# Patient Record
Sex: Male | Born: 1940 | Race: White | Hispanic: No | Marital: Married | Smoking: Former smoker
Health system: Southern US, Community
[De-identification: ages and names within clinical notes are randomized; demographics above are authoritative.]

## PROBLEM LIST (undated history)

## (undated) DIAGNOSIS — C801 Malignant (primary) neoplasm, unspecified: Secondary | ICD-10-CM

## (undated) DIAGNOSIS — I509 Heart failure, unspecified: Secondary | ICD-10-CM

## (undated) DIAGNOSIS — C679 Malignant neoplasm of bladder, unspecified: Secondary | ICD-10-CM

## (undated) DIAGNOSIS — J45909 Unspecified asthma, uncomplicated: Secondary | ICD-10-CM

## (undated) DIAGNOSIS — C4491 Basal cell carcinoma of skin, unspecified: Secondary | ICD-10-CM

## (undated) HISTORY — PX: OTHER SURGICAL HISTORY: SHX169

## (undated) HISTORY — DX: Basal cell carcinoma of skin, unspecified: C44.91

---

## 2008-12-20 DIAGNOSIS — C4491 Basal cell carcinoma of skin, unspecified: Secondary | ICD-10-CM

## 2008-12-20 HISTORY — DX: Basal cell carcinoma of skin, unspecified: C44.91

## 2013-10-02 ENCOUNTER — Ambulatory Visit: Payer: Self-pay | Admitting: Gastroenterology

## 2013-10-04 LAB — PATHOLOGY REPORT

## 2014-04-19 ENCOUNTER — Emergency Department: Payer: Self-pay | Admitting: Emergency Medicine

## 2014-04-19 LAB — BASIC METABOLIC PANEL
ANION GAP: 8 (ref 7–16)
BUN: 15 mg/dL (ref 7–18)
CHLORIDE: 105 mmol/L (ref 98–107)
Calcium, Total: 8.2 mg/dL — ABNORMAL LOW (ref 8.5–10.1)
Co2: 22 mmol/L (ref 21–32)
Creatinine: 0.99 mg/dL (ref 0.60–1.30)
EGFR (African American): >60
Glucose: 150 mg/dL — ABNORMAL HIGH (ref 65–99)
Osmolality: 274 (ref 275–301)
Potassium: 3.9 mmol/L (ref 3.5–5.1)
Sodium: 135 mmol/L — ABNORMAL LOW (ref 136–145)

## 2014-04-19 LAB — CBC WITH DIFFERENTIAL/PLATELET
Basophil #: 0.1 10*3/uL (ref 0.0–0.1)
Basophil %: 1.1 %
EOS PCT: 0.8 %
Eosinophil #: 0.1 10*3/uL (ref 0.0–0.7)
HCT: 44.3 % (ref 40.0–52.0)
HGB: 14.3 g/dL (ref 13.0–18.0)
Lymphocyte #: 1.1 10*3/uL (ref 1.0–3.6)
Lymphocyte %: 14.7 %
MCH: 33.1 pg (ref 26.0–34.0)
MCHC: 32.3 g/dL (ref 32.0–36.0)
MCV: 103 fL — AB (ref 80–100)
Monocyte #: 0.4 x10 3/mm (ref 0.2–1.0)
Monocyte %: 5.3 %
NEUTROS PCT: 78.1 %
Neutrophil #: 5.7 10*3/uL (ref 1.4–6.5)
Platelet: 156 10*3/uL (ref 150–440)
RBC: 4.33 10*6/uL — AB (ref 4.40–5.90)
RDW: 13.2 % (ref 11.5–14.5)
WBC: 7.4 10*3/uL (ref 3.8–10.6)

## 2015-08-20 ENCOUNTER — Observation Stay: Payer: Medicare HMO

## 2015-08-20 ENCOUNTER — Inpatient Hospital Stay
Admission: EM | Admit: 2015-08-20 | Discharge: 2015-08-22 | DRG: 064 | Disposition: A | Discharge status: Home / Self Care | Payer: Medicare HMO | Attending: Internal Medicine | Admitting: Internal Medicine

## 2015-08-20 ENCOUNTER — Emergency Department: Payer: Medicare HMO

## 2015-08-20 ENCOUNTER — Encounter: Payer: Self-pay | Admitting: Emergency Medicine

## 2015-08-20 DIAGNOSIS — R410 Disorientation, unspecified: Secondary | ICD-10-CM

## 2015-08-20 DIAGNOSIS — Z87891 Personal history of nicotine dependence: Secondary | ICD-10-CM

## 2015-08-20 DIAGNOSIS — I5189 Other ill-defined heart diseases: Secondary | ICD-10-CM | POA: Diagnosis present

## 2015-08-20 DIAGNOSIS — I10 Essential (primary) hypertension: Secondary | ICD-10-CM | POA: Diagnosis present

## 2015-08-20 DIAGNOSIS — G9340 Encephalopathy, unspecified: Secondary | ICD-10-CM | POA: Diagnosis present

## 2015-08-20 DIAGNOSIS — G459 Transient cerebral ischemic attack, unspecified: Secondary | ICD-10-CM

## 2015-08-20 DIAGNOSIS — I639 Cerebral infarction, unspecified: Secondary | ICD-10-CM | POA: Diagnosis not present

## 2015-08-20 DIAGNOSIS — I63412 Cerebral infarction due to embolism of left middle cerebral artery: Principal | ICD-10-CM | POA: Diagnosis present

## 2015-08-20 DIAGNOSIS — R29701 NIHSS score 1: Secondary | ICD-10-CM | POA: Diagnosis present

## 2015-08-20 DIAGNOSIS — Z85828 Personal history of other malignant neoplasm of skin: Secondary | ICD-10-CM

## 2015-08-20 DIAGNOSIS — I447 Left bundle-branch block, unspecified: Secondary | ICD-10-CM | POA: Diagnosis present

## 2015-08-20 DIAGNOSIS — Z7982 Long term (current) use of aspirin: Secondary | ICD-10-CM

## 2015-08-20 DIAGNOSIS — E785 Hyperlipidemia, unspecified: Secondary | ICD-10-CM | POA: Diagnosis present

## 2015-08-20 DIAGNOSIS — R4701 Aphasia: Secondary | ICD-10-CM | POA: Diagnosis not present

## 2015-08-20 DIAGNOSIS — I493 Ventricular premature depolarization: Secondary | ICD-10-CM | POA: Diagnosis present

## 2015-08-20 HISTORY — DX: Unspecified asthma, uncomplicated: J45.909

## 2015-08-20 HISTORY — DX: Malignant (primary) neoplasm, unspecified: C80.1

## 2015-08-20 LAB — COMPREHENSIVE METABOLIC PANEL
ALBUMIN: 4.2 g/dL (ref 3.5–5.0)
ALT: 18 U/L (ref 17–63)
AST: 29 U/L (ref 15–41)
Alkaline Phosphatase: 45 U/L (ref 38–126)
Anion gap: 5 (ref 5–15)
BUN: 17 mg/dL (ref 6–20)
CHLORIDE: 105 mmol/L (ref 101–111)
CO2: 28 mmol/L (ref 22–32)
CREATININE: 0.93 mg/dL (ref 0.61–1.24)
Calcium: 8.8 mg/dL — ABNORMAL LOW (ref 8.9–10.3)
GFR calc Af Amer: >60 mL/min (ref >60)
GFR calc non Af Amer: >60 mL/min (ref >60)
Glucose, Bld: 96 mg/dL (ref 65–99)
Potassium: 4.1 mmol/L (ref 3.5–5.1)
SODIUM: 138 mmol/L (ref 135–145)
TOTAL PROTEIN: 7.7 g/dL (ref 6.5–8.1)
Total Bilirubin: 1 mg/dL (ref 0.3–1.2)

## 2015-08-20 LAB — GLUCOSE, CAPILLARY: Glucose-Capillary: 105 mg/dL — ABNORMAL HIGH (ref 65–99)

## 2015-08-20 LAB — CBC
HEMATOCRIT: 41.7 % (ref 40.0–52.0)
Hemoglobin: 13.9 g/dL (ref 13.0–18.0)
MCH: 32.7 pg (ref 26.0–34.0)
MCHC: 33.4 g/dL (ref 32.0–36.0)
MCV: 98 fL (ref 80.0–100.0)
PLATELETS: 145 10*3/uL — AB (ref 150–440)
RBC: 4.26 MIL/uL — ABNORMAL LOW (ref 4.40–5.90)
RDW: 14.2 % (ref 11.5–14.5)
WBC: 8.2 10*3/uL (ref 3.8–10.6)

## 2015-08-20 LAB — DIFFERENTIAL
BASOS ABS: 0.1 10*3/uL (ref 0–0.1)
BASOS PCT: 1 %
Eosinophils Absolute: 0.2 10*3/uL (ref 0–0.7)
Eosinophils Relative: 3 %
Lymphocytes Relative: 7 %
Lymphs Abs: 0.5 10*3/uL — ABNORMAL LOW (ref 1.0–3.6)
Monocytes Absolute: 0.7 10*3/uL (ref 0.2–1.0)
Monocytes Relative: 8 %
NEUTROS ABS: 6.7 10*3/uL — AB (ref 1.4–6.5)
NEUTROS PCT: 81 %

## 2015-08-20 LAB — PROTIME-INR
INR: 1.06
PROTHROMBIN TIME: 14 s (ref 11.4–15.0)

## 2015-08-20 LAB — TROPONIN I: Troponin I: <0.03 ng/mL (ref <0.031)

## 2015-08-20 LAB — APTT: APTT: 26 s (ref 24–36)

## 2015-08-20 MED ORDER — ASPIRIN 300 MG RE SUPP: 300.0000 mg | Freq: Every day | RECTAL | Status: DC

## 2015-08-20 MED ORDER — ASPIRIN 325 MG PO TABS
325.0000 mg | ORAL_TABLET | Freq: Every day | ORAL | Status: DC
  Administered 2015-08-20 – 2015-08-22 (×3): 325 mg via ORAL
  Filled 2015-08-20 (×3): qty 1

## 2015-08-20 MED ORDER — STROKE: EARLY STAGES OF RECOVERY BOOK
Freq: Once | Status: AC
  Administered 2015-08-20: 22:00:00

## 2015-08-20 MED ORDER — ACETAMINOPHEN 325 MG PO TABS
650.0000 mg | ORAL_TABLET | Freq: Four times a day (QID) | ORAL | Status: DC | PRN
  Administered 2015-08-20 – 2015-08-22 (×4): 650 mg via ORAL
  Filled 2015-08-20 (×4): qty 2

## 2015-08-20 MED ORDER — ENOXAPARIN SODIUM 40 MG/0.4ML ~~LOC~~ SOLN
40.0000 mg | SUBCUTANEOUS | Status: DC
  Administered 2015-08-20 – 2015-08-21 (×2): 40 mg via SUBCUTANEOUS
  Filled 2015-08-20 (×2): qty 0.4

## 2015-08-20 MED ORDER — SENNOSIDES-DOCUSATE SODIUM 8.6-50 MG PO TABS: 1.0000 | ORAL_TABLET | Freq: Every evening | ORAL | Status: DC | PRN

## 2015-08-20 MED ORDER — IOHEXOL 350 MG/ML SOLN
75.0000 mL | Freq: Once | INTRAVENOUS | Status: AC | PRN
  Administered 2015-08-20: 75 mL via INTRAVENOUS

## 2015-08-20 NOTE — ED Notes (Signed)
Wife reports when pharm tech asked if he was allergic to anything he kept telling her he did not want any medication.  This was only time he has not seemed to understand since arriving to hospital. md notified.

## 2015-08-20 NOTE — ED Notes (Signed)
Pt to ed with c/o confusion and disorientation since becoming dizzy and falling in [employer] at 1030 today.  Pt states he was standing looking at cards and then became dizzy and fell.  Pt reports he did not hit his head and did not lose consciousness but since has had confusion and disorientation.  Pt denies weakness, denies headache. Denies slurred speech.  Pt states "I feel dull in my brain"

## 2015-08-20 NOTE — ED Notes (Signed)
Pt arrived to room

## 2015-08-20 NOTE — ED Notes (Signed)
1925 to MRI via stretcher

## 2015-08-20 NOTE — ED Notes (Signed)
Neurology at bedside.

## 2015-08-20 NOTE — ED Notes (Signed)
Dr Doy Mince at bedside to update pt

## 2015-08-20 NOTE — ED Notes (Signed)
Returned from CTA with RN and monitor.

## 2015-08-20 NOTE — Consult Note (Signed)
Referring Physician: Burlene Arnt    Chief Complaint: Dizziness, difficulty with speech  HPI: Dalton Obrien is an 75 y.o. male who reports that he was in [employer] today and had the acute onset of dizziness.  He fell to the floor but did not hit his head.  He was able to check out of the store and drive home but noted when he was home that he could not read or sing his cards correctly.  His wife noted that he was confused as well.  Patient presented for evaluation at that time.  Date last known well: 08/20/2015 Time last known well: Time: 10:30 tPA Given: No: Improving symptoms  Past Medical History  Diagnosis Date  . Asthma   . 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 No pertinent past surgical history.  Family history: Mother deceased from breast cancer.  Had a history of migraines as well.  Brother with h/o DVT.  Son with migraines.  Social History:  reports that he has never smoked. He does not have any smokeless tobacco history on file. He reports that he drinks alcohol. He reports that he does not use illicit drugs.  Allergies: No Known Allergies  Medications: I have reviewed the patient's current medications. Prior to Admission:  Prior to Admission medications   ASA 81mg  daily    ROS: History obtained from the patient  General ROS: negative for - chills, fatigue, fever, night sweats, weight gain or weight loss Psychological ROS: negative for - behavioral disorder, hallucinations, memory difficulties, mood swings or suicidal ideation Ophthalmic ROS: negative for - blurry vision, double vision, eye pain or loss of vision ENT ROS: negative for - epistaxis, nasal discharge, oral lesions, sore throat, tinnitus or vertigo Allergy and Immunology ROS: negative for - hives or itchy/watery eyes Hematological and Lymphatic ROS: negative for - bleeding problems, bruising or swollen lymph  nodes Endocrine ROS: negative for - galactorrhea, hair pattern changes, polydipsia/polyuria or temperature intolerance Respiratory ROS: negative for - cough, hemoptysis, shortness of breath or wheezing Cardiovascular ROS: negative for - chest pain, dyspnea on exertion, edema or irregular heartbeat Gastrointestinal ROS: negative for - abdominal pain, diarrhea, hematemesis, nausea/vomiting or stool incontinence Genito-Urinary ROS: negative for - dysuria, hematuria, incontinence or urinary frequency/urgency Musculoskeletal ROS: negative for - joint swelling or muscular weakness Neurological ROS: as noted in HPI Dermatological ROS: negative for rash and skin lesion changes  Physical Examination: Blood pressure 108/80, pulse 78, temperature 98.2 F (36.8 C), temperature source Oral, resp. rate 20, height 6' (1.829 m), weight 81.647 kg (180 lb), SpO2 96 %.  HEENT-  Normocephalic, no lesions, without obvious abnormality.  Normal external eye and conjunctiva.  Normal TM's bilaterally.  Normal auditory canals and external ears. Normal external nose, mucus membranes and septum.  Normal pharynx. Cardiovascular- S1, S2 normal, pulses palpable throughout   Lungs- chest clear, no wheezing, rales, normal symmetric air entry Abdomen- soft, non-tender; bowel sounds normal; no masses,  no organomegaly Extremities- no edema Lymph-no adenopathy palpable Musculoskeletal-no joint tenderness, deformity or swelling Skin-warm and dry, no hyperpigmentation, vitiligo, or suspicious lesions  Neurological Examination Mental Status: Alert, oriented, thought content appropriate.  Speech with some word finding and paraphasic errors.  Able to follow 3 step commands without difficulty. Cranial Nerves: II: Discs flat bilaterally; Visual fields grossly normal, pupils equal, round, reactive to light and accommodation III,IV, VI: ptosis not present, extra-ocular motions intact  bilaterally V,VII: smile symmetric, facial light  touch sensation normal bilaterally VIII: hearing normal bilaterally IX,X: gag reflex present XI: bilateral shoulder shrug XII: midline tongue extension Motor: Right : Upper extremity   5/5    Left:     Upper extremity   5/5  Lower extremity   5/5     Lower extremity   5/5 Tone and bulk:normal tone throughout; no atrophy noted Sensory: Pinprick and light touch intact throughout, bilaterally Deep Tendon Reflexes: 2+ and symmetric with absent AJ's bilaterally Plantars: Right: equivocal   Left: upgoing Cerebellar: Normal finger-to-nose and normal heel-to-shin testing bilaterally Gait: guarded   Laboratory Studies:  Basic Metabolic Panel: No results for input(s): NA, K, CL, CO2, GLUCOSE, BUN, CREATININE, CALCIUM, MG, PHOS in the last 168 hours.  Liver Function Tests: No results for input(s): AST, ALT, ALKPHOS, BILITOT, PROT, ALBUMIN in the last 168 hours. No results for input(s): LIPASE, AMYLASE in the last 168 hours. No results for input(s): AMMONIA in the last 168 hours.  CBC:  Recent Labs Lab 08/20/15 1306  WBC 8.2  NEUTROABS 6.7*  HGB 13.9  HCT 41.7  MCV 98.0  PLT 145*    Cardiac Enzymes: No results for input(s): CKTOTAL, CKMB, CKMBINDEX, TROPONINI in the last 168 hours.  BNP: Invalid input(s): POCBNP  CBG:  Recent Labs Lab 08/20/15 [place]    Microbiology: No results found for this or any previous visit.  Coagulation Studies:  Recent Labs  08/20/15 1306  LABPROT 14.0  INR 1.06    Urinalysis: No results for input(s): COLORURINE, LABSPEC, PHURINE, GLUCOSEU, HGBUR, BILIRUBINUR, KETONESUR, PROTEINUR, UROBILINOGEN, NITRITE, LEUKOCYTESUR in the last 168 hours.  Invalid input(s): APPERANCEUR  Lipid Panel: No results found for: CHOL, TRIG, HDL, CHOLHDL, VLDL, LDLCALC  HgbA1C: No results found for: HGBA1C  Urine Drug Screen:  No results found for: LABOPIA, COCAINSCRNUR, LABBENZ, AMPHETMU, THCU, LABBARB  Alcohol Level: No results for  input(s): ETH in the last 168 hours.  Other results: EKG: sinus rhythm at 88 bpm, paired PVC's, prolonged PR interval.    Imaging: Ct Head Wo Contrast  08/20/2015  CLINICAL DATA:  Dizziness, confusion, altered mental status beginning this morning, fell prior to arrival, code stroke, initial encounter EXAM: CT HEAD WITHOUT CONTRAST TECHNIQUE: Contiguous axial images were obtained from the base of the skull through the vertex without intravenous contrast. COMPARISON:  04/19/2014 FINDINGS: Mild generalized age-related atrophy. Stable ventricular morphology. No midline shift or mass effect. Otherwise normal appearance of brain parenchyma. No intracranial hemorrhage, mass lesion or evidence acute infarction. No extra-axial fluid collections. Visualized paranasal sinuses and mastoid air cells clear. No acute osseous abnormalities. IMPRESSION: No acute intracranial abnormalities. Findings called to Dr. Corky Downs on 08/20/2015 at 1310 hours. Electronically Signed   By: Lavonia Dana M.D.   On: 08/20/2015 13:11    Assessment: 75 y.o. male presenting with aphasia and dizziness.  Dizziness resolved but patient continues to have some mild speech issues.  Symptoms resolving and therefore patient not a tPA candidate.  Head CT personally reviewed and shows no acute changes.  Patient on ASA at home.  Further work up recommended.    Stroke Risk Factors - hypertension  Plan: 1. HgbA1c, fasting lipid panel 2. MRI of the brain without contrast 3. PT consult, OT consult, Speech consult 4. Echocardiogram 5. CTA of head and neck.   6. Prophylactic therapy-Antiplatelet med: Aspirin - dose 325mg  daily 7. NPO until RN stroke swallow screen 8. Telemetry monitoring 9. Frequent neuro checks  Alexis Goodell, MD Neurology [phone] 08/20/2015, 1:45 PM

## 2015-08-20 NOTE — ED Notes (Signed)
Pt currently in CT. Stroke RN coordinator at bedside with primary rn waiting for pt.

## 2015-08-20 NOTE — Progress Notes (Signed)
Pt unable to follow commands and does not understand how to respond when questions are asked. Pt and pt wife refuses to let staff stay in room while pt is using urinal standing at bedside. Educated pt and pt wife about fall risk.

## 2015-08-20 NOTE — H&P (Signed)
[facility] [place] NAME: Dalton Obrien    MR#:  [id number]  DATE OF BIRTH:  [birth year]  DATE OF ADMISSION:  08/20/2015  PRIMARY CARE PHYSICIAN: Juluis Pitch, MD   REQUESTING/REFERRING PHYSICIAN: Dr. Burlene Arnt  CHIEF COMPLAINT:   Chief Complaint  Patient presents with  . Fall  . Altered Mental Status    HISTORY OF PRESENT ILLNESS:   Dalton Obrien  is a 75 y.o. male with no significant medical history who presents from home with confusion. Patient was in his usual state of health until 10:30AM when he [facility] to [facility] to buy [facility] card for his wife, while there he became unsteady and confused; he was able to drive home but confusion persisted, this was constant, moderate severity, associated with word finding difficulty; no alleviating factors. In ED he is noted to have some word finding difficulty as well as some inability to understand/interprete questions that are asked. He denies headaches, vision changes, paresthesias, focal extremity weakness.  He reports that that he was previously on a beta blocker but this was stopped due to baseline low/normal blood pressures; the medication was lowering his blood pressure further so it was discontinued.   PAST MEDICAL HISTORY:   Past Medical History  Diagnosis Date  . Asthma   . 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 No pertinent past surgical history.  SOCIAL HISTORY:   Social History  Substance Use Topics  . Smoking status: Former Research scientist (life sciences)  . Smokeless tobacco: Not on file     Comment: Quit in 1980  . Alcohol Use: 0.6 oz/week    1 Glasses of wine per week     Comment: 1-1.5 glasses of wine daily    FAMILY HISTORY:   Family History  Problem Relation Age of Onset  . Stroke Mother   . Stroke Maternal Uncle   . Seizures Father   . Hypertension Brother     DRUG ALLERGIES:   Allergies  Allergen Reactions  .  Penicillins Other (See Comments)    Urinary tract problems  . Pyridium [Phenazopyridine Hcl] Other (See Comments)    REVIEW OF SYSTEMS:   Review of Systems  Constitutional: Negative for fever, weight loss and malaise/fatigue.  HENT: Negative for congestion, hearing loss and tinnitus.   Eyes: Negative for blurred vision and double vision.  Respiratory: Negative for cough and shortness of breath.   Cardiovascular: Negative for chest pain and palpitations.  Gastrointestinal: Negative for nausea, vomiting and abdominal pain.  Genitourinary: Negative for dysuria, urgency and frequency.  Musculoskeletal: Positive for falls. Negative for myalgias, back pain and joint pain.  Skin: Negative for itching and rash.  Neurological: Positive for speech change. Negative for dizziness, tingling, tremors, sensory change, focal weakness, seizures, loss of consciousness and headaches.  Psychiatric/Behavioral: Negative for suicidal ideas and substance abuse. The patient does not have insomnia.     MEDICATIONS AT HOME:   Prior to Admission medications   Medication Sig Start Date End Date Taking? Authorizing Provider  aspirin EC 81 MG tablet Take 81 mg by mouth daily.   Yes Historical Provider, MD  desonide (DESOWEN) 0.05 % lotion Apply 1 application topically every other day.   Yes Historical Provider, MD      VITAL SIGNS:  Blood pressure 114/78, pulse 84, temperature 98.2 F (36.8 C), temperature source Oral, resp. rate 22, height 6' (1.829 m), weight 81.647 kg (  180 lb), SpO2 95 %.  PHYSICAL EXAMINATION:  Physical Exam  GENERAL:  75 y.o.-year-old patient lying in the bed with no acute distress.  EYES: Pupils equal, round, reactive to light and accommodation. No scleral icterus. Extraocular muscles intact.  HEENT: Head atraumatic, normocephalic. Oropharynx and nasopharynx clear. No oropharyngeal erythema, moist oral mucosa  NECK:  Supple, no jugular venous distention. No thyroid enlargement, no  tenderness.  LUNGS: Normal breath sounds bilaterally, no wheezing, rales, rhonchi. No use of accessory muscles of respiration.  CARDIOVASCULAR: S1, S2 normal. No murmurs, rubs, or gallops.  ABDOMEN: Soft, nontender, nondistended. Bowel sounds present. No organomegaly or mass.  EXTREMITIES: No pedal edema, cyanosis, or clubbing. + 2 pedal & radial pulses b/l.   NEUROLOGIC: Cranial nerves II through XII are intact. No focal Motor or sensory deficits appreciated b/l. Some word finding difficulty notes PSYCHIATRIC: The patient is alert and oriented x 3. Good affect.  SKIN: No obvious rash, lesion, or ulcer.   LABORATORY PANEL:   CBC  Recent Labs Lab 08/20/15 1306  WBC 8.2  HGB 13.9  HCT 41.7  PLT 145*   ------------------------------------------------------------------------------------------------------------------  Chemistries   Recent Labs Lab 08/20/15 1306  NA 138  K 4.1  CL 105  CO2 28  GLUCOSE 96  BUN 17  CREATININE 0.93  CALCIUM 8.8*  AST 29  ALT 18  ALKPHOS 45  BILITOT 1.0   ------------------------------------------------------------------------------------------------------------------  Cardiac Enzymes  Recent Labs Lab 08/20/15 1306  TROPONINI <0.03   ------------------------------------------------------------------------------------------------------------------  EKG viewed by me NSR: 88 bpm with PVCs  Telemetry viewed by me: NSR with intermittent PVCs  RADIOLOGY:   CT Angio Head and Neck 1. Atherosclerotic calcifications at the carotid bifurcations bilaterally, left greater than right. There is no significant associated stenosis. 2. Marked tortuosity of the cervical right ICA without significant stenosis. 3. Atherosclerotic changes within the cavernous internal carotid arteries bilaterally. 4. Moderate distal small vessel disease bilaterally without a significant proximal stenosis, aneurysm, or branch vessel occlusion  CT Head Wo  Contrast No acute intracranial abnormalities.  IMPRESSION AND PLAN:   75 year old male with no significant past medical history presenting with acute onset of encephalopathy, confusion, concerning for TIA, noted to have PVC on EKG  PLAN Appreciate neurology input, will check hemoglobin A1C, fasting lipid panel, MRI brain, echo and place patient on telemetry. PT/OT/ST consults Neurchecks Start ASA 325mg  If lipid uncontrolled start statin  Per wife, Dalton Obrien [phone]), patient has a living will, he will be FULL CODE during this hospitalization.  All the records are reviewed and case discussed with ED provider. Management plans discussed with the patient, family and they are in agreement.  CODE STATUS:  FULL CODE  TOTAL TIME TAKING CARE OF THIS PATIENT: 58 mies.    Samson Frederic D.O on 08/20/2015 at 4:02 PM  After 6pm go to [email or web address] - password [email or web address]  Office  [phone]  CC: Primary care physician; Juluis Pitch, MD   Note: This dictation was prepared with Dragon dictation along with smaller phrase technology. Any transcriptional errors that result from this process are unintentional.

## 2015-08-20 NOTE — ED Provider Notes (Signed)
[facility] Emergency Department Provider Note  ____________________________________________   I have reviewed the triage vital signs and the nursing notes.   HISTORY  Chief Complaint Fall and Altered Mental Status    HPI Dalton Obrien is a 75 y.o. 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  At around 10:30 this morning, patient went to the grocery store he was in his normal state of health at that time. Well the grocery store he began to "list" to one side and he actually very gradually let himself down on his hands and knees because he felt unsteady. He did not hit his head or pass out. Subsequent to that event, patient remained with a mildly unsteady gait and had word finding difficulties. For example, he accidentally put his name on the carpet for his wife. This was detected by him and his wife is being unusual, and he was brought to the emergency room. He was a code stroke here. He states he has had a great improvement in his symptoms and feels baseline or nearly so at this time. Denies any focal numbness or weakness denies headache denies stiff neck denies head trauma denies vomiting, denies change in vision,  History reviewed. No pertinent past medical history.  There are no active problems to display for this patient.   History reviewed. No pertinent past surgical history.  No current outpatient prescriptions on file.  Allergies Review of patient's allergies indicates no known allergies.  No family history on file.  Social History Social History  Substance Use Topics  . Smoking status: Never Smoker   . Smokeless tobacco: None  . Alcohol Use: Yes    Review of Systems Constitutional: No fever/chills Eyes: No visual  changes. ENT: No sore throat. No stiff neck no neck pain Cardiovascular: Denies chest pain. Respiratory: Denies shortness of breath. Gastrointestinal:   no vomiting.  No diarrhea.  No constipation. Genitourinary: Negative for dysuria. Musculoskeletal: Negative lower extremity swelling Skin: Negative for rash. Neurological: See history of present illness otherwise negative 10-point ROS otherwise negative.  ____________________________________________   PHYSICAL EXAM:  VITAL SIGNS: ED Triage Vitals  Enc Vitals Group     BP 08/20/15 1252 108/80 mmHg     Pulse Rate 08/20/15 1252 78     Resp 08/20/15 1252 20     Temp 08/20/15 1252 98.2 F (36.8 C)     Temp Source 08/20/15 1252 Oral     SpO2 08/20/15 1252 96 %     Weight 08/20/15 1252 180 lb (81.647 kg)     Height 08/20/15 1252 6' (1.829 m)     Head Cir --      Peak Flow --      Pain Score 08/20/15 1253 0     Pain Loc --      Pain Edu? --      Excl. in [place]? --     Constitutional: Alert and oriented. Well appearing . Laughing and joking with me in no acute medical distress Eyes: Conjunctivae are normal. PERRL. EOMI. Head: Atraumatic. Nose: No congestion/rhinnorhea. Mouth/Throat: Mucous membranes are moist.  Oropharynx non-erythematous. Neck: No stridor.   Nontender with no meningismus Cardiovascular: Normal rate, regular rhythm. Grossly normal heart sounds.  Good peripheral circulation. Respiratory: Normal respiratory effort.  No retractions. Lungs CTAB. Abdominal: Soft and nontender. No distention. No guarding no rebound Back:  There is no focal tenderness or step off there is no midline tenderness there are no lesions noted. there is no CVA tenderness Musculoskeletal: No lower extremity tenderness. No joint effusions, no DVT signs strong distal pulses no edema Neurologic:  Cranial nerves II through XII are grossly intact 5 out of 5 strength bilateral upper and lower extremity. Finger to nose within normal limits heel to shin  within normal limits, speech is normal with no word finding difficulty or dysarthria, reflexes symmetric, pupils are equally round and reactive to light, there is no pronator drift, sensation is normal, vision is intact to confrontation, gait is deferred, there is no nystagmus, normal neurologic exam, once or twice patient did pick the wrong words so the stroke scale from neurologist was a one. Skin:  Skin is warm, dry and intact. No rash noted. Psychiatric: Mood and affect are normal. Speech and behavior are normal.  ____________________________________________   LABS (all labs ordered are listed, but only abnormal results are displayed)  Labs Reviewed  CBC - Abnormal; Notable for the following:    RBC 4.26 (*)    Platelets 145 (*)    All other components within normal limits  DIFFERENTIAL - Abnormal; Notable for the following:    Neutro Abs 6.7 (*)    Lymphs Abs 0.5 (*)    All other components within normal limits  GLUCOSE, CAPILLARY - Abnormal; Notable for the following:    Glucose-Capillary 105 (*)    All other components within normal limits  PROTIME-INR  APTT  COMPREHENSIVE METABOLIC PANEL  TROPONIN I  CBG MONITORING, ED   ____________________________________________  EKG  I personally interpreted any EKGs ordered by me or triage Left bundle-branch branch block, rate 88 bpm, is normal sinus, PVCs noted, no acute ischemic changes ____________________________________________  RADIOLOGY  I reviewed any imaging ordered by me or triage that were performed during my shift ____________________________________________   PROCEDURES  Procedure(s) performed: None  Critical Care performed: None  ____________________________________________   INITIAL IMPRESSION / ASSESSMENT AND PLAN / ED COURSE  Pertinent labs & imaging results that were available during my care of the patient were reviewed by me and considered in my medical decision making (see chart for  details). Patient at this time has no evidence of cerebellar findings, he has no evidence of significant CVA. He did have occasional difficulty finding a word but by and large was very conversant. CT scan is negative. Neurologist at the bedside does not feel he is a candidate for TPA. I agree with this assessment. Very low NIH scale and rapidly resolving symptoms. Patient and family are very comfortable with this plan as well. We will obtain a CTA after renal function in consultation with our neurologist. Burnis Medin continue to observe the patient very closely. Very reassuring exam.  ____________________________________________   FINAL CLINICAL IMPRESSION(S) / ED DIAGNOSES  Final diagnoses:  Aphasia  Aphasia      This chart was dictated using voice recognition software.  Despite best efforts to proofread,  errors can occur which can change meaning.     Schuyler Amor, MD 08/20/15 1332

## 2015-08-21 ENCOUNTER — Observation Stay
Admit: 2015-08-21 | Discharge: 2015-08-21 | Disposition: A | Discharge status: Home / Self Care | Payer: Medicare HMO | Attending: Internal Medicine | Admitting: Internal Medicine

## 2015-08-21 ENCOUNTER — Observation Stay: Payer: Medicare HMO

## 2015-08-21 DIAGNOSIS — I10 Essential (primary) hypertension: Secondary | ICD-10-CM | POA: Diagnosis present

## 2015-08-21 DIAGNOSIS — E785 Hyperlipidemia, unspecified: Secondary | ICD-10-CM | POA: Diagnosis present

## 2015-08-21 DIAGNOSIS — Z87891 Personal history of nicotine dependence: Secondary | ICD-10-CM | POA: Diagnosis not present

## 2015-08-21 DIAGNOSIS — Z85828 Personal history of other malignant neoplasm of skin: Secondary | ICD-10-CM | POA: Diagnosis not present

## 2015-08-21 DIAGNOSIS — I447 Left bundle-branch block, unspecified: Secondary | ICD-10-CM | POA: Diagnosis present

## 2015-08-21 DIAGNOSIS — I639 Cerebral infarction, unspecified: Secondary | ICD-10-CM | POA: Diagnosis present

## 2015-08-21 DIAGNOSIS — G9340 Encephalopathy, unspecified: Secondary | ICD-10-CM | POA: Diagnosis present

## 2015-08-21 DIAGNOSIS — R29701 NIHSS score 1: Secondary | ICD-10-CM | POA: Diagnosis present

## 2015-08-21 DIAGNOSIS — Z7982 Long term (current) use of aspirin: Secondary | ICD-10-CM | POA: Diagnosis not present

## 2015-08-21 DIAGNOSIS — I63412 Cerebral infarction due to embolism of left middle cerebral artery: Principal | ICD-10-CM

## 2015-08-21 DIAGNOSIS — I5189 Other ill-defined heart diseases: Secondary | ICD-10-CM | POA: Diagnosis present

## 2015-08-21 DIAGNOSIS — R4701 Aphasia: Secondary | ICD-10-CM | POA: Diagnosis present

## 2015-08-21 DIAGNOSIS — I493 Ventricular premature depolarization: Secondary | ICD-10-CM | POA: Diagnosis present

## 2015-08-21 LAB — LIPID PANEL
CHOL/HDL RATIO: 3.6 ratio
Cholesterol: 165 mg/dL (ref 0–200)
HDL: 46 mg/dL (ref >40)
LDL Cholesterol: 110 mg/dL — ABNORMAL HIGH (ref 0–99)
Triglycerides: 44 mg/dL (ref <150)
VLDL: 9 mg/dL (ref 0–40)

## 2015-08-21 LAB — CBC
HEMATOCRIT: 39.5 % — AB (ref 40.0–52.0)
Hemoglobin: 13.4 g/dL (ref 13.0–18.0)
MCH: 33.2 pg (ref 26.0–34.0)
MCHC: 34 g/dL (ref 32.0–36.0)
MCV: 97.6 fL (ref 80.0–100.0)
PLATELETS: 118 10*3/uL — AB (ref 150–440)
RBC: 4.05 MIL/uL — ABNORMAL LOW (ref 4.40–5.90)
RDW: 14.2 % (ref 11.5–14.5)
WBC: 5.3 10*3/uL (ref 3.8–10.6)

## 2015-08-21 LAB — HEMOGLOBIN A1C: HEMOGLOBIN A1C: 4.9 % (ref 4.0–6.0)

## 2015-08-21 MED ORDER — ATORVASTATIN CALCIUM 20 MG PO TABS
20.0000 mg | ORAL_TABLET | Freq: Every day | ORAL | Status: DC
  Administered 2015-08-21: 17:00:00 20 mg via ORAL
  Filled 2015-08-21: qty 1

## 2015-08-21 NOTE — Plan of Care (Signed)
Problem: Self-Care: Goal: Ability to communicate needs accurately will improve Outcome: Not Progressing No change in speech or pt understanding when asked questions or to follow commands. Emotional support given, pt understands something "isn't quite right"

## 2015-08-21 NOTE — Progress Notes (Signed)
[place] at [facility]                                                                                                                                                                                            Patient Demographics   Dalton Obrien, is a 75 y.o. male, DOB - 04/11/1941, [id number]  Admit date - 08/20/2015   Admitting Physician Samson Frederic, DO  Outpatient Primary MD for the patient is Dalton Lovie Macadamia, MD   LOS -   Subjective: Patient having difficulty with his speech unable to express what he wants to say.     Review of Systems:   CONSTITUTIONAL: No documented fever. No fatigue, weakness. No weight gain, no weight loss.  EYES: No blurry or double vision.  ENT: No tinnitus. No postnasal drip. No redness of the oropharynx.  RESPIRATORY: No cough, no wheeze, no hemoptysis. No dyspnea.  CARDIOVASCULAR: No chest pain. No orthopnea. No palpitations. No syncope.  GASTROINTESTINAL: No nausea, no vomiting or diarrhea. No abdominal pain. No melena or hematochezia.  GENITOURINARY: No dysuria or hematuria.  ENDOCRINE: No polyuria or nocturia. No heat or cold intolerance.  HEMATOLOGY: No anemia. No bruising. No bleeding.  INTEGUMENTARY: No rashes. No lesions.  MUSCULOSKELETAL: No arthritis. No swelling. No gout.  NEUROLOGIC: Having expressive aphasia PSYCHIATRIC: No anxiety. No insomnia. No ADD.    Vitals:   Filed Vitals:   08/21/15 0154 08/21/15 0410 08/21/15 0520 08/21/15 0530  BP: 97/56 100/60 86/51 94/62   Pulse: 77 78 73   Temp: 98.4 F (36.9 C) 98.5 F (36.9 C) 99 F (37.2 C)   TempSrc: Oral Oral Oral   Resp: 18 20 19    Height:      Weight:      SpO2: 93% 94% 91%     Wt Readings from Last 3 Encounters:  08/20/15 85.231 kg (187 lb 14.4 oz)     Intake/Output Summary (Last 24 hours) at 08/21/15 1310 Last data filed at 08/21/15 1246  Gross per 24 hour  Intake    240 ml  Output    400 ml  Net   -160 ml     Physical Exam:   GENERAL: Pleasant-appearing in no apparent distress.  HEAD, EYES, EARS, NOSE AND THROAT: Atraumatic, normocephalic. Extraocular muscles are intact. Pupils equal and reactive to light. Sclerae anicteric. No conjunctival injection. No oro-pharyngeal erythema.  NECK: Supple. There is no jugular venous distention. No bruits, no lymphadenopathy, no thyromegaly.  HEART: Regular rate and rhythm,. No murmurs, no rubs, no clicks.  LUNGS: Clear to auscultation bilaterally. No rales or rhonchi. No wheezes.  ABDOMEN: Soft, flat, nontender, nondistended. Has good bowel sounds. No hepatosplenomegaly appreciated.  EXTREMITIES: No evidence of any cyanosis, clubbing, or peripheral edema.  +2 pedal and radial pulses bilaterally.  NEUROLOGIC: The patient is alert, awake, and oriented x3 with no focal motor or sensory deficits appreciated bilaterally. Having expressive aphasia SKIN: Moist and warm with no rashes appreciated.  Psych: Not anxious, depressed LN: No inguinal LN enlargement    Antibiotics   Anti-infectives    None      Medications   Scheduled Meds: . aspirin  300 mg Rectal Daily   Or  . aspirin  325 mg Oral Daily  . enoxaparin (LOVENOX) injection  40 mg Subcutaneous Q24H   Continuous Infusions:  PRN Meds:.acetaminophen, senna-docusate   Data Review:   Micro Results No results found for this or any previous visit (from the past 240 hour(s)).  Radiology Reports Ct Angio Head W/cm &/or Wo Cm  08/20/2015  CLINICAL DATA:  Unable to remember words or form sentences since falling today. The patient reports suddenly feeling like he was falling to 1 side this morning. He was able to gently gait to the ground without significant trauma. EXAM: CT ANGIOGRAPHY HEAD AND NECK TECHNIQUE: Multidetector CT imaging of the head and neck was performed using the standard protocol during bolus administration of intravenous contrast. Multiplanar CT image reconstructions and MIPs were  obtained to evaluate the vascular anatomy. Carotid stenosis measurements (when applicable) are obtained utilizing NASCET criteria, using the distal internal carotid diameter as the denominator. CONTRAST:  63mL OMNIPAQUE IOHEXOL 350 MG/ML SOLN COMPARISON:  CT head without contrast from the same day. CT head without contrast 04/19/2014 FINDINGS: CT HEAD Brain: The source images demonstrate no acute infarct. The basal ganglia are intact. The insular ribbon is normal. No focal cortical lesions are present. The posterior fossa structures are within normal limits. Calvarium and skull base: Within normal limits Paranasal sinuses: Mild mucosal thickening is present posteriorly in the left maxillary sinus. The paranasal sinuses and mastoid air cells are otherwise clear. Orbits: Negative CTA NECK Aortic arch: A 3 vessel arch configuration is present. Atherosclerotic changes are present without a significant stenosis at the origins of the great vessels. Right carotid system: The right common carotid artery is within normal limits. Minimal atherosclerotic changes are present at the right carotid bifurcation. There is proximal artifact across the internal carotid artery without significant stenosis. Moderate tortuosity is present in the cervical right ICA without significant stenosis below the skullbase. Left carotid system: The left common carotid artery is within normal limits. There is some artifact from hyperdense contrast in the adjacent left internal jugular vein. More prominent left-sided atherosclerotic changes are present at the carotid bifurcation without a significant stenosis. The cervical left ICA is within normal limits focal low the skullbase. Vertebral arteries:The vertebral arteries both originate from the subclavian arteries without significant stenosis. The left vertebral artery is the dominant vessel. There is no significant stenosis of either vertebral artery within the neck. Skeleton: Mild multilevel  endplate degenerative change is present. Uncovertebral disease and facet hypertrophy is worse right than left the multiple levels. There is moderate to severe right-sided foraminal narrowing at C2-3, C3-4, and C4-5. Vertebral body heights and alignment are maintained. No focal lytic or blastic lesions are present. Other neck: The soft tissues the neck are otherwise unremarkable. The vocal cords are midline and symmetric. No focal mucosal or submucosal lesions are present. The tongue base is within normal limits. The salivary glands are within normal  limits bilaterally. The thyroid is unremarkable. No significant adenopathy is present. The lung apices demonstrate mild dependent atelectasis. CTA HEAD Anterior circulation: Atherosclerotic calcifications are present within the cavernous internal carotid arteries bilaterally. The A1 and M1 segments are normal. Are within normal limits. The anterior communicating artery is patent. The MCA bifurcations are intact bilaterally. There is moderate attenuation of distal MCA and ACA branch vessels bilaterally Posterior circulation: The left vertebral artery is the dominant vessel. The PICA origins are visualized and normal. The vertebrobasilar junction is intact. The basilar artery is normal. Both posterior cerebral arteries originate from the basilar tip. The posterior cerebral arteries demonstrate mild segmental irregularity, more prominent distally. Venous sinuses: The dural sinuses are patent. The right transverse sinus is dominant. The straight sinus and deep cerebral veins are intact. The cortical veins are unremarkable. Anatomic variants: None. Delayed phase: The postcontrast images demonstrate no pathologic enhancement. IMPRESSION: 1. Atherosclerotic calcifications at the carotid bifurcations bilaterally, left greater than right. There is no significant associated stenosis. 2. Marked tortuosity of the cervical right ICA without significant stenosis. 3. Atherosclerotic  changes within the cavernous internal carotid arteries bilaterally. 4. Moderate distal small vessel disease bilaterally without a significant proximal stenosis, aneurysm, or branch vessel occlusion. Electronically Signed   By: San Morelle M.D.   On: 08/20/2015 14:23   Ct Head Wo Contrast  08/20/2015  CLINICAL DATA:  Dizziness, confusion, altered mental status beginning this morning, fell prior to arrival, code stroke, initial encounter EXAM: CT HEAD WITHOUT CONTRAST TECHNIQUE: Contiguous axial images were obtained from the base of the skull through the vertex without intravenous contrast. COMPARISON:  04/19/2014 FINDINGS: Mild generalized age-related atrophy. Stable ventricular morphology. No midline shift or mass effect. Otherwise normal appearance of brain parenchyma. No intracranial hemorrhage, mass lesion or evidence acute infarction. No extra-axial fluid collections. Visualized paranasal sinuses and mastoid air cells clear. No acute osseous abnormalities. IMPRESSION: No acute intracranial abnormalities. Findings called to Dr. Corky Downs on 08/20/2015 at 1310 hours. Electronically Signed   By: Lavonia Dana M.D.   On: 08/20/2015 13:11   Ct Angio Neck W/cm &/or Wo/cm  08/20/2015  CLINICAL DATA:  Unable to remember words or form sentences since falling today. The patient reports suddenly feeling like he was falling to 1 side this morning. He was able to gently gait to the ground without significant trauma. EXAM: CT ANGIOGRAPHY HEAD AND NECK TECHNIQUE: Multidetector CT imaging of the head and neck was performed using the standard protocol during bolus administration of intravenous contrast. Multiplanar CT image reconstructions and MIPs were obtained to evaluate the vascular anatomy. Carotid stenosis measurements (when applicable) are obtained utilizing NASCET criteria, using the distal internal carotid diameter as the denominator. CONTRAST:  74mL OMNIPAQUE IOHEXOL 350 MG/ML SOLN COMPARISON:  CT head  without contrast from the same day. CT head without contrast 04/19/2014 FINDINGS: CT HEAD Brain: The source images demonstrate no acute infarct. The basal ganglia are intact. The insular ribbon is normal. No focal cortical lesions are present. The posterior fossa structures are within normal limits. Calvarium and skull base: Within normal limits Paranasal sinuses: Mild mucosal thickening is present posteriorly in the left maxillary sinus. The paranasal sinuses and mastoid air cells are otherwise clear. Orbits: Negative CTA NECK Aortic arch: A 3 vessel arch configuration is present. Atherosclerotic changes are present without a significant stenosis at the origins of the great vessels. Right carotid system: The right common carotid artery is within normal limits. Minimal atherosclerotic changes are present at the right  carotid bifurcation. There is proximal artifact across the internal carotid artery without significant stenosis. Moderate tortuosity is present in the cervical right ICA without significant stenosis below the skullbase. Left carotid system: The left common carotid artery is within normal limits. There is some artifact from hyperdense contrast in the adjacent left internal jugular vein. More prominent left-sided atherosclerotic changes are present at the carotid bifurcation without a significant stenosis. The cervical left ICA is within normal limits focal low the skullbase. Vertebral arteries:The vertebral arteries both originate from the subclavian arteries without significant stenosis. The left vertebral artery is the dominant vessel. There is no significant stenosis of either vertebral artery within the neck. Skeleton: Mild multilevel endplate degenerative change is present. Uncovertebral disease and facet hypertrophy is worse right than left the multiple levels. There is moderate to severe right-sided foraminal narrowing at C2-3, C3-4, and C4-5. Vertebral body heights and alignment are maintained.  No focal lytic or blastic lesions are present. Other neck: The soft tissues the neck are otherwise unremarkable. The vocal cords are midline and symmetric. No focal mucosal or submucosal lesions are present. The tongue base is within normal limits. The salivary glands are within normal limits bilaterally. The thyroid is unremarkable. No significant adenopathy is present. The lung apices demonstrate mild dependent atelectasis. CTA HEAD Anterior circulation: Atherosclerotic calcifications are present within the cavernous internal carotid arteries bilaterally. The A1 and M1 segments are normal. Are within normal limits. The anterior communicating artery is patent. The MCA bifurcations are intact bilaterally. There is moderate attenuation of distal MCA and ACA branch vessels bilaterally Posterior circulation: The left vertebral artery is the dominant vessel. The PICA origins are visualized and normal. The vertebrobasilar junction is intact. The basilar artery is normal. Both posterior cerebral arteries originate from the basilar tip. The posterior cerebral arteries demonstrate mild segmental irregularity, more prominent distally. Venous sinuses: The dural sinuses are patent. The right transverse sinus is dominant. The straight sinus and deep cerebral veins are intact. The cortical veins are unremarkable. Anatomic variants: None. Delayed phase: The postcontrast images demonstrate no pathologic enhancement. IMPRESSION: 1. Atherosclerotic calcifications at the carotid bifurcations bilaterally, left greater than right. There is no significant associated stenosis. 2. Marked tortuosity of the cervical right ICA without significant stenosis. 3. Atherosclerotic changes within the cavernous internal carotid arteries bilaterally. 4. Moderate distal small vessel disease bilaterally without a significant proximal stenosis, aneurysm, or branch vessel occlusion. Electronically Signed   By: San Morelle M.D.   On: 08/20/2015  14:23   Mr Brain Wo Contrast  08/21/2015  CLINICAL DATA:  Acute onset confusion and unsteadiness with word-finding difficulty. EXAM: MRI HEAD WITHOUT CONTRAST TECHNIQUE: Multiplanar, multiecho pulse sequences of the brain and surrounding structures were obtained without intravenous contrast. COMPARISON:  Head CT and CTA 08/20/2015 FINDINGS: There is a moderate-sized acute left MCA territory infarct involving the superior temporal lobe and inferior parietal lobe including the posterior aspect of the operculum. This measures approximately 6 x 3 cm. A punctate focus of acute cortical infarction is noted more posteriorly in the left parieto-occipital region. There is associated cytotoxic edema without significant mass effect. No intracranial hemorrhage, mass, midline shift, or extra-axial fluid collection is seen. Ventricles and sulci are normal in size for age. Scattered, punctate foci of T2 hyperintensity in the cerebral white matter bilaterally are nonspecific but compatible with minimal chronic small vessel ischemic disease, less than is often seen in patients of this age. Orbits are unremarkable. Minimal left maxillary sinus and minimal left ethmoid  air cell mucosal thickening is noted. The mastoid air cells are clear. Major intracranial vascular flow voids are preserved. IMPRESSION: Moderate sized left temporoparietal MCA infarct. Electronically Signed   By: Logan Bores M.D.   On: 08/21/2015 10:58   US Carotid Bilateral  08/21/2015  CLINICAL DATA:  Confusion. EXAM: BILATERAL CAROTID DUPLEX ULTRASOUND TECHNIQUE: Pearline Cables scale imaging, color Doppler and duplex ultrasound were performed of bilateral carotid and vertebral arteries in the neck. COMPARISON:  None. FINDINGS: Criteria: Quantification of carotid stenosis is based on velocity parameters that correlate the residual internal carotid diameter with NASCET-based stenosis levels, using the diameter of the distal internal carotid lumen as the denominator for  stenosis measurement. The following velocity measurements were obtained: RIGHT ICA:  52/16 cm/sec CCA:  [id number] cm/sec SYSTOLIC ICA/CCA RATIO:  0.7 DIASTOLIC ICA/CCA RATIO:  0.9 ECA:  69 cm/sec LEFT ICA:  53/22 cm/sec CCA:  [id number] cm/sec SYSTOLIC ICA/CCA RATIO:  0.8 DIASTOLIC ICA/CCA RATIO:  1.1 ECA:  69 cm/sec RIGHT CAROTID ARTERY: No significant plaque or stenosis is noted in the right cervical carotid vessels. RIGHT VERTEBRAL ARTERY:  Antegrade flow is noted. LEFT CAROTID ARTERY: Moderate irregular calcified plaque is noted in the right carotid bulb and proximal right internal carotid artery consistent with less than 50% diameter stenosis based on ultrasound and Doppler criteria. LEFT VERTEBRAL ARTERY:  Antegrade flow is noted. IMPRESSION: No hemodynamically significant stenosis or plaque is noted in the right cervical carotid arteries. Moderate irregular calcified plaque is noted in the right carotid bulb and proximal right internal carotid artery consistent with less than 50% diameter stenosis based on ultrasound and Doppler criteria. Electronically Signed   By: Marijo Conception, M.D.   On: 08/21/2015 11:57     CBC  Recent Labs Lab 08/20/15 1306 08/21/15 0530  WBC 8.2 5.3  HGB 13.9 13.4  HCT 41.7 39.5*  PLT 145* 118*  MCV 98.0 97.6  MCH 32.7 33.2  MCHC 33.4 34.0  RDW 14.2 14.2  LYMPHSABS 0.5*  --   MONOABS 0.7  --   EOSABS 0.2  --   BASOSABS 0.1  --     Chemistries   Recent Labs Lab 08/20/15 1306  NA 138  K 4.1  CL 105  CO2 28  GLUCOSE 96  BUN 17  CREATININE 0.93  CALCIUM 8.8*  AST 29  ALT 18  ALKPHOS 45  BILITOT 1.0   ------------------------------------------------------------------------------------------------------------------ estimated creatinine clearance is 77.6 mL/min (by C-G formula based on Cr of 0.93). ------------------------------------------------------------------------------------------------------------------ No results for input(s): HGBA1C in the last  72 hours. ------------------------------------------------------------------------------------------------------------------  Recent Labs  08/21/15 0530  CHOL 165  HDL 46  LDLCALC 110*  TRIG 44  CHOLHDL 3.6   ------------------------------------------------------------------------------------------------------------------ No results for input(s): TSH, T4TOTAL, T3FREE, THYROIDAB in the last 72 hours.  Invalid input(s): FREET3 ------------------------------------------------------------------------------------------------------------------ No results for input(s): VITAMINB12, FOLATE, FERRITIN, TIBC, IRON, RETICCTPCT in the last 72 hours.  Coagulation profile  Recent Labs Lab 08/20/15 1306  INR 1.06    No results for input(s): DDIMER in the last 72 hours.  Cardiac Enzymes  Recent Labs Lab 08/20/15 1306  TROPONINI <0.03   ------------------------------------------------------------------------------------------------------------------ Invalid input(s): POCBNP    Assessment & Plan   Patient is a 75 year old white male with acute CVA  1. 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 Severe systolic dysfunction- no evidence of chf  3. Chronic LBBB  Code Status Orders        Start     Ordered   08/20/15 2142  Full code   Continuous     08/20/15 2141    Code Status History    Date Active Date Inactive Code Status Order ID Comments User Context   This patient has a current code status but no historical code status.    Advance Directive Documentation        Most Recent Value   Type of Advance Directive  Living will   Pre-existing out of facility DNR order (yellow form or pink MOST form)     "MOST" Form in Place?             Consults neurology  DVT Prophylaxis  Lovenox   Lab Results  Component Value Date   PLT 118* 08/21/2015      Time Spent in minutes   63min  Dustin Flock M.D on 08/21/2015 at 1:10 PM  Between 7am to 6pm - Pager - [phone]  After 6pm go to [email or web address] - password [email or web address] [facility]   Office  [phone]

## 2015-08-21 NOTE — Care Management Obs Status (Signed)
[facility] NOTIFICATION   Patient Details  Name: Dalton Obrien MRN: [id number] Date of Birth: 08/14/1940   Medicare Observation Status Notification Given:  Yes    Shelbie Ammons, RN 08/21/2015, 11:46 AM

## 2015-08-21 NOTE — Consult Note (Signed)
Referring Physician: Burlene Arnt    Chief Complaint: Dizziness, difficulty with speech  HPI: Dalton Obrien is an 75 y.o. male who reports that he was in [facility] today and had the acute onset of dizziness.  He fell to the floor but did not hit his head.  He was able to check out of the store and drive home but noted when he was home that he could not read or sing his cards correctly.  His wife noted that he was confused as well.  Patient presented for evaluation at that time.  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    Past Medical History  Diagnosis Date  . Asthma   . Cancer ([place])     Basal Cell on Face  Cardiomyopathy HTN in the past- no longer on medications Renal infarction DVT  History reviewed. No pertinent past surgical history.  Family history: Mother deceased from breast cancer.  Had a history of migraines as well.  Brother with h/o DVT.  Son with migraines.  Social History:  reports that he has quit smoking. He does not have any smokeless tobacco history on file. He reports that he drinks about 0.6 oz of alcohol per week. He reports that he does not use illicit drugs.  Allergies:  Allergies  Allergen Reactions  . Penicillins Other (See Comments)    Urinary tract problems  . Pyridium [Phenazopyridine Hcl] Other (See Comments)    Medications: I have reviewed the patient's current medications. Prior to Admission:  Prior to Admission medications   ASA 81mg  daily    ROS: History obtained from the patient  General ROS: negative for - chills, fatigue, fever, night sweats, weight gain or weight loss Psychological ROS: negative for - behavioral disorder, hallucinations, memory difficulties, mood swings or suicidal ideation Ophthalmic ROS: negative for - blurry vision, double vision, eye pain or loss of vision ENT ROS: negative  for - epistaxis, nasal discharge, oral lesions, sore throat, tinnitus or vertigo Allergy and Immunology ROS: negative for - hives or itchy/watery eyes Hematological and Lymphatic ROS: negative for - bleeding problems, bruising or swollen lymph nodes Endocrine ROS: negative for - galactorrhea, hair pattern changes, polydipsia/polyuria or temperature intolerance Respiratory ROS: negative for - cough, hemoptysis, shortness of breath or wheezing Cardiovascular ROS: negative for - chest pain, dyspnea on exertion, edema or irregular heartbeat Gastrointestinal ROS: negative for - abdominal pain, diarrhea, hematemesis, nausea/vomiting or stool incontinence Genito-Urinary ROS: negative for - dysuria, hematuria, incontinence or urinary frequency/urgency Musculoskeletal ROS: negative for - joint swelling or muscular weakness Neurological ROS: as noted in HPI Dermatological ROS: negative for rash and skin lesion changes  Physical Examination: Blood pressure 94/62, pulse 73, temperature 99 F (37.2 C), temperature source Oral, resp. rate 19, height 6' (1.829 m), weight 187 lb 14.4 oz (85.231 kg), SpO2 91 %.  HEENT-  Normocephalic, no lesions, without obvious abnormality.  Normal external eye and conjunctiva.  Normal TM's bilaterally.  Normal auditory canals and external ears. Normal external nose, mucus membranes and septum.  Normal pharynx. Cardiovascular- S1, S2 normal, pulses palpable throughout   Lungs- chest clear, no wheezing, rales, normal symmetric air entry Abdomen- soft, non-tender; bowel sounds normal; no masses,  no organomegaly Extremities- no edema Lymph-no adenopathy palpable Musculoskeletal-no joint tenderness, deformity or swelling Skin-warm and dry, no hyperpigmentation, vitiligo, or suspicious lesions  Neurological  Examination Mental Status: Alert, oriented, thought content appropriate.  Speech with some word finding and paraphasic errors.  Able to follow 3 step commands without  difficulty. Cranial Nerves: II: Discs flat bilaterally; Visual fields grossly normal, pupils equal, round, reactive to light and accommodation III,IV, VI: ptosis not present, extra-ocular motions intact bilaterally V,VII: smile symmetric, facial light touch sensation normal bilaterally VIII: hearing normal bilaterally IX,X: gag reflex present XI: bilateral shoulder shrug XII: midline tongue extension Motor: Right : Upper extremity   5/5    Left:     Upper extremity   5/5  Lower extremity   5/5     Lower extremity   5/5 Tone and bulk:normal tone throughout; no atrophy noted Sensory: Pinprick and light touch intact throughout, bilaterally Deep Tendon Reflexes: 2+ and symmetric with absent AJ's bilaterally Plantars: Right: equivocal   Left: upgoing Cerebellar: Normal finger-to-nose and normal heel-to-shin testing bilaterally Gait: guarded   Laboratory Studies:  Basic Metabolic Panel:  Recent Labs Lab 08/20/15 1306  NA 138  K 4.1  CL 105  CO2 28  GLUCOSE 96  BUN 17  CREATININE 0.93  CALCIUM 8.8*    Liver Function Tests:  Recent Labs Lab 08/20/15 1306  AST 29  ALT 18  ALKPHOS 45  BILITOT 1.0  PROT 7.7  ALBUMIN 4.2   No results for input(s): LIPASE, AMYLASE in the last 168 hours. No results for input(s): AMMONIA in the last 168 hours.  CBC:  Recent Labs Lab 08/20/15 1306 08/21/15 0530  WBC 8.2 5.3  NEUTROABS 6.7*  --   HGB 13.9 13.4  HCT 41.7 39.5*  MCV 98.0 97.6  PLT 145* 118*    Cardiac Enzymes:  Recent Labs Lab 08/20/15 1306  TROPONINI <0.03    BNP: Invalid input(s): POCBNP  CBG:  Recent Labs Lab 08/20/15 [place]    Microbiology: No results found for this or any previous visit.  Coagulation Studies:  Recent Labs  08/20/15 1306  LABPROT 14.0  INR 1.06    Urinalysis: No results for input(s): COLORURINE, LABSPEC, PHURINE, GLUCOSEU, HGBUR, BILIRUBINUR, KETONESUR, PROTEINUR, UROBILINOGEN, NITRITE, LEUKOCYTESUR in the  last 168 hours.  Invalid input(s): APPERANCEUR  Lipid Panel:    Component Value Date/Time   CHOL 165 08/21/2015 0530   TRIG 44 08/21/2015 0530   HDL 46 08/21/2015 0530   CHOLHDL 3.6 08/21/2015 0530   VLDL 9 08/21/2015 0530   LDLCALC 110* 08/21/2015 0530    HgbA1C: No results found for: HGBA1C  Urine Drug Screen:  No results found for: LABOPIA, COCAINSCRNUR, LABBENZ, AMPHETMU, THCU, LABBARB  Alcohol Level: No results for input(s): ETH in the last 168 hours.  Other results: EKG: sinus rhythm at 88 bpm, paired PVC's, prolonged PR interval.    Imaging: Ct Angio Head W/cm &/or Wo Cm  08/20/2015  CLINICAL DATA:  Unable to remember words or form sentences since falling today. The patient reports suddenly feeling like he was falling to 1 side this morning. He was able to gently gait to the ground without significant trauma. EXAM: CT ANGIOGRAPHY HEAD AND NECK TECHNIQUE: Multidetector CT imaging of the head and neck was performed using the standard protocol during bolus administration of intravenous contrast. Multiplanar CT image reconstructions and MIPs were obtained to evaluate the vascular anatomy. Carotid stenosis measurements (when applicable) are obtained utilizing NASCET criteria, using the distal internal carotid diameter as the denominator. CONTRAST:  26mL OMNIPAQUE IOHEXOL 350 MG/ML SOLN COMPARISON:  CT head without contrast from the same day.  CT head without contrast 04/19/2014 FINDINGS: CT HEAD Brain: The source images demonstrate no acute infarct. The basal ganglia are intact. The insular ribbon is normal. No focal cortical lesions are present. The posterior fossa structures are within normal limits. Calvarium and skull base: Within normal limits Paranasal sinuses: Mild mucosal thickening is present posteriorly in the left maxillary sinus. The paranasal sinuses and mastoid air cells are otherwise clear. Orbits: Negative CTA NECK Aortic arch: A 3 vessel arch configuration is present.  Atherosclerotic changes are present without a significant stenosis at the origins of the great vessels. Right carotid system: The right common carotid artery is within normal limits. Minimal atherosclerotic changes are present at the right carotid bifurcation. There is proximal artifact across the internal carotid artery without significant stenosis. Moderate tortuosity is present in the cervical right ICA without significant stenosis below the skullbase. Left carotid system: The left common carotid artery is within normal limits. There is some artifact from hyperdense contrast in the adjacent left internal jugular vein. More prominent left-sided atherosclerotic changes are present at the carotid bifurcation without a significant stenosis. The cervical left ICA is within normal limits focal low the skullbase. Vertebral arteries:The vertebral arteries both originate from the subclavian arteries without significant stenosis. The left vertebral artery is the dominant vessel. There is no significant stenosis of either vertebral artery within the neck. Skeleton: Mild multilevel endplate degenerative change is present. Uncovertebral disease and facet hypertrophy is worse right than left the multiple levels. There is moderate to severe right-sided foraminal narrowing at C2-3, C3-4, and C4-5. Vertebral body heights and alignment are maintained. No focal lytic or blastic lesions are present. Other neck: The soft tissues the neck are otherwise unremarkable. The vocal cords are midline and symmetric. No focal mucosal or submucosal lesions are present. The tongue base is within normal limits. The salivary glands are within normal limits bilaterally. The thyroid is unremarkable. No significant adenopathy is present. The lung apices demonstrate mild dependent atelectasis. CTA HEAD Anterior circulation: Atherosclerotic calcifications are present within the cavernous internal carotid arteries bilaterally. The A1 and M1 segments are  normal. Are within normal limits. The anterior communicating artery is patent. The MCA bifurcations are intact bilaterally. There is moderate attenuation of distal MCA and ACA branch vessels bilaterally Posterior circulation: The left vertebral artery is the dominant vessel. The PICA origins are visualized and normal. The vertebrobasilar junction is intact. The basilar artery is normal. Both posterior cerebral arteries originate from the basilar tip. The posterior cerebral arteries demonstrate mild segmental irregularity, more prominent distally. Venous sinuses: The dural sinuses are patent. The right transverse sinus is dominant. The straight sinus and deep cerebral veins are intact. The cortical veins are unremarkable. Anatomic variants: None. Delayed phase: The postcontrast images demonstrate no pathologic enhancement. IMPRESSION: 1. Atherosclerotic calcifications at the carotid bifurcations bilaterally, left greater than right. There is no significant associated stenosis. 2. Marked tortuosity of the cervical right ICA without significant stenosis. 3. Atherosclerotic changes within the cavernous internal carotid arteries bilaterally. 4. Moderate distal small vessel disease bilaterally without a significant proximal stenosis, aneurysm, or branch vessel occlusion. Electronically Signed   By: San Morelle M.D.   On: 08/20/2015 14:23   Ct Head Wo Contrast  08/20/2015  CLINICAL DATA:  Dizziness, confusion, altered mental status beginning this morning, fell prior to arrival, code stroke, initial encounter EXAM: CT HEAD WITHOUT CONTRAST TECHNIQUE: Contiguous axial images were obtained from the base of the skull through the vertex without intravenous contrast. COMPARISON:  04/19/2014 FINDINGS: Mild generalized age-related atrophy. Stable ventricular morphology. No midline shift or mass effect. Otherwise normal appearance of brain parenchyma. No intracranial hemorrhage, mass lesion or evidence acute infarction.  No extra-axial fluid collections. Visualized paranasal sinuses and mastoid air cells clear. No acute osseous abnormalities. IMPRESSION: No acute intracranial abnormalities. Findings called to Dr. Corky Downs on 08/20/2015 at 1310 hours. Electronically Signed   By: Lavonia Dana M.D.   On: 08/20/2015 13:11   Ct Angio Neck W/cm &/or Wo/cm  08/20/2015  CLINICAL DATA:  Unable to remember words or form sentences since falling today. The patient reports suddenly feeling like he was falling to 1 side this morning. He was able to gently gait to the ground without significant trauma. EXAM: CT ANGIOGRAPHY HEAD AND NECK TECHNIQUE: Multidetector CT imaging of the head and neck was performed using the standard protocol during bolus administration of intravenous contrast. Multiplanar CT image reconstructions and MIPs were obtained to evaluate the vascular anatomy. Carotid stenosis measurements (when applicable) are obtained utilizing NASCET criteria, using the distal internal carotid diameter as the denominator. CONTRAST:  65mL OMNIPAQUE IOHEXOL 350 MG/ML SOLN COMPARISON:  CT head without contrast from the same day. CT head without contrast 04/19/2014 FINDINGS: CT HEAD Brain: The source images demonstrate no acute infarct. The basal ganglia are intact. The insular ribbon is normal. No focal cortical lesions are present. The posterior fossa structures are within normal limits. Calvarium and skull base: Within normal limits Paranasal sinuses: Mild mucosal thickening is present posteriorly in the left maxillary sinus. The paranasal sinuses and mastoid air cells are otherwise clear. Orbits: Negative CTA NECK Aortic arch: A 3 vessel arch configuration is present. Atherosclerotic changes are present without a significant stenosis at the origins of the great vessels. Right carotid system: The right common carotid artery is within normal limits. Minimal atherosclerotic changes are present at the right carotid bifurcation. There is proximal  artifact across the internal carotid artery without significant stenosis. Moderate tortuosity is present in the cervical right ICA without significant stenosis below the skullbase. Left carotid system: The left common carotid artery is within normal limits. There is some artifact from hyperdense contrast in the adjacent left internal jugular vein. More prominent left-sided atherosclerotic changes are present at the carotid bifurcation without a significant stenosis. The cervical left ICA is within normal limits focal low the skullbase. Vertebral arteries:The vertebral arteries both originate from the subclavian arteries without significant stenosis. The left vertebral artery is the dominant vessel. There is no significant stenosis of either vertebral artery within the neck. Skeleton: Mild multilevel endplate degenerative change is present. Uncovertebral disease and facet hypertrophy is worse right than left the multiple levels. There is moderate to severe right-sided foraminal narrowing at C2-3, C3-4, and C4-5. Vertebral body heights and alignment are maintained. No focal lytic or blastic lesions are present. Other neck: The soft tissues the neck are otherwise unremarkable. The vocal cords are midline and symmetric. No focal mucosal or submucosal lesions are present. The tongue base is within normal limits. The salivary glands are within normal limits bilaterally. The thyroid is unremarkable. No significant adenopathy is present. The lung apices demonstrate mild dependent atelectasis. CTA HEAD Anterior circulation: Atherosclerotic calcifications are present within the cavernous internal carotid arteries bilaterally. The A1 and M1 segments are normal. Are within normal limits. The anterior communicating artery is patent. The MCA bifurcations are intact bilaterally. There is moderate attenuation of distal MCA and ACA branch vessels bilaterally Posterior circulation: The left vertebral artery is the  dominant vessel.  The PICA origins are visualized and normal. The vertebrobasilar junction is intact. The basilar artery is normal. Both posterior cerebral arteries originate from the basilar tip. The posterior cerebral arteries demonstrate mild segmental irregularity, more prominent distally. Venous sinuses: The dural sinuses are patent. The right transverse sinus is dominant. The straight sinus and deep cerebral veins are intact. The cortical veins are unremarkable. Anatomic variants: None. Delayed phase: The postcontrast images demonstrate no pathologic enhancement. IMPRESSION: 1. Atherosclerotic calcifications at the carotid bifurcations bilaterally, left greater than right. There is no significant associated stenosis. 2. Marked tortuosity of the cervical right ICA without significant stenosis. 3. Atherosclerotic changes within the cavernous internal carotid arteries bilaterally. 4. Moderate distal small vessel disease bilaterally without a significant proximal stenosis, aneurysm, or branch vessel occlusion. Electronically Signed   By: San Morelle M.D.   On: 08/20/2015 14:23   Mr Brain Wo Contrast  08/21/2015  CLINICAL DATA:  Acute onset confusion and unsteadiness with word-finding difficulty. EXAM: MRI HEAD WITHOUT CONTRAST TECHNIQUE: Multiplanar, multiecho pulse sequences of the brain and surrounding structures were obtained without intravenous contrast. COMPARISON:  Head CT and CTA 08/20/2015 FINDINGS: There is a moderate-sized acute left MCA territory infarct involving the superior temporal lobe and inferior parietal lobe including the posterior aspect of the operculum. This measures approximately 6 x 3 cm. A punctate focus of acute cortical infarction is noted more posteriorly in the left parieto-occipital region. There is associated cytotoxic edema without significant mass effect. No intracranial hemorrhage, mass, midline shift, or extra-axial fluid collection is seen. Ventricles and sulci are normal in size for  age. Scattered, punctate foci of T2 hyperintensity in the cerebral white matter bilaterally are nonspecific but compatible with minimal chronic small vessel ischemic disease, less than is often seen in patients of this age. Orbits are unremarkable. Minimal left maxillary sinus and minimal left ethmoid air cell mucosal thickening is noted. The mastoid air cells are clear. Major intracranial vascular flow voids are preserved. IMPRESSION: Moderate sized left temporoparietal MCA infarct. Electronically Signed   By: Logan Bores M.D.   On: 08/21/2015 10:58    Assessment: 75 y.o. male presenting with aphasia and dizziness.  Dizziness resolved but patient continues to have some mild speech issues.  Symptoms resolving and therefore patient not a tPA candidate.  Head CT personally reviewed and shows no acute changes.  Patient on ASA at home.   MRI L tempo/parietal infarct.    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 If the echo does show poor EF as stated by wife would start pt on anticoagulation in about 10-14 days due to large size of stroke. There is a good chance this is cardio embolic due to stasis in setting of poor EF.  - otherwise for now please continue ASA 325 daily.   - speech therapy - Pt/OT  Leotis Pain

## 2015-08-21 NOTE — Progress Notes (Signed)
PT Hold Note  Patient Details Name: PRANJAL MAYHEW MRN: [id number] DOB: 04/29/1941   Cancelled Treatment:    Reason Eval/Treat Not Completed: Patient at procedure or test/unavailable. Chart reviewed and RN consulted. Attempted to see pt at 11:00AM however he is out of room for testing. Will attempt at later time as pt is available.   Lyndel Safe Huprich PT, DPT   Huprich,Jason 08/21/2015, 12:25 PM

## 2015-08-21 NOTE — Progress Notes (Signed)
OT Cancellation Note  Patient Details Name: Dalton Obrien MRN: [id number] DOB: 03/02/1941   Cancelled Treatment:    Reason Eval/Treat Not Completed: Other (comment). Patient with speech. Will re-attempt time permitting.  Sharon Mt 08/21/2015, 2:10 PM

## 2015-08-21 NOTE — Progress Notes (Signed)
*  PRELIMINARY RESULTS* Echocardiogram 2D Echocardiogram has been performed.  Dalton Obrien 08/21/2015, 11:51 AM

## 2015-08-21 NOTE — Care Management (Signed)
Admitted to [facility] with the diagnosis of TIA. Lives with wife, Izora Gala [phone]). Last seen Dr. Lovie Macadamia a week ago. No home Health. No skilled facility. No Home oxygen. Uses no aids for ambulation. Takes care of both basic and instrumental activities of daily living himself.  Still drives. Fell yesterday. Good appetite. Family will transport. Shelbie Ammons RN MSN CCM Care Management [phone]

## 2015-08-21 NOTE — Evaluation (Signed)
Physical Therapy Evaluation Patient Details Name: Dalton Obrien MRN: [id number] DOB: [birth year] Today's Date: 08/21/2015   History of Present Illness  Dalton Obrien  is a 75 y.o. male with no significant medical history who presents from home with confusion. Patient was in his usual state of health until 10:30AM when he went to [employer] to buy Valentine's Day card for his wife, while there he became unsteady and confused; he was able to drive home but confusion persisted, this was constant, moderate severity, associated with word finding difficulty; no alleviating factors. In ED he is noted to have some word finding difficulty as well as some inability to understand/interprete questions that are asked. He denies headaches, vision changes, paresthesias, focal extremity weakness. He reports that that he was previously on a beta blocker but this was stopped due to baseline low/normal blood pressures; the medication was lowering his blood pressure further so it was discontinued. At time of evaluation pt presents with moderate receptive and expressive aphasia. Appears fully oriented but unable to answer AO questions. Difficult to obtain history  Clinical Impression  Pt with significant receptive and expressive aphasia which limits his ability to participate with components of PT exam. History obtained by wife over the phone. Pt demonstrates normal bed mobility, transfers, and ambulation on this date without assistive device. Strength appears full and functional however unable to perform MMT due to receptive aphasia. Pt appears completely oriented but again unable to answer orientation questions. He does know that he is in [place] and is able to tell me about some of the events surrounding his admission. Pt has no further PT needs at discharge. 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 Will keep pt on PT caseload temporarily in order to further assess high level balance and ensure he does not develop  expanding zone of infarct with concurrent motor deficits while admitted to [facility].     Follow Up Recommendations No PT follow up    Equipment Recommendations  None recommended by PT    Recommendations for Other Services       Precautions / Restrictions Precautions Precautions: Fall Restrictions Weight Bearing Restrictions: No      Mobility  Bed Mobility Overal bed mobility: Independent             General bed mobility comments: good speed, sequencing, and strength noted  Transfers Overall transfer level: Independent Equipment used: None             General transfer comment: No evidence for instability. Good speed and strength noted  Ambulation/Gait Ambulation/Gait assistance: Supervision Ambulation Distance (Feet): 250 Feet Assistive device: None Gait Pattern/deviations: WFL(Within Functional Limits) Gait velocity: WFL Gait velocity interpretation: >2.62 ft/sec, indicative of independent community ambulator General Gait Details: Pt with good speed and stability. good scanning of environment and able to perform head turns without LOB. No evidence for instability.   Stairs            Wheelchair Mobility    Modified Rankin (Stroke Patients Only)       Balance Overall balance assessment: No apparent balance deficits (not formally assessed) (difficult to fully assess due to receptive aphasia)                                           Pertinent Vitals/Pain Pain Assessment: No/denies pain    Home Living Family/patient expects to be discharged  to:: Private residence Living Arrangements: Spouse/significant other Available Help at Discharge: Family Type of Home: House Home Access: Stairs to enter Entrance Stairs-Rails: None Entrance Stairs-Number of Steps: 2 Home Layout: Multi-level;Able to live on main level with bedroom/bathroom Home Equipment: Shower seat - built in (no assistive device)      Prior Function Level of  Independence: Independent         Comments: Driving and independent with ADL/IADLs. Walked for exercise and played golf     Hand Dominance   Dominant Hand: Right    Extremity/Trunk Assessment   Upper Extremity Assessment: Overall WFL for tasks assessed (Poor command follow due to receptive aphasia)           Lower Extremity Assessment: Overall WFL for tasks assessed         Communication   Communication: Receptive difficulties;Expressive difficulties (Moderate receptive and expressive aphasia)  Cognition Arousal/Alertness: Awake/alert Behavior During Therapy: WFL for tasks assessed/performed Overall Cognitive Status: Difficult to assess                      General Comments      Exercises        Assessment/Plan    PT Assessment Patient needs continued PT services  PT Diagnosis Other (comment);Altered mental status;Difficulty walking (Acute L MCA CVA, Hx of fall)   PT Problem List Decreased balance;Other (comment);Decreased knowledge of use of DME (Hx of fall, Receptive/expressive aphasia)  PT Treatment Interventions DME instruction;Stair training;Functional mobility training;Therapeutic activities;Therapeutic exercise;Balance training;Neuromuscular re-education;Cognitive remediation;Patient/family education   PT Goals (Current goals can be found in the Care Plan section) Acute Rehab PT Goals Patient Stated Goal: Pt unable to provide answer due to receptive aphasia    Frequency 7X/week   Barriers to discharge        Co-evaluation               End of Session Equipment Utilized During Treatment: Gait belt Activity Tolerance: Patient tolerated treatment well Patient left: in bed;with bed alarm set;with call bell/phone within reach Nurse Communication: Mobility status         Time: [id number] PT Time Calculation (min) (ACUTE ONLY): 20 min   Charges:   PT Evaluation $PT Eval Low Complexity: 1 Procedure     PT G Codes:       Lyndel Safe Huprich PT, DPT   Huprich,Jason 08/21/2015, 3:37 PM

## 2015-08-21 NOTE — Evaluation (Signed)
Speech Language Pathology Evaluation Patient Details Name: Dalton Obrien MRN: [id number] DOB: June 17, 1941 Today's Date: 08/21/2015 Time: [id number] SLP Time Calculation (min) (ACUTE ONLY): 60 min  Problem List:  Patient Active Problem List   Diagnosis Date Noted  . CVA (cerebral infarction) 08/21/2015  . TIA (transient ischemic attack) 08/20/2015  . Acute encephalopathy 08/20/2015   Past Medical History:  Past Medical History  Diagnosis Date  . Asthma   . 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 No pertinent past surgical history. HPI:  Pt is a 75 y.o. male with no significant medical history who presents from home with confusion. Patient was in his usual state of health until 10:30AM when he went to [employer] to buy [employer] Day card for his wife, while there he became unsteady and confused; he was able to drive home but confusion persisted, this was constant, moderate severity, associated with word finding difficulty; no alleviating factors. In ED he is noted to have some word finding difficulty as well as some inability to understand/interprete questions that are asked. He denies headaches, vision changes, paresthesias, focal extremity weakness. He reports that that he was previously on a beta blocker but this was stopped due to baseline low/normal blood pressures; the medication was lowering his blood pressure further so it was discontinued. At time of evaluation pt presents with moderate receptive and expressive aphasia. Appears fully oriented but unable to answer AO questions. Difficult to obtain historyd/t communication deficits. Pt is somewhat aware of his communication deficits and frustrated by them. He has some insight asking when this could "get better". No observed or reported deficits w/ swallowing per NSG or wife/pt.  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 Pt's verbal expression was c/b fair fluency of speech w/ intermittent halting, paraphasias, and word-finding deficits. Pt was minimally aware of the paraphasias and his own mistakes in communication at times, then at other times, he appeared somehwhat ftrustrated attempting to fully communicate all he wanted to say. Comprehension of instruction, questions(Y/N), tasks, and steps of tasks was poor. Instead, he began to verbalize in response to the task or instruction. Although, he responded more appropriately and accurately when presented objects and communication was primarily by gestures vs verbal; noted follow-through w/ a task if he were handed objects and asked "what is this?". Pt could also model the function as it was shown to him(nonverbal communication). Pt appeared oriented to his setting and wife even was able to articulate the events surrounding the morning of the CVA. Pt would benefit from continued skilled ST services for speech-language therapy in order to enhance communication in ADLs; education w/ caregivers for support of communication strategies(ie. gestures) w/ pt's communication needs.     SLP Assessment  Patient needs continued Speech Lanaguage Pathology Services    Follow Up Recommendations  Outpatient SLP;Home health SLP (TBD)    Frequency and Duration min 3x week  2 weeks      SLP Evaluation Prior Functioning  Cognitive/Linguistic Baseline: Within functional limits Type of Home: House  Lives With: Spouse Available Help at Discharge: Family Education: college Vocation: Retired   Associate Professor  Overall Cognitive Status: Impaired/Different from baseline Arousal/Alertness: Awake/alert Orientation Level: Oriented X4 (w/ strategies) Attention: Focused;Sustained Focused Attention: Appears intact Sustained Attention: Appears intact Memory: Appears intact Awareness: Appears intact Problem Solving: Appears intact Executive Function:   (difficult  to fully assess sec. to the language deficits; wfl) Behaviors: Poor frustration tolerance;Perseveration    Comprehension  Auditory Comprehension Overall Auditory Comprehension: Impaired Yes/No Questions: Impaired (impaired) Commands: Impaired (impaired) Conversation:  (impaired) Other Conversation Comments: Wernicke's Aphasia Reading Comprehension Reading Status: Not tested    Expression Expression Primary Mode of Expression: Verbal Verbal Expression Overall Verbal Expression: Impaired Initiation: No impairment Automatic Speech: Counting;Day of week;Social Response Level of Generative/Spontaneous Verbalization: Sentence;Conversation Repetition: Impaired Naming: Impairment Confrontation: Impaired Other Naming Comments: Wernicke's Aphasia Verbal Errors: Perseveration;Phonemic paraphasias;Jargon;Not aware of errors Pragmatics: No impairment Effective Techniques:  (object cues) Non-Verbal Means of Communication: Gestures Other Verbal Expression Comments: Wernicke's Aphasia Written Expression Dominant Hand: Right Written Expression:  (impaired)   Oral / Motor  Oral Motor/Sensory Function Overall Oral Motor/Sensory Function: Within functional limits Motor Speech Overall Motor Speech: Appears within functional limits for tasks assessed Respiration: Within functional limits Phonation: Normal Resonance: Within functional limits Articulation: Within functional limitis Intelligibility: Intelligible Motor Planning: Witnin functional limits Motor Speech Errors: Not applicable   GO                    Angelita Harnack 08/21/2015, 4:14 PM

## 2015-08-21 NOTE — Evaluation (Signed)
Occupational Therapy Evaluation Patient Details Name: Dalton Obrien MRN: [id number] DOB: [birth year] Today's Date: 08/21/2015    History of Present Illness This patient is a 75 year old male who came to [facility] with confusion.  Dalton Obrien is a 75 y.o. male with no significant medical history who presents from home with confusion. Patient was in his usual state of health until 10:30AM when he went to [employer] to buy [employer] Day card for his wife, while there he became unsteady and confused; he was able to drive home but confusion persisted, this was constant, moderate severity, associated with word finding difficulty; no alleviating factors. In ED he is noted to have some word finding difficulty as well as some inability to understand/interpret questions that are asked. He denies headaches, vision changes, paresthesias, focal extremity weakness.   Clinical Impression   This patient is a 75 year old male who came to [facility] with the above history.  He lives with his wife in a 2 story home but able to stay on 1st floor.  He had been independent with all ADL and functional mobility. He now shows deficits in communication with expressive and receptive aphasia. This did interfere with some tests during the evaluation such as sensory. She showed that he can dress himself including tying shoes. He combed his hair. Would like a 2 session trial incase other deficits show up, otherwise do not see any Occupational Therapy needs at this time. He is getting speech.    Follow Up Recommendations  No OT follow up    Equipment Recommendations       Recommendations for Other Services       Precautions / Restrictions Precautions Precautions: Fall Restrictions Weight Bearing Restrictions: No      Mobility Bed Mobility Overal bed mobility: Independent               Transfers Overall transfer level: Independent Equipment used: None                 Balance                                           ADL                                         General ADL Comments: Had been independent with ADL.  He donned doffed shoe and tied it with normal speed and accuracy. He combed his hair when given a comb.       Vision     Perception     Praxis      Pertinent Vitals/Pain Pain Assessment: No/denies pain     Hand Dominance Right   Extremity/Trunk Assessment Upper Extremity Assessment Upper Extremity Assessment:  (B UE strength 5/5 through out, grip R 67 lbs, L 60 lbs.  9 hole peg test R 23 sec. L 27 sec. Patient did not understand sensory tests.)   Lower Extremity Assessment Lower Extremity Assessment: Defer to PT evaluation       Communication Communication Communication: Receptive difficulties;Expressive difficulties   Cognition Arousal/Alertness: Awake/alert Behavior During Therapy: WFL for tasks assessed/performed Overall Cognitive Status: Impaired/Different from baseline  General Comments       Exercises       Shoulder Instructions      Home Living Family/patient expects to be discharged to:: Private residence Living Arrangements: Spouse/significant other Available Help at Discharge: Family Type of Home: House Home Access: Stairs to enter [employer] of Steps: 2 Entrance Stairs-Rails: None Home Layout: Multi-level;Able to live on main level with bedroom/bathroom     Bathroom Shower/Tub: Astronomer Accessibility: Yes   Home Equipment: Shower seat - built in      Lives With: Spouse    Prior Functioning/Environment Level of Independence: Independent        Comments: Driving, walking, golfing.    OT Diagnosis: Other (comment) (expressive and receptive aphasia)   OT Problem List:  (aphasia)   OT Treatment/Interventions:      OT Goals(Current goals can be found in the care plan section) Acute Rehab OT Goals Patient  Stated Goal: unable secondary to aphasia  OT Frequency:     Barriers to D/C:            Co-evaluation              End of Session Equipment Utilized During Treatment:  (stroke test kit.)  Activity Tolerance:   Patient left: in bed;with call bell/phone within reach;with bed alarm set   Time: 5974-7185 OT Time Calculation (min): 18 min Charges:  OT General Charges $OT Visit: 1 Procedure OT Evaluation $OT Eval Low Complexity: 1 Procedure G-Codes:    Myrene Galas, MS/OTR/L  08/21/2015, 4:32 PM

## 2015-08-22 MED ORDER — ATORVASTATIN CALCIUM 20 MG PO TABS: 20.0000 mg | ORAL_TABLET | Freq: Every day | ORAL | Status: DC

## 2015-08-22 MED ORDER — ASPIRIN 325 MG PO TABS: 325.0000 mg | ORAL_TABLET | Freq: Every day | ORAL | Status: DC

## 2015-08-22 NOTE — Discharge Instructions (Signed)

## 2015-08-22 NOTE — Progress Notes (Signed)
Physical Therapy Treatment Patient Details Name: Dalton Obrien MRN: [id number] DOB: Sep 15, 1940 Today's Date: 08/22/2015    History of Present Illness This patient is a 75 year old male who came to [facility] with confusion.  Dalton Obrien is a 75 y.o. male with no significant medical history who presents from home with confusion. Patient was in his usual state of health until 10:30AM when he went to [employer] to buy [employer] Day card for his wife, while there he became unsteady and confused; he was able to drive home but confusion persisted, this was constant, moderate severity, associated with word finding difficulty; no alleviating factors. In ED he is noted to have some word finding difficulty as well as some inability to understand/interprete questions that are asked. He denies headaches, vision changes, paresthesias, focal extremity weakness.    PT Comments    Treatment session to assess patient. On this date pt able to participate with MMT and sensory testing. Strength is at least 4+/5 throughout and pt denies loss of sensation to light touch. Able to ambulate full lap around RN station independently. Pt able to perform head turns and scan visual environment without loss of balance. Negative Rhomberg and single leg balance is 6-8 seconds on each LEs. Did not perform BERG as pt able to complete highest level balance activities with minimal impairments. Overall aphasia appears to be mildly improved from yesterday. Pt follows commands better and performs object naming with increased accuracy. No further PT need identified. Will sign off. Pt should continue to work with speech therapy regarding receptive and expressive aphasia.    Follow Up Recommendations  No PT follow up     Equipment Recommendations  None recommended by PT    Recommendations for Other Services       Precautions / Restrictions Precautions Precautions: Fall Restrictions Weight Bearing Restrictions: No    Mobility  Bed  Mobility               General bed mobility comments: Received upright in recliner  Transfers Overall transfer level: Independent Equipment used: None             General transfer comment: No evidence for instability. Good speed and strength noted  Ambulation/Gait Ambulation/Gait assistance: Independent Ambulation Distance (Feet): 250 Feet Assistive device: None Gait Pattern/deviations: WFL(Within Functional Limits) Gait velocity: WFL Gait velocity interpretation: >2.62 ft/sec, indicative of independent community ambulator General Gait Details: Pt able to perform head turns without gait deviations. Good step length and overall speed noted. No evidence for instability. No DOE observed.   Stairs            Wheelchair Mobility    Modified Rankin (Stroke Patients Only)       Balance Overall balance assessment: No apparent balance deficits (not formally assessed) Sitting-balance support: No upper extremity supported Sitting balance-Leahy Scale: Normal     Standing balance support: No upper extremity supported Standing balance-Leahy Scale: Normal Standing balance comment: Negative Rhomberg. Single leg balance approximatley 6-8 seconds on each leg                    Cognition Arousal/Alertness: Awake/alert Behavior During Therapy: WFL for tasks assessed/performed Overall Cognitive Status: Within Functional Limits for tasks assessed (eceptive/expressive aphasia)                      Exercises      General Comments        Pertinent Vitals/Pain Pain Assessment: No/denies pain  Home Living Family/patient expects to be discharged to:: Private residence Living Arrangements: Spouse/significant other Available Help at Discharge: Family Type of Home: House Home Access: Stairs to enter Entrance Stairs-Rails: None Home Layout: Multi-level;Able to live on main level with bedroom/bathroom Home Equipment: Shower seat - built in      Prior  Function Level of Independence: Independent      Comments: Driving, walking, golfing.   PT Goals (current goals can now be found in the care plan section) Acute Rehab PT Goals Patient Stated Goal: unable secondary to aphasia Progress towards PT goals: Progressing toward goals    Frequency       PT Plan Frequency needs to be updated    Co-evaluation             End of Session Equipment Utilized During Treatment: Gait belt Activity Tolerance: Patient tolerated treatment well Patient left: with call bell/phone within reach;in chair;with family/visitor present     Time: [id number] PT Time Calculation (min) (ACUTE ONLY): 15 min  Charges:  $Therapeutic Activity: 8-22 mins                    G Codes:      Dalton Obrien PT, DPT   Dalton Obrien 08/22/2015, 11:20 AM

## 2015-08-22 NOTE — Discharge Summary (Signed)
Dalton Obrien, 75 y.o., DOB [birth year], MRN [id number]. 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 male with no significant medical history who presents from home with confusion. Patient was in his usual state of health until 10:30AM when he went to [facility] to buy Valentine's Day card for his wife, while there he became unsteady and confused; he was able to drive home but confusion persisted, this was constant, moderate severity, associated with word finding difficulty; no alleviating factors. In ED he is noted to have some word finding difficulty as well as some inability to understand/interprete questions that are asked. He was admitted to the hospital initially as a TIA. His CT scan of the head was negative. He subsequently underwent MRI which confirmed a moderate size left temporoparietal MCA infarct. Patient also underwent a CT angiogram of the head and neck. Which only showed arthrosclerotic calcifications and tortuosity of the cervical right ICA without significant stenosis. Arthrosclerotic changes within the cavernous internal carotid artery were noted moderate distal small vessel disease bilaterally was noted as well. Ever no significant stenosis was noted.  He was seen by Dr. Macie Burows of neurology who recommended echocardiogram of the heart. Patient does have a history of significant systolic dysfunction. Which was confirmed on the echo. Based on those findings he feels the patient likely had cardioembolic CVA. And recommends him being started on anticoagulation. He wanted to  wait for 10-14 days prior to any anticoagulation being started. Patient will be followed up by his primary neurologist and his cardiologist. At that point he will need to be started on anticoagulation after appropriate time has passed. He still continues to have expressive aphasia but is improved compared to admission.        Consults  None and neurology  Significant Tests:  See full reports for all details      Ct Angio Head W/cm &/or Wo Cm  08/20/2015  CLINICAL DATA:  Unable to remember words or form sentences since falling today. The patient reports suddenly feeling like he was falling to 1 side this morning. He was able to gently gait to the ground without significant trauma. EXAM: CT ANGIOGRAPHY HEAD AND NECK TECHNIQUE: Multidetector CT imaging of the head and neck was performed using the standard protocol during bolus administration of intravenous contrast. Multiplanar CT image reconstructions and MIPs were obtained to evaluate the vascular anatomy. Carotid stenosis measurements (when applicable) are obtained utilizing NASCET criteria, using the distal internal carotid diameter as the denominator. CONTRAST:  35mL OMNIPAQUE IOHEXOL 350 MG/ML SOLN COMPARISON:  CT head without contrast from the same day. CT head without contrast 04/19/2014 FINDINGS: CT HEAD Brain: The source images demonstrate no acute infarct. The basal ganglia are intact. The insular ribbon is normal. No focal cortical lesions are present. The posterior fossa structures are within normal limits. Calvarium and skull base: Within normal limits Paranasal sinuses: Mild mucosal thickening is present posteriorly in the left maxillary sinus. The paranasal sinuses and mastoid air cells are otherwise clear. Orbits: Negative CTA NECK Aortic arch: A 3 vessel arch configuration is present. Atherosclerotic changes are present without a significant stenosis at the origins of the great vessels.  Right carotid system: The right common carotid  artery is within normal limits. Minimal atherosclerotic changes are present at the right carotid bifurcation. There is proximal artifact across the internal carotid artery without significant stenosis. Moderate tortuosity is present in the cervical right ICA without significant stenosis below the skullbase. Left carotid system: The left common carotid artery is within normal limits. There is some artifact from hyperdense contrast in the adjacent left internal jugular vein. More prominent left-sided atherosclerotic changes are present at the carotid bifurcation without a significant stenosis. The cervical left ICA is within normal limits focal low the skullbase. Vertebral arteries:The vertebral arteries both originate from the subclavian arteries without significant stenosis. The left vertebral artery is the dominant vessel. There is no significant stenosis of either vertebral artery within the neck. Skeleton: Mild multilevel endplate degenerative change is present. Uncovertebral disease and facet hypertrophy is worse right than left the multiple levels. There is moderate to severe right-sided foraminal narrowing at C2-3, C3-4, and C4-5. Vertebral body heights and alignment are maintained. No focal lytic or blastic lesions are present. Other neck: The soft tissues the neck are otherwise unremarkable. The vocal cords are midline and symmetric. No focal mucosal or submucosal lesions are present. The tongue base is within normal limits. The salivary glands are within normal limits bilaterally. The thyroid is unremarkable. No significant adenopathy is present. The lung apices demonstrate mild dependent atelectasis. CTA HEAD Anterior circulation: Atherosclerotic calcifications are present within the cavernous internal carotid arteries bilaterally. The A1 and M1 segments are normal. Are within normal limits. The anterior communicating artery is patent. The MCA bifurcations are intact bilaterally. There is moderate  attenuation of distal MCA and ACA branch vessels bilaterally Posterior circulation: The left vertebral artery is the dominant vessel. The PICA origins are visualized and normal. The vertebrobasilar junction is intact. The basilar artery is normal. Both posterior cerebral arteries originate from the basilar tip. The posterior cerebral arteries demonstrate mild segmental irregularity, more prominent distally. Venous sinuses: The dural sinuses are patent. The right transverse sinus is dominant. The straight sinus and deep cerebral veins are intact. The cortical veins are unremarkable. Anatomic variants: None. Delayed phase: The postcontrast images demonstrate no pathologic enhancement. IMPRESSION: 1. Atherosclerotic calcifications at the carotid bifurcations bilaterally, left greater than right. There is no significant associated stenosis. 2. Marked tortuosity of the cervical right ICA without significant stenosis. 3. Atherosclerotic changes within the cavernous internal carotid arteries bilaterally. 4. Moderate distal small vessel disease bilaterally without a significant proximal stenosis, aneurysm, or branch vessel occlusion. Electronically Signed   By: San Morelle M.D.   On: 08/20/2015 14:23   Ct Head Wo Contrast  08/20/2015  CLINICAL DATA:  Dizziness, confusion, altered mental status beginning this morning, fell prior to arrival, code stroke, initial encounter EXAM: CT HEAD WITHOUT CONTRAST TECHNIQUE: Contiguous axial images were obtained from the base of the skull through the vertex without intravenous contrast. COMPARISON:  04/19/2014 FINDINGS: Mild generalized age-related atrophy. Stable ventricular morphology. No midline shift or mass effect. Otherwise normal appearance of brain parenchyma. No intracranial hemorrhage, mass lesion or evidence acute infarction. No extra-axial fluid collections. Visualized paranasal sinuses and mastoid air cells clear. No acute osseous abnormalities. IMPRESSION: No  acute intracranial abnormalities. Findings called to Dr. Corky Downs on 08/20/2015 at 1310 hours. Electronically Signed   By: Lavonia Dana M.D.   On: 08/20/2015 13:11   Ct Angio Neck W/cm &/or Wo/cm  08/20/2015  CLINICAL DATA:  Unable to remember words or form sentences since  falling today. The patient reports suddenly feeling like he was falling to 1 side this morning. He was able to gently gait to the ground without significant trauma. EXAM: CT ANGIOGRAPHY HEAD AND NECK TECHNIQUE: Multidetector CT imaging of the head and neck was performed using the standard protocol during bolus administration of intravenous contrast. Multiplanar CT image reconstructions and MIPs were obtained to evaluate the vascular anatomy. Carotid stenosis measurements (when applicable) are obtained utilizing NASCET criteria, using the distal internal carotid diameter as the denominator. CONTRAST:  51mL OMNIPAQUE IOHEXOL 350 MG/ML SOLN COMPARISON:  CT head without contrast from the same day. CT head without contrast 04/19/2014 FINDINGS: CT HEAD Brain: The source images demonstrate no acute infarct. The basal ganglia are intact. The insular ribbon is normal. No focal cortical lesions are present. The posterior fossa structures are within normal limits. Calvarium and skull base: Within normal limits Paranasal sinuses: Mild mucosal thickening is present posteriorly in the left maxillary sinus. The paranasal sinuses and mastoid air cells are otherwise clear. Orbits: Negative CTA NECK Aortic arch: A 3 vessel arch configuration is present. Atherosclerotic changes are present without a significant stenosis at the origins of the great vessels. Right carotid system: The right common carotid artery is within normal limits. Minimal atherosclerotic changes are present at the right carotid bifurcation. There is proximal artifact across the internal carotid artery without significant stenosis. Moderate tortuosity is present in the cervical right ICA without  significant stenosis below the skullbase. Left carotid system: The left common carotid artery is within normal limits. There is some artifact from hyperdense contrast in the adjacent left internal jugular vein. More prominent left-sided atherosclerotic changes are present at the carotid bifurcation without a significant stenosis. The cervical left ICA is within normal limits focal low the skullbase. Vertebral arteries:The vertebral arteries both originate from the subclavian arteries without significant stenosis. The left vertebral artery is the dominant vessel. There is no significant stenosis of either vertebral artery within the neck. Skeleton: Mild multilevel endplate degenerative change is present. Uncovertebral disease and facet hypertrophy is worse right than left the multiple levels. There is moderate to severe right-sided foraminal narrowing at C2-3, C3-4, and C4-5. Vertebral body heights and alignment are maintained. No focal lytic or blastic lesions are present. Other neck: The soft tissues the neck are otherwise unremarkable. The vocal cords are midline and symmetric. No focal mucosal or submucosal lesions are present. The tongue base is within normal limits. The salivary glands are within normal limits bilaterally. The thyroid is unremarkable. No significant adenopathy is present. The lung apices demonstrate mild dependent atelectasis. CTA HEAD Anterior circulation: Atherosclerotic calcifications are present within the cavernous internal carotid arteries bilaterally. The A1 and M1 segments are normal. Are within normal limits. The anterior communicating artery is patent. The MCA bifurcations are intact bilaterally. There is moderate attenuation of distal MCA and ACA branch vessels bilaterally Posterior circulation: The left vertebral artery is the dominant vessel. The PICA origins are visualized and normal. The vertebrobasilar junction is intact. The basilar artery is normal. Both posterior cerebral  arteries originate from the basilar tip. The posterior cerebral arteries demonstrate mild segmental irregularity, more prominent distally. Venous sinuses: The dural sinuses are patent. The right transverse sinus is dominant. The straight sinus and deep cerebral veins are intact. The cortical veins are unremarkable. Anatomic variants: None. Delayed phase: The postcontrast images demonstrate no pathologic enhancement. IMPRESSION: 1. Atherosclerotic calcifications at the carotid bifurcations bilaterally, left greater than right. There is no significant associated stenosis.  2. Marked tortuosity of the cervical right ICA without significant stenosis. 3. Atherosclerotic changes within the cavernous internal carotid arteries bilaterally. 4. Moderate distal small vessel disease bilaterally without a significant proximal stenosis, aneurysm, or branch vessel occlusion. Electronically Signed   By: San Morelle M.D.   On: 08/20/2015 14:23   Mr Brain Wo Contrast  08/21/2015  CLINICAL DATA:  Acute onset confusion and unsteadiness with word-finding difficulty. EXAM: MRI HEAD WITHOUT CONTRAST TECHNIQUE: Multiplanar, multiecho pulse sequences of the brain and surrounding structures were obtained without intravenous contrast. COMPARISON:  Head CT and CTA 08/20/2015 FINDINGS: There is a moderate-sized acute left MCA territory infarct involving the superior temporal lobe and inferior parietal lobe including the posterior aspect of the operculum. This measures approximately 6 x 3 cm. A punctate focus of acute cortical infarction is noted more posteriorly in the left parieto-occipital region. There is associated cytotoxic edema without significant mass effect. No intracranial hemorrhage, mass, midline shift, or extra-axial fluid collection is seen. Ventricles and sulci are normal in size for age. Scattered, punctate foci of T2 hyperintensity in the cerebral white matter bilaterally are nonspecific but compatible with minimal  chronic small vessel ischemic disease, less than is often seen in patients of this age. Orbits are unremarkable. Minimal left maxillary sinus and minimal left ethmoid air cell mucosal thickening is noted. The mastoid air cells are clear. Major intracranial vascular flow voids are preserved. IMPRESSION: Moderate sized left temporoparietal MCA infarct. Electronically Signed   By: Logan Bores M.D.   On: 08/21/2015 10:58   US Carotid Bilateral  08/21/2015  CLINICAL DATA:  Confusion. EXAM: BILATERAL CAROTID DUPLEX ULTRASOUND TECHNIQUE: Pearline Cables scale imaging, color Doppler and duplex ultrasound were performed of bilateral carotid and vertebral arteries in the neck. COMPARISON:  None. FINDINGS: Criteria: Quantification of carotid stenosis is based on velocity parameters that correlate the residual internal carotid diameter with NASCET-based stenosis levels, using the diameter of the distal internal carotid lumen as the denominator for stenosis measurement. The following velocity measurements were obtained: RIGHT ICA:  52/16 cm/sec CCA:  [id number] cm/sec SYSTOLIC ICA/CCA RATIO:  0.7 DIASTOLIC ICA/CCA RATIO:  0.9 ECA:  69 cm/sec LEFT ICA:  53/22 cm/sec CCA:  [id number] cm/sec SYSTOLIC ICA/CCA RATIO:  0.8 DIASTOLIC ICA/CCA RATIO:  1.1 ECA:  69 cm/sec RIGHT CAROTID ARTERY: No significant plaque or stenosis is noted in the right cervical carotid vessels. RIGHT VERTEBRAL ARTERY:  Antegrade flow is noted. LEFT CAROTID ARTERY: Moderate irregular calcified plaque is noted in the right carotid bulb and proximal right internal carotid artery consistent with less than 50% diameter stenosis based on ultrasound and Doppler criteria. LEFT VERTEBRAL ARTERY:  Antegrade flow is noted. IMPRESSION: No hemodynamically significant stenosis or plaque is noted in the right cervical carotid arteries. Moderate irregular calcified plaque is noted in the right carotid bulb and proximal right internal carotid artery consistent with less than 50% diameter  stenosis based on ultrasound and Doppler criteria. Electronically Signed   By: Marijo Conception, M.D.   On: 08/21/2015 11:57       Today   Subjective:   Dalton Obrien  Feels well still having trouble with his speech  Objective:   Blood pressure 116/68, pulse 77, temperature 98.5 F (36.9 C), temperature source Oral, resp. rate 18, height 6' (1.829 m), weight 85.231 kg (187 lb 14.4 oz), SpO2 96 %.  .  Intake/Output Summary (Last 24 hours) at 08/22/15 1321 Last data filed at 08/21/15 1700  Gross per 24 hour  Intake  240 ml  Output      0 ml  Net    240 ml    Exam VITAL SIGNS: Blood pressure 116/68, pulse 77, temperature 98.5 F (36.9 C), temperature source Oral, resp. rate 18, height 6' (1.829 m), weight 85.231 kg (187 lb 14.4 oz), SpO2 96 %.  GENERAL:  75 y.o.-year-old patient lying in the bed with no acute distress.  EYES: Pupils equal, round, reactive to light and accommodation. No scleral icterus. Extraocular muscles intact.  HEENT: Head atraumatic, normocephalic. Oropharynx and nasopharynx clear.  NECK:  Supple, no jugular venous distention. No thyroid enlargement, no tenderness.  LUNGS: Normal breath sounds bilaterally, no wheezing, rales,rhonchi or crepitation. No use of accessory muscles of respiration.  CARDIOVASCULAR: S1, S2 normal. No murmurs, rubs, or gallops.  ABDOMEN: Soft, nontender, nondistended. Bowel sounds present. No organomegaly or mass.  EXTREMITIES: No pedal edema, cyanosis, or clubbing.  NEUROLOGIC: Cranial nerves II through XII are intact. Muscle strength 5/5 in all extremities. Sensation intact. Gait not checked. Expressive aphasia PSYCHIATRIC: The patient is alert and oriented x 3.  SKIN: No obvious rash, lesion, or ulcer.   Data Review     CBC w Diff: Lab Results  Component Value Date   WBC 5.3 08/21/2015   WBC 7.4 04/19/2014   HGB 13.4 08/21/2015   HGB 14.3 04/19/2014   HCT 39.5* 08/21/2015   HCT 44.3 04/19/2014   PLT 118* 08/21/2015    PLT 156 04/19/2014   LYMPHOPCT 7 08/20/2015   LYMPHOPCT 14.7 04/19/2014   MONOPCT 8 08/20/2015   MONOPCT 5.3 04/19/2014   EOSPCT 3 08/20/2015   EOSPCT 0.8 04/19/2014   BASOPCT 1 08/20/2015   BASOPCT 1.1 04/19/2014   CMP: Lab Results  Component Value Date   NA 138 08/20/2015   NA 135* 04/19/2014   K 4.1 08/20/2015   K 3.9 04/19/2014   CL 105 08/20/2015   CL 105 04/19/2014   CO2 28 08/20/2015   CO2 22 04/19/2014   BUN 17 08/20/2015   BUN 15 04/19/2014   CREATININE 0.93 08/20/2015   CREATININE 0.99 04/19/2014   PROT 7.7 08/20/2015   ALBUMIN 4.2 08/20/2015   BILITOT 1.0 08/20/2015   ALKPHOS 45 08/20/2015   AST 29 08/20/2015   ALT 18 08/20/2015  .  Micro Results No results found for this or any previous visit (from the past 240 hour(s)).      Code Status Orders        Start     Ordered   08/20/15 2142  Full code   Continuous     08/20/15 2141    Code Status History    Date Active Date Inactive Code Status Order ID Comments User Context   This patient has a current code status but no historical code status.    Advance Directive Documentation        Most Recent Value   Type of Advance Directive  Living will   Pre-existing out of facility DNR order (yellow form or pink MOST form)     "MOST" Form in Place?            Follow-up Information    Follow up with Juluis Pitch, MD In 7 days.   Specialty:  Family Medicine   Contact information:   [place] [address] [phone]       Follow up with [facility], MD In 7 days.   Specialty:  Neurology   Why:  recent stoke access  for anticoagulation due to low ef   Contact information:   [facility] [place] [address] [phone]       Discharge Medications     Medication List    STOP taking these medications        aspirin EC 81 MG tablet  Replaced by:  aspirin 325 MG tablet      TAKE these medications        aspirin 325 MG  tablet  Take 1 tablet (325 mg total) by mouth daily.     atorvastatin 20 MG tablet  Commonly known as:  LIPITOR  Take 1 tablet (20 mg total) by mouth daily at 6 PM.     desonide 0.05 % lotion  Commonly known as:  DESOWEN  Apply 1 application topically every other day.           Total Time in preparing paper work, data evaluation and todays exam - 35 minutes  Dustin Flock M.D on 08/22/2015 at 1:21 PM  [facility]   Office  [phone]

## 2015-08-22 NOTE — Progress Notes (Signed)
MD making rounds. Discharge orders received. Appointments Scheduled. Telemetry Removed. IV's removed. Prescription given to patient. Discharge paperwork provided, explained, signed and witnessed. Education handouts provided to patient. No unanswered questions. Escorted via wheelchair by [employer] representative. All belongings sent with patient and family.

## 2015-09-05 ENCOUNTER — Ambulatory Visit: Payer: Medicare HMO | Attending: Neurology | Admitting: Speech Pathology

## 2015-09-05 DIAGNOSIS — R4701 Aphasia: Secondary | ICD-10-CM | POA: Insufficient documentation

## 2015-09-06 ENCOUNTER — Encounter: Payer: Self-pay | Admitting: Speech Pathology

## 2015-09-06 NOTE — Therapy (Signed)
[place] MAIN [facility] SERVICES [address] [place], [place], [address] Phone: [phone]   Fax:  [phone]  Speech Language Pathology Evaluation  Patient Details  Name: Dalton Obrien MRN: [id number] Date of Birth: 1940/08/28 Referring Provider: Gurney Maxin MD  Encounter Date: 09/05/2015      End of Session - 09/06/15 1045    Visit Number 1   Number of Visits 25   Date for SLP Re-Evaluation 11/29/15   SLP Start Time 48   SLP Stop Time  [id number]   SLP Time Calculation (min) 65 min   Activity Tolerance Patient tolerated treatment well      Past Medical History  Diagnosis Date  . Asthma   . 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 No pertinent past surgical history.  There were no vitals filed for this visit.  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 Patient has not received speech therapy since discharged from hospital 08/22/2015. 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 Patient has not received speech therapy since discharged from hospital 08/22/2015. Patient primary complains of difficulty with comprehension and that "everything just gets jumbled up"   Patient/Family Stated Goal Improve comprehension   Pain Assessment   Pain Score 0-No pain   Prior Functional Status   Cognitive/Linguistic Baseline Within functional limits   Oral Motor/Sensory Function   Overall Oral Motor/Sensory Function Appears within functional limits for tasks assessed   Motor Speech   Overall Motor  Speech Appears within functional limits for tasks assessed   Standardized Assessments   Standardized Assessments  Western Aphasia Battery revised          Western Aphasia Battery- Revised   Spontaneous Speech                            Information content               5/10                                            Fluency                                 8/10                                           Comprehension     Yes/No questions                27/60  Auditory Word Recognition  58/60                                Sequential Commands       21/80                             Repetition                              44/100                                       Naming    Object Naming                     58/60                                           Word Fluency                        16/20                                            Sentence Completion              10/10                                          Responsive Speech             2/10                                          Aphasia Quotient                  62.6/100               Reading and Writing    Reading   67/100        Writing   74/100   Language Quotient  64.8/100                   SLP Education - 09/06/15 1044    Education provided Yes   Education Details The role of the SLP in treatment of aphasia   Person(s) Educated Patient;Spouse   Methods Explanation;Demonstration   Comprehension Verbalized understanding            SLP Long Term Goals - 09/06/15 1048    SLP LONG TERM GOAL #1   Title Patient will complete 2 unit processing tasks with 80% accuracy without the need of repetition of task instructions or significant delays in responding.   Status New   SLP LONG TERM GOAL #2   Title Patient will complete 3 unit processing tasks with 80% accuracy without the need of repetition of task instructions or significant delays in responding.  Status New   SLP LONG TERM GOAL #3   Title Patient will demonstrate reading comprehension for sentences with 80% accuracy.   Status New   SLP LONG TERM GOAL #4   Title Patient will demonstrate reading comprehension for paragraphs with 80% accuracy.   Status New   SLP LONG TERM GOAL #5   Title Patient will generate grammatical and cogent sentence to complete abstract/complex linguistic task with 80% accuracy.   Status New          Plan - 09/06/15 1047    Clinical Impression Statement At 2 weeks post onset of left CVA, this 75 year-old male is presenting with moderate aphasia characterized by difficulty with comprehension and repetition. 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 Clinical judgment revealed a relative strength of an awareness of comprehension deficits. [name] noted that his chief complaint is that "everything seems jumbled up." The patient will benefit from skilled speech therapy for restorative and compensatory treatment of verbal comprehension, reading comprehension and auditory processing.  The patient has made significant progress in comprehension since the stroke and can be expected to continue to make gains.    Speech Therapy Frequency 3x / week   Duration Other (comment)  8 weeks   Treatment/Interventions Language facilitation;SLP instruction and feedback;Compensatory strategies;Patient/family education;Other (comment)  aphasia treatment   Potential to Achieve Goals Good   Potential Considerations Ability to learn/carryover information;Severity of impairments   SLP Home Exercise Plan to be determined   Consulted and Agree with Plan of Care Patient;Family member/caregiver   Family Member Consulted wife        Problem List Patient Active Problem List   Diagnosis Date Noted  .  CVA (cerebral infarction) 08/21/2015  . TIA (transient ischemic attack) 08/20/2015  . Acute encephalopathy 08/20/2015    Wynelle Cleveland 09/06/2015, 12:09 PM  [place] MAIN [facility] SERVICES [address] [place], [place], [address] Phone: [phone]   Fax:  [phone]  Name: Dalton Obrien MRN: [id number] Date of Birth: 08-Jul-1940

## 2015-09-10 ENCOUNTER — Ambulatory Visit: Payer: Medicare HMO | Admitting: Speech Pathology

## 2015-09-10 DIAGNOSIS — R4701 Aphasia: Secondary | ICD-10-CM

## 2015-09-11 ENCOUNTER — Encounter: Payer: Self-pay | Admitting: Speech Pathology

## 2015-09-11 NOTE — Therapy (Signed)
[place] MAIN [facility] SERVICES [address] [place], [place], [address] Phone: [phone]   Fax:  [phone]  Speech Language Pathology Treatment  Patient Details  Name: JOSEFF FORSETH MRN: [id number] Date of Birth: 1941/06/17 Referring Provider: Gurney Maxin MD  Encounter Date: 09/10/2015      End of Session - 09/11/15 1252    Visit Number 2   Number of Visits 25   Date for SLP Re-Evaluation 11/29/15   SLP Start Time 1400   SLP Stop Time  1458   SLP Time Calculation (min) 58 min   Activity Tolerance Patient tolerated treatment well      Past Medical History  Diagnosis Date  . Asthma   . 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 No pertinent past surgical history.  There were no vitals filed for this visit.  Visit Diagnosis: Aphasia      Subjective Assessment - 09/11/15 1251    Subjective Patient returns with reports that his comprehension is about the same as it was during our last session. Several of his friends and family members are incorporating multimodal communication including writing and gestures to improve his comprehension.   Patient is accompained by: Family member   Currently in Pain? No/denies               ADULT SLP TREATMENT - 09/11/15 0001    General Information   Behavior/Cognition Alert;Cooperative;Pleasant mood   Treatment Provided   Treatment provided Cognitive-Linquistic   Pain Assessment   Pain Assessment No/denies pain   Cognitive-Linquistic Treatment   Treatment focused on Aphasia   Skilled Treatment Patient was 75% accurate for completing two unit processing tasks with mod cues. Patient was 70% accurate for answering two unit processing questions that required yes/no responses. Patient was 50% accurate for completing three unit processing tasks with max cues. Patient was 60% accurate for answering three unit processing questions that required yes/no responses. Patient  was 100% accurate for identifying the target verb during clinician questions.   Assessment / Recommendations / Plan   Plan Continue with current plan of care   Progression Toward Goals   Progression toward goals Progressing toward goals          SLP Education - 09/11/15 1251    Education provided Yes   Education Details multimodal communication to enhance comprehension; auditory rehabilitation to improve comprehension   Person(s) Educated Patient;Spouse   Methods Explanation;Demonstration;Verbal cues;Handout   Comprehension Verbalized understanding;Returned demonstration;Need further instruction            SLP Long Term Goals - 09/06/15 1048    SLP LONG TERM GOAL #1   Title Patient will complete 2 unit processing tasks with 80% accuracy without the need of repetition of task instructions or significant delays in responding.   Status New   SLP LONG TERM GOAL #2   Title Patient will complete 3 unit processing tasks with 80% accuracy without the need of repetition of task instructions or significant delays in responding.   Status New   SLP LONG TERM GOAL #3   Title Patient will demonstrate reading comprehension for sentences with 80% accuracy.   Status New   SLP LONG TERM GOAL #4   Title Patient will demonstrate reading comprehension for paragraphs with 80% accuracy.   Status New   SLP LONG TERM GOAL #5   Title Patient will generate grammatical and cogent sentence to complete abstract/complex linguistic task with  80% accuracy.   Status New          Plan - 09/11/15 1252    Clinical Impression Statement Patient continues to respond well to multimodal communication to enhance comprehension. The written word is a very powerful tool in this respect as the Patient is able to comprehend very well at the sentence level when he is provided written cues. Patient also responded well to gestures and emphasis of key words when he had difficulty comprehending. Continue to target auditory  rehabilitation at the two and three unit processing level. Patient is very motivated and shows great promise for positive outcomes in speech-language therapy.   Speech Therapy Frequency 3x / week   Duration Other (comment)  8 weeks   Treatment/Interventions Language facilitation;SLP instruction and feedback;Compensatory strategies;Patient/family education;Other (comment)   Potential to Achieve Goals Good   Potential Considerations Ability to learn/carryover information;Severity of impairments   SLP Home Exercise Plan to be determined   Consulted and Agree with Plan of Care Patient;Family member/caregiver   Family Member Consulted wife        Problem List Patient Active Problem List   Diagnosis Date Noted  . CVA (cerebral infarction) 08/21/2015  . TIA (transient ischemic attack) 08/20/2015  . Acute encephalopathy 08/20/2015    Wynelle Cleveland 09/11/2015, 12:53 PM  [place] MAIN [facility] SERVICES [address] [place], [place], [address] Phone: [phone]   Fax:  [phone]   Name: JAXTIN MIHELIC MRN: [id number] Date of Birth: 1940/08/15

## 2015-09-12 ENCOUNTER — Ambulatory Visit: Payer: Medicare HMO | Admitting: Speech Pathology

## 2015-09-12 DIAGNOSIS — R4701 Aphasia: Secondary | ICD-10-CM

## 2015-09-13 ENCOUNTER — Encounter: Payer: Self-pay | Admitting: Speech Pathology

## 2015-09-13 ENCOUNTER — Ambulatory Visit: Payer: Medicare HMO | Admitting: Speech Pathology

## 2015-09-13 DIAGNOSIS — R4701 Aphasia: Secondary | ICD-10-CM

## 2015-09-13 NOTE — Therapy (Signed)
[place] MAIN [facility] SERVICES [address] [place], [place], [address] Phone: [phone]   Fax:  [phone]  Speech Language Pathology Treatment  Patient Details  Name: Dalton Obrien MRN: [id number] Date of Birth: 08/30/1940 Referring Provider: Gurney Maxin MD  Encounter Date: 09/12/2015      End of Session - 09/13/15 0852    Visit Number 3   Number of Visits 25   Date for SLP Re-Evaluation 11/29/15   SLP Start Time 50   SLP Stop Time  1500   SLP Time Calculation (min) 60 min   Activity Tolerance Patient tolerated treatment well      Past Medical History  Diagnosis Date  . Asthma   . 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 No pertinent past surgical history.  There were no vitals filed for this visit.  Visit Diagnosis: Aphasia      Subjective Assessment - 09/13/15 0851    Subjective Patient returns with reports that his verbal comprehension is about the same as it was during our last session, and continues to capitalize on his strength of written comprehension to improve communication.    Patient is accompained by: Family member   Currently in Pain? No/denies               ADULT SLP TREATMENT - 09/13/15 0001    General Information   Behavior/Cognition Alert;Cooperative;Pleasant mood   Treatment Provided   Treatment provided Cognitive-Linquistic   Pain Assessment   Pain Assessment No/denies pain   Cognitive-Linquistic Treatment   Treatment focused on Aphasia   Skilled Treatment Patient was 80% accurate for completing two unit processing tasks with mod cues. Patient was 75% accurate for answering two unit processing questions that required yes/no responses. Patient was 60% accurate for completing three unit processing tasks with max cues. Patient was 65% accurate for answering three unit processing questions that required yes/no responses.    Assessment / Recommendations / Plan   Plan  Continue with current plan of care   Progression Toward Goals   Progression toward goals Progressing toward goals          SLP Education - 09/13/15 0851    Education provided Yes   Education Details multimodal communication to enhance comprehension; auditory rehabilitation to improve comprehension   Person(s) Educated Patient;Spouse   Methods Explanation;Demonstration;Verbal cues;Handout   Comprehension Verbalized understanding;Returned demonstration;Need further instruction            SLP Long Term Goals - 09/06/15 1048    SLP LONG TERM GOAL #1   Title Patient will complete 2 unit processing tasks with 80% accuracy without the need of repetition of task instructions or significant delays in responding.   Status New   SLP LONG TERM GOAL #2   Title Patient will complete 3 unit processing tasks with 80% accuracy without the need of repetition of task instructions or significant delays in responding.   Status New   SLP LONG TERM GOAL #3   Title Patient will demonstrate reading comprehension for sentences with 80% accuracy.   Status New   SLP LONG TERM GOAL #4   Title Patient will demonstrate reading comprehension for paragraphs with 80% accuracy.   Status New   SLP LONG TERM GOAL #5   Title Patient will generate grammatical and cogent sentence to complete abstract/complex linguistic task with 80% accuracy.   Status New  Plan - 09/13/15 [id number]    Clinical Impression Statement Patient continues to respond well to multimodal communication to enhance comprehension. The written word is a very powerful tool in this respect as the Patient is able to comprehend very well at the sentence level when he is provided written cues. Today he incorporated writing to assist him in verbal comprehension. We discussed strategies for the Patient to work on enhancing comprehension at home, including watching favorite movies and TV shows with the captions on. Continue to target auditory  rehabilitation at the two and three unit processing level. Patient is very motivated and shows great promise for positive outcomes in speech-language therapy.   Speech Therapy Frequency 3x / week   Duration Other (comment)  8 weeks   Treatment/Interventions Language facilitation;SLP instruction and feedback;Compensatory strategies;Patient/family education;Other (comment)   Potential to Achieve Goals Good   Potential Considerations Ability to learn/carryover information;Severity of impairments   SLP Home Exercise Plan watch tv shows with subtitles to rehab the auditory system and improve receptive language   Consulted and Agree with Plan of Care Patient;Family member/caregiver   Family Member Consulted spouse        Problem List Patient Active Problem List   Diagnosis Date Noted  . CVA (cerebral infarction) 08/21/2015  . TIA (transient ischemic attack) 08/20/2015  . Acute encephalopathy 08/20/2015    Wynelle Cleveland 09/13/2015, 8:53 AM  [place] MAIN [facility] SERVICES [address] [place], [place], [address] Phone: [phone]   Fax:  [phone]   Name: GAVYNN POLE MRN: [id number] Date of Birth: 11-05-1940

## 2015-09-13 NOTE — Therapy (Signed)
[place] MAIN [facility] SERVICES [address] [place], [place], [address] Phone: [phone]   Fax:  [phone]  Speech Language Pathology Treatment  Patient Details  Name: Dalton Obrien MRN: [id number] Date of Birth: [birth year] Referring Provider: Gurney Maxin MD  Encounter Date: 09/13/2015      End of Session - 09/13/15 1513    Visit Number 4   Number of Visits 25   Date for SLP Re-Evaluation 11/29/15   SLP Start Time 47   SLP Stop Time  1500   SLP Time Calculation (min) 60 min   Activity Tolerance Patient tolerated treatment well      Past Medical History  Diagnosis Date  . Asthma   . 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 No pertinent past surgical history.  There were no vitals filed for this visit.  Visit Diagnosis: Aphasia      Subjective Assessment - 09/13/15 1512    Subjective Patient returns with reports that his verbal comprehension has slightly improved, and that he continues to capitalize on his strengths of written comprehension and comprehension of gestural cues to improve receptive communication.    Patient is accompained by: Family member   Currently in Pain? No/denies               ADULT SLP TREATMENT - 09/13/15 1511    General Information   Behavior/Cognition Alert;Cooperative;Pleasant mood   Treatment Provided   Treatment provided Cognitive-Linquistic   Pain Assessment   Pain Assessment No/denies pain   Cognitive-Linquistic Treatment   Treatment focused on Aphasia   Skilled Treatment Patient was 75% accurate for answering two unit processing questions that required yes/no responses without written cues. Patient was 100% accurate for answering two unit processing questions that required yes/no responses when written cues were available. Patient was 75% accurate for answering three unit processing questions that required yes/no responses without written cues. Patient was  100% accurate for answering three unit processing questions that required yes/no responses when written cues were available. Patient was 60% accurate for answering open-ended questions without written cues. Patient was 90% accurate for answering open-ended questions when written cues were available.   Assessment / Recommendations / Plan   Plan Continue with current plan of care   Progression Toward Goals   Progression toward goals Progressing toward goals          SLP Education - 09/13/15 1512    Education provided Yes   Education Details multimodal communication to enhance comprehension; auditory rehabilitation to improve comprehension   Person(s) Educated Patient;Spouse   Methods Explanation;Demonstration;Verbal cues;Handout   Comprehension Verbalized understanding;Returned demonstration;Need further instruction            SLP Long Term Goals - 09/06/15 1048    SLP LONG TERM GOAL #1   Title Patient will complete 2 unit processing tasks with 80% accuracy without the need of repetition of task instructions or significant delays in responding.   Status New   SLP LONG TERM GOAL #2   Title Patient will complete 3 unit processing tasks with 80% accuracy without the need of repetition of task instructions or significant delays in responding.   Status New   SLP LONG TERM GOAL #3   Title Patient will demonstrate reading comprehension for sentences with 80% accuracy.   Status New   SLP LONG TERM GOAL #4   Title Patient will demonstrate reading comprehension for paragraphs with 80% accuracy.  Status New   SLP LONG TERM GOAL #5   Title Patient will generate grammatical and cogent sentence to complete abstract/complex linguistic task with 80% accuracy.   Status New          Plan - 09/13/15 1513    Clinical Impression Statement Patient continues to respond well to multimodal communication to enhance comprehension, especially with the written word. Today he continued to incorporate  writing to assist him in verbal comprehension. The Patient plans to work on strategies we discussed, including watching his favorite movies and TV shows with the captions on, over the weekend to improve his receptive communication. Continue to target auditory rehabilitation at the two and three unit processing level. Patient is very motivated and shows great promise for positive outcomes in speech-language therapy.   Speech Therapy Frequency 3x / week   Duration Other (comment)  8 weeks   Treatment/Interventions Language facilitation;SLP instruction and feedback;Compensatory strategies;Patient/family education;Other (comment)   Potential to Achieve Goals Good   Potential Considerations Ability to learn/carryover information;Severity of impairments   SLP Home Exercise Plan watch tv shows with subtitles to rehab the auditory system and improve receptive language   Consulted and Agree with Plan of Care Patient;Family member/caregiver   Family Member Consulted spouse        Problem List Patient Active Problem List   Diagnosis Date Noted  . CVA (cerebral infarction) 08/21/2015  . TIA (transient ischemic attack) 08/20/2015  . Acute encephalopathy 08/20/2015    Wynelle Cleveland 09/13/2015, 3:14 PM  [place] MAIN [facility] SERVICES [address] [place], [place], [address] Phone: [phone]   Fax:  [phone]   Name: ARMANIE DOSSETT MRN: [id number] Date of Birth: 26-Jan-1941

## 2015-09-16 ENCOUNTER — Ambulatory Visit: Payer: Medicare HMO | Admitting: Speech Pathology

## 2015-09-16 DIAGNOSIS — R4701 Aphasia: Secondary | ICD-10-CM | POA: Diagnosis not present

## 2015-09-17 ENCOUNTER — Encounter: Payer: Self-pay | Admitting: Speech Pathology

## 2015-09-17 NOTE — Therapy (Signed)
[place] MAIN [facility] SERVICES [address] [place], [place], [address] Phone: [phone]   Fax:  [phone]  Speech Language Pathology Treatment  Patient Details  Name: Dalton Obrien MRN: [id number] Date of Birth: 09/11/1940 Referring Provider: Gurney Maxin MD  Encounter Date: 09/16/2015      End of Session - 09/17/15 0820    Visit Number 5   Number of Visits 25   Date for SLP Re-Evaluation 11/29/15   SLP Start Time 1000   SLP Stop Time  1100   SLP Time Calculation (min) 60 min   Activity Tolerance Patient tolerated treatment well      Past Medical History  Diagnosis Date  . Asthma   . 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 No pertinent past surgical history.  There were no vitals filed for this visit.  Visit Diagnosis: Aphasia      Subjective Assessment - 09/17/15 0818    Subjective Patient feels that he is listening better, but continues to have difficulty with larger groups.   Patient is accompained by: Family member   Currently in Pain? No/denies               ADULT SLP TREATMENT - 09/17/15 0001    General Information   Behavior/Cognition Alert;Cooperative;Pleasant mood   Treatment Provided   Treatment provided Cognitive-Linquistic   Pain Assessment   Pain Assessment No/denies pain   Cognitive-Linquistic Treatment   Treatment focused on Aphasia   Skilled Treatment AUDITORY COMPREHENSION: Follow 2-unit psychomotor commands with 75% accuracy.  Identify picture given function with 100% accuracy.  Identify 2 pictures with 90% accuracy.  Answer 2-unit yes/no questions with 85% accuracy.  Follow 3-unit commands to manipulate pictures with 50% accuracy given no repetition or cues and 75% accuracy given one repetition of the stimulus.  Answer 3-unit yes/no questions with 75% accuracy.  READING COMPREHENSION: select word given 4 choices to complete written sentences with 90% accuracy.   Read/comprehend written sentences to complete logical solutions (planning projects) independently after teaching task.   Assessment / Recommendations / Plan   Plan Continue with current plan of care   Progression Toward Goals   Progression toward goals Progressing toward goals          SLP Education - 09/17/15 0818    Education provided Yes   Education Details use reading and writing skills to improve listening skills   Person(s) Educated Patient;Spouse   Methods Explanation   Comprehension Verbalized understanding            SLP Long Term Goals - 09/06/15 1048    SLP LONG TERM GOAL #1   Title Patient will complete 2 unit processing tasks with 80% accuracy without the need of repetition of task instructions or significant delays in responding.   Status New   SLP LONG TERM GOAL #2   Title Patient will complete 3 unit processing tasks with 80% accuracy without the need of repetition of task instructions or significant delays in responding.   Status New   SLP LONG TERM GOAL #3   Title Patient will demonstrate reading comprehension for sentences with 80% accuracy.   Status New   SLP LONG TERM GOAL #4   Title Patient will demonstrate reading comprehension for paragraphs with 80% accuracy.   Status New   SLP LONG TERM GOAL #5   Title Patient will generate grammatical and cogent sentence to complete abstract/complex linguistic task with  80% accuracy.   Status New          Plan - 09/17/15 0820    Clinical Impression Statement The patient is improving in oral and written comprehension.  He has been engaging in social interaction with greater success.     Speech Therapy Frequency 3x / week   Duration Other (comment)   Treatment/Interventions Language facilitation;SLP instruction and feedback;Compensatory strategies;Patient/family education;Other (comment)   Potential to Achieve Goals Good   Potential Considerations Ability to learn/carryover information;Severity of  impairments;Previous level of function;Cooperation/participation level   SLP Home Exercise Plan watch tv shows with subtitles to rehab the auditory system and improve receptive language   Consulted and Agree with Plan of Care Patient;Family member/caregiver   Family Member Consulted spouse        Problem List Patient Active Problem List   Diagnosis Date Noted  . CVA (cerebral infarction) 08/21/2015  . TIA (transient ischemic attack) 08/20/2015  . Acute encephalopathy 08/20/2015   Leroy Sea, MS/CCC- SLP  Lou Miner 09/17/2015, 8:21 AM  [place] MAIN [facility] SERVICES [address] [place], [place], [address] Phone: [phone]   Fax:  [phone]   Name: Dalton Obrien MRN: [id number] Date of Birth: 09/19/1940

## 2015-09-18 ENCOUNTER — Ambulatory Visit: Payer: Medicare HMO | Admitting: Speech Pathology

## 2015-09-18 DIAGNOSIS — R4701 Aphasia: Secondary | ICD-10-CM

## 2015-09-19 ENCOUNTER — Ambulatory Visit: Payer: Medicare HMO | Admitting: Speech Pathology

## 2015-09-19 ENCOUNTER — Encounter: Payer: Self-pay | Admitting: Speech Pathology

## 2015-09-19 NOTE — Therapy (Signed)
[place] MAIN [facility] SERVICES [address] [place], [place], [address] Phone: [phone]   Fax:  [phone]  Speech Language Pathology Treatment  Patient Details  Name: ESHAAN KITZMANN MRN: [id number] Date of Birth: [birth year] Referring Provider: Gurney Maxin MD  Encounter Date: 09/18/2015      End of Session - 09/19/15 1020    Visit Number 6   Number of Visits 25   Date for SLP Re-Evaluation 11/29/15   SLP Start Time 0900   SLP Stop Time  1000   SLP Time Calculation (min) 60 min   Activity Tolerance Patient tolerated treatment well      Past Medical History  Diagnosis Date  . Asthma   . 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 No pertinent past surgical history.  There were no vitals filed for this visit.  Visit Diagnosis: Aphasia      Subjective Assessment - 09/19/15 1019    Subjective Patient feels that he is listening better, but continues to have difficulty with larger groups.   Patient is accompained by: Family member   Currently in Pain? No/denies               ADULT SLP TREATMENT - 09/19/15 0001    General Information   Behavior/Cognition Alert;Cooperative;Pleasant mood   Treatment Provided   Treatment provided Cognitive-Linquistic   Pain Assessment   Pain Assessment No/denies pain   Cognitive-Linquistic Treatment   Treatment focused on Aphasia   Skilled Treatment AUDITORY COMPREHENSION: Follow 3-unit psychomotor commands with 60% accuracy; increases to 90% given one repetition of stimulus.  Identify 3 pictures with 65% accuracy; increases to 95% given one repetition of stimulus.  Answer general knowledge questions with 65% accuracy; increases to 85% given one repetition of stimulus.  READING COMPREHENSION: Read/comprehend written sentences to complete logical solutions (planning projects) with 80% accuracy; error due to faulty decoding of complex grammatic structure.   Assessment /  Recommendations / Plan   Plan Continue with current plan of care   Progression Toward Goals   Progression toward goals Progressing toward goals          SLP Education - 09/19/15 1019    Education provided Yes   Education Details To improve reading comprehension: try reading juvinile books in subjects you are interested in.   Person(s) Educated Patient;Spouse   Methods Explanation   Comprehension Verbalized understanding            SLP Long Term Goals - 09/06/15 1048    SLP LONG TERM GOAL #1   Title Patient will complete 2 unit processing tasks with 80% accuracy without the need of repetition of task instructions or significant delays in responding.   Status New   SLP LONG TERM GOAL #2   Title Patient will complete 3 unit processing tasks with 80% accuracy without the need of repetition of task instructions or significant delays in responding.   Status New   SLP LONG TERM GOAL #3   Title Patient will demonstrate reading comprehension for sentences with 80% accuracy.   Status New   SLP LONG TERM GOAL #4   Title Patient will demonstrate reading comprehension for paragraphs with 80% accuracy.   Status New   SLP LONG TERM GOAL #5   Title Patient will generate grammatical and cogent sentence to complete abstract/complex linguistic task with 80% accuracy.   Status New          Plan -  09/19/15 1021    Clinical Impression Statement The patient is improving in oral and written comprehension.  He has been engaging in social interaction with greater success.     Speech Therapy Frequency 3x / week   Duration Other (comment)   Treatment/Interventions Language facilitation;SLP instruction and feedback;Compensatory strategies;Patient/family education;Other (comment)   Potential to Achieve Goals Good   Potential Considerations Ability to learn/carryover information;Severity of impairments;Previous level of function;Cooperation/participation level   SLP Home Exercise Plan Read  newspaper artical, then discuss with wife   Consulted and Agree with Plan of Care Patient;Family member/caregiver   Family Member Consulted spouse        Problem List Patient Active Problem List   Diagnosis Date Noted  . CVA (cerebral infarction) 08/21/2015  . TIA (transient ischemic attack) 08/20/2015  . Acute encephalopathy 08/20/2015   Leroy Sea, MS/CCC- SLP  Lou Miner 09/19/2015, 10:22 AM  [place] MAIN [facility] SERVICES [address] [place], [place], [address] Phone: [phone]   Fax:  [phone]   Name: DEVENDER CHILLIS MRN: [id number] Date of Birth: [birth year]

## 2015-09-20 ENCOUNTER — Ambulatory Visit: Payer: Medicare HMO | Admitting: Speech Pathology

## 2015-09-20 ENCOUNTER — Encounter: Payer: Self-pay | Admitting: Speech Pathology

## 2015-09-20 DIAGNOSIS — R4701 Aphasia: Secondary | ICD-10-CM

## 2015-09-20 NOTE — Therapy (Signed)
[place] MAIN [facility] SERVICES [address] [place], [place], [address] Phone: [phone]   Fax:  [phone]  Speech Language Pathology Treatment  Patient Details  Name: Dalton Obrien MRN: [id number] Date of Birth: [birth year] Referring Provider: Gurney Maxin MD  Encounter Date: 09/20/2015      End of Session - 09/20/15 1526    Visit Number 7   Number of Visits 25   Date for SLP Re-Evaluation 11/29/15   SLP Start Time 1000   SLP Stop Time  1107   SLP Time Calculation (min) 67 min   Activity Tolerance Patient tolerated treatment well      Past Medical History  Diagnosis Date  . Asthma   . 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 No pertinent past surgical history.  There were no vitals filed for this visit.  Visit Diagnosis: Aphasia      Subjective Assessment - 09/20/15 1525    Subjective Patient has gotten juvinile level reading material from [employer]   Patient is accompained by: Family member   Currently in Pain? No/denies               ADULT SLP TREATMENT - 09/20/15 0001    General Information   Behavior/Cognition Alert;Cooperative;Pleasant mood   Treatment Provided   Treatment provided Cognitive-Linquistic   Pain Assessment   Pain Assessment No/denies pain   Cognitive-Linquistic Treatment   Treatment focused on Aphasia   Skilled Treatment AUDITORY COMPREHENSION: Follow 3-unit psychomotor commands with 85% accuracy; increases to 90% given one repetition of stimulus.  Answer 3-unit yes/no questions with 60% accuracy; increases to 80% given one repetition of stimulus.  READING COMPREHENSION: Not able to read/comprehend written paragraphs to complete logical solutions (schedule planning).   Read/comprehend written syllogisms with 40% accuracy independently.  Requires mod to max support to complete the remaining items.    Assessment / Recommendations / Plan   Plan Continue with current plan  of care   Progression Toward Goals   Progression toward goals Progressing toward goals          SLP Education - 09/20/15 1526    Education provided Yes   Education Details Information regarding drining assessment available at [facility]) Educated Patient;Spouse   Methods Explanation   Comprehension Verbalized understanding            SLP Long Term Goals - 09/06/15 1048    SLP LONG TERM GOAL #1   Title Patient will complete 2 unit processing tasks with 80% accuracy without the need of repetition of task instructions or significant delays in responding.   Status New   SLP LONG TERM GOAL #2   Title Patient will complete 3 unit processing tasks with 80% accuracy without the need of repetition of task instructions or significant delays in responding.   Status New   SLP LONG TERM GOAL #3   Title Patient will demonstrate reading comprehension for sentences with 80% accuracy.   Status New   SLP LONG TERM GOAL #4   Title Patient will demonstrate reading comprehension for paragraphs with 80% accuracy.   Status New   SLP LONG TERM GOAL #5   Title Patient will generate grammatical and cogent sentence to complete abstract/complex linguistic task with 80% accuracy.   Status New          Plan - 09/20/15 1527    Clinical Impression Statement The patient is improving in oral  and written comprehension.  He has been engaging in social interaction with greater success.     Speech Therapy Frequency 3x / week   Duration Other (comment)   Treatment/Interventions Language facilitation;SLP instruction and feedback;Compensatory strategies;Patient/family education;Other (comment)   Potential to Achieve Goals Good   Potential Considerations Ability to learn/carryover information;Severity of impairments;Previous level of function;Cooperation/participation level   SLP Home Exercise Plan Try elementry or middle school text book with questions for reading practice   Consulted and Agree with  Plan of Care Patient;Family member/caregiver   Family Member Consulted spouse        Problem List Patient Active Problem List   Diagnosis Date Noted  . CVA (cerebral infarction) 08/21/2015  . TIA (transient ischemic attack) 08/20/2015  . Acute encephalopathy 08/20/2015   Leroy Sea, MS/CCC- SLP  Lou Miner 09/20/2015, 3:28 PM  [place] MAIN [facility] SERVICES [address] [place], [place], [address] Phone: [phone]   Fax:  [phone]   Name: Dalton Obrien MRN: [id number] Date of Birth: October 18, 1940

## 2015-09-24 ENCOUNTER — Ambulatory Visit: Payer: Medicare HMO | Admitting: Speech Pathology

## 2015-09-24 DIAGNOSIS — R4701 Aphasia: Secondary | ICD-10-CM | POA: Diagnosis not present

## 2015-09-25 ENCOUNTER — Encounter: Payer: Self-pay | Admitting: Speech Pathology

## 2015-09-25 NOTE — Therapy (Signed)
[place] MAIN [facility] SERVICES [address] [place], [place], [address] Phone: [phone]   Fax:  [phone]  Speech Language Pathology Treatment  Patient Details  Name: Dalton Obrien MRN: [id number] Date of Birth: Nov 19, 1940 Referring Provider: Gurney Maxin MD  Encounter Date: 09/24/2015      End of Session - 09/25/15 0949    Visit Number 8   Number of Visits 25   Date for SLP Re-Evaluation 11/29/15   SLP Start Time 1500   SLP Stop Time  1600   SLP Time Calculation (min) 60 min   Activity Tolerance Patient tolerated treatment well      Past Medical History  Diagnosis Date  . Asthma   . 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 No pertinent past surgical history.  There were no vitals filed for this visit.  Visit Diagnosis: Aphasia      Subjective Assessment - 09/25/15 0948    Subjective Patient returns with reports that his verbal comprehension has improved and that he rarely requires written cues to aid his comprehension.   Patient is accompained by: Family member   Currently in Pain? No/denies               ADULT SLP TREATMENT - 09/25/15 0001    General Information   Behavior/Cognition Alert;Cooperative;Pleasant mood   Treatment Provided   Treatment provided Cognitive-Linquistic   Pain Assessment   Pain Assessment No/denies pain   Cognitive-Linquistic Treatment   Treatment focused on Aphasia   Skilled Treatment Patient was 40% accurate for completing syllogisms without written cues and with verbal repetitions from clinician. Patient was 70% accurate for completing syllogisms when written cues were available. Patient was 50% accurate for following multi-unit psychomotor commands; increased to 75% given one repetition of stimulus. Patient was 70% accurate for answering open-ended questions without written cues. Patient was 90% accurate for answering open-ended questions when written cues were  available.   Assessment / Recommendations / Plan   Plan Continue with current plan of care   Progression Toward Goals   Progression toward goals Progressing toward goals          SLP Education - 09/25/15 0948    Education provided Yes   Education Details organizational strategies to improve comprehension of syllogisms   Person(s) Educated Patient;Spouse   Methods Explanation;Demonstration   Comprehension Verbalized understanding            SLP Long Term Goals - 09/06/15 1048    SLP LONG TERM GOAL #1   Title Patient will complete 2 unit processing tasks with 80% accuracy without the need of repetition of task instructions or significant delays in responding.   Status New   SLP LONG TERM GOAL #2   Title Patient will complete 3 unit processing tasks with 80% accuracy without the need of repetition of task instructions or significant delays in responding.   Status New   SLP LONG TERM GOAL #3   Title Patient will demonstrate reading comprehension for sentences with 80% accuracy.   Status New   SLP LONG TERM GOAL #4   Title Patient will demonstrate reading comprehension for paragraphs with 80% accuracy.   Status New   SLP LONG TERM GOAL #5   Title Patient will generate grammatical and cogent sentence to complete abstract/complex linguistic task with 80% accuracy.   Status New          Plan - 09/25/15 [id number]  Clinical Impression Statement The patient is improving in oral and written comprehension.  He has been engaging in social interaction with greater success.     Speech Therapy Frequency 3x / week   Duration Other (comment)  8 weeks   Treatment/Interventions Language facilitation;SLP instruction and feedback;Compensatory strategies;Patient/family education;Other (comment)   Potential to Achieve Goals Good   Potential Considerations Ability to learn/carryover information;Severity of impairments;Previous level of function;Cooperation/participation level   SLP Home  Exercise Plan Try elementry or middle school text book with questions for reading practice   Consulted and Agree with Plan of Care Patient;Family member/caregiver   Family Member Consulted spouse        Problem List Patient Active Problem List   Diagnosis Date Noted  . CVA (cerebral infarction) 08/21/2015  . TIA (transient ischemic attack) 08/20/2015  . Acute encephalopathy 08/20/2015    Wynelle Cleveland 09/25/2015, 9:50 AM  [place] MAIN [facility] SERVICES [address] [place], [place], [address] Phone: [phone]   Fax:  [phone]   Name: Dalton Obrien MRN: [id number] Date of Birth: Aug 21, 1940

## 2015-09-27 ENCOUNTER — Encounter: Payer: Self-pay | Admitting: Speech Pathology

## 2015-09-27 ENCOUNTER — Ambulatory Visit: Payer: Medicare HMO | Admitting: Speech Pathology

## 2015-09-27 DIAGNOSIS — R4701 Aphasia: Secondary | ICD-10-CM

## 2015-09-27 NOTE — Therapy (Signed)
[place] MAIN [facility] SERVICES [address] [place], [place], [address] Phone: [phone]   Fax:  [phone]  Speech Language Pathology Treatment  Patient Details  Name: Dalton Obrien MRN: [id number] Date of Birth: 1940-10-26 Referring Provider: Gurney Maxin MD  Encounter Date: 09/27/2015      End of Session - 09/27/15 1631    Visit Number 9   Number of Visits 25   Date for SLP Re-Evaluation 11/29/15   SLP Start Time 1508   SLP Stop Time  1600   SLP Time Calculation (min) 52 min   Activity Tolerance Patient tolerated treatment well      Past Medical History  Diagnosis Date  . Asthma   . 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 No pertinent past surgical history.  There were no vitals filed for this visit.  Visit Diagnosis: Aphasia      Subjective Assessment - 09/27/15 1630    Subjective Patient returns with reports that he has been working hard at home and his verbal comprehension continues to improve.   Patient is accompained by: Family member   Currently in Pain? No/denies               ADULT SLP TREATMENT - 09/27/15 0001    General Information   Behavior/Cognition Alert;Cooperative;Pleasant mood   Treatment Provided   Treatment provided Cognitive-Linquistic   Pain Assessment   Pain Assessment No/denies pain   Cognitive-Linquistic Treatment   Treatment focused on Aphasia   Skilled Treatment Patient was 60% accurate for identifying key words that made sentences illogical; increased to 70% given one repetition of stimulus; increased to 90% with written cues. Patient was 50% accurate for replacing key words that made sentences illogical; increased to 60% given one repetition of stimulus; increased to 90% with written cues. Patient was 70% accurate for answering open-ended questions without written cues. Patient was 90% accurate for answering open-ended questions when written cues were  available.   Assessment / Recommendations / Plan   Plan Continue with current plan of care   Progression Toward Goals   Progression toward goals Progressing toward goals          SLP Education - 09/27/15 1630    Education provided Yes   Education Details organizational strategies to improve comprehension   Person(s) Educated Patient;Spouse   Methods Explanation;Demonstration   Comprehension Verbalized understanding            SLP Long Term Goals - 09/06/15 1048    SLP LONG TERM GOAL #1   Title Patient will complete 2 unit processing tasks with 80% accuracy without the need of repetition of task instructions or significant delays in responding.   Status New   SLP LONG TERM GOAL #2   Title Patient will complete 3 unit processing tasks with 80% accuracy without the need of repetition of task instructions or significant delays in responding.   Status New   SLP LONG TERM GOAL #3   Title Patient will demonstrate reading comprehension for sentences with 80% accuracy.   Status New   SLP LONG TERM GOAL #4   Title Patient will demonstrate reading comprehension for paragraphs with 80% accuracy.   Status New   SLP LONG TERM GOAL #5   Title Patient will generate grammatical and cogent sentence to complete abstract/complex linguistic task with 80% accuracy.   Status New          Plan -  09/27/15 1631    Clinical Impression Statement Patient continues to respond well to multimodal communication to enhance comprehension, especially with the written word and gestures. Today he continued to incorporate writing to assist him in verbal comprehension. He is improving in auditory comprehension as evidenced by his understanding of abstract ideas at the sentence level. He plans to explore "aphasia friendly news" from [place] over the weekend in order to improve comprehension of current events. Continue to target auditory rehabilitation at the two and three unit processing level. Patient is very  motivated and shows great promise for positive outcomes in speech-language therapy.   Speech Therapy Frequency 3x / week   Duration Other (comment)  8 weeks   Treatment/Interventions Language facilitation;SLP instruction and feedback;Compensatory strategies;Patient/family education;Other (comment)   Potential to Achieve Goals Good   Potential Considerations Ability to learn/carryover information;Severity of impairments;Previous level of function;Cooperation/participation level   SLP Home Exercise Plan explore "aphasia friendly news" from [place] and Agree with Plan of Care Patient;Family member/caregiver   Family Member Consulted spouse        Problem List Patient Active Problem List   Diagnosis Date Noted  . CVA (cerebral infarction) 08/21/2015  . TIA (transient ischemic attack) 08/20/2015  . Acute encephalopathy 08/20/2015    Wynelle Cleveland 09/27/2015, 4:32 PM  [place] MAIN [facility] SERVICES [address] [place], [place], [address] Phone: [phone]   Fax:  [phone]   Name: Dalton Obrien MRN: [id number] Date of Birth: 08/28/1940

## 2015-10-01 ENCOUNTER — Ambulatory Visit: Payer: Medicare HMO | Admitting: Speech Pathology

## 2015-10-01 ENCOUNTER — Encounter: Payer: Self-pay | Admitting: Speech Pathology

## 2015-10-01 DIAGNOSIS — R4701 Aphasia: Secondary | ICD-10-CM

## 2015-10-01 NOTE — Therapy (Signed)
[place] MAIN [facility] SERVICES [address] [place], [place], [address] Phone: [phone]   Fax:  [phone]  Speech Language Pathology Treatment / Progress Note  Patient Details  Name: Dalton Obrien MRN: [id number] Date of Birth: August 08, 1940 Referring Provider: Gurney Maxin MD  Encounter Date: 10/01/2015      End of Session - 10/01/15 1326    Visit Number 10   Number of Visits 25   Date for SLP Re-Evaluation 11/29/15   SLP Start Time 1000   SLP Stop Time  1100   SLP Time Calculation (min) 60 min   Activity Tolerance Patient tolerated treatment well      Past Medical History  Diagnosis Date  . Asthma   . 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 No pertinent past surgical history.  There were no vitals filed for this visit.  Visit Diagnosis: Aphasia      Subjective Assessment - 10/01/15 1325    Subjective Patient returns with reports that he has been working hard at home and his verbal comprehension continues to improve.   Patient is accompained by: Family member   Currently in Pain? No/denies               ADULT SLP TREATMENT - 10/01/15 0001    General Information   Behavior/Cognition Alert;Cooperative;Pleasant mood   Treatment Provided   Treatment provided Cognitive-Linquistic   Pain Assessment   Pain Assessment No/denies pain   Cognitive-Linquistic Treatment   Treatment focused on Aphasia   Skilled Treatment Patient was 80% accurate for repetition of target words. Patient was 100% accurate for two-unit answering yes-no questions. Patient was 60% accurate for executing complex psychomotor commands. Patient was 60% accurate for identifying key words that made sentences illogical; increased to 70% given one repetition of stimulus; increased to 90% with written cues. Patient was 50% accurate for replacing key words that made sentences illogical; increased to 60% given one repetition of stimulus;  increased to 90% with written cues.    Assessment / Recommendations / Plan   Plan Continue with current plan of care   Progression Toward Goals   Progression toward goals Progressing toward goals          SLP Education - 10/01/15 1325    Education provided Yes   Education Details organizational strategies to improve comprehension   Person(s) Educated Patient;Spouse   Methods Explanation;Demonstration   Comprehension Verbalized understanding            SLP Long Term Goals - 10/01/15 1327    SLP LONG TERM GOAL #1   Title Patient will complete 2 unit processing tasks with 80% accuracy without the need of repetition of task instructions or significant delays in responding.   Status Partially Met   SLP LONG TERM GOAL #2   Title Patient will complete 3 unit processing tasks with 80% accuracy without the need of repetition of task instructions or significant delays in responding.   Status Partially Met   SLP LONG TERM GOAL #3   Title Patient will demonstrate reading comprehension for sentences with 80% accuracy.   Status Partially Met   SLP LONG TERM GOAL #4   Title Patient will demonstrate reading comprehension for paragraphs with 80% accuracy.   Status On-going   SLP LONG TERM GOAL #5   Title Patient will generate grammatical and cogent sentence to complete abstract/complex linguistic task with 80% accuracy.   Status Partially Met  Plan - 10/01/15 1326    Clinical Impression Statement Today's session evaluated progress of comprehension through administration of parts of the WAB. Patient displayed great improvement on the dimensions of auditory verbal comprehension (100% vs 45% at initial eval), sequential commands (61% vs 26% at initial eval), and repetition (80% vs 44% at initial eval). Patient continues to respond well to multimodal communication to enhance comprehension, especially with the written word and gestures. He plans to explore "talkpath news" over the next  few days in order to improve comprehension of current events. Continue to target auditory rehabilitation at the two and three unit processing level. Patient is very motivated and shows great promise for positive outcomes in speech-language therapy.   Speech Therapy Frequency 3x / week   Duration Other (comment)  8 weeks   Treatment/Interventions Language facilitation;SLP instruction and feedback;Compensatory strategies;Patient/family education;Other (comment)   Potential to Achieve Goals Good   Potential Considerations Ability to learn/carryover information;Severity of impairments;Previous level of function;Cooperation/participation level   SLP Home Exercise Plan explore "talkpath news"   Consulted and Agree with Plan of Care Patient;Family member/caregiver   Family Member Consulted spouse        Problem List Patient Active Problem List   Diagnosis Date Noted  . CVA (cerebral infarction) 08/21/2015  . TIA (transient ischemic attack) 08/20/2015  . Acute encephalopathy 08/20/2015    Wynelle Cleveland 10/01/2015, 1:28 PM  [place] MAIN [facility] SERVICES [address] [place], [place], [address] Phone: [phone]   Fax:  [phone]   Name: Dalton Obrien MRN: [id number] Date of Birth: [birth year]

## 2015-10-03 ENCOUNTER — Ambulatory Visit: Payer: Medicare HMO | Admitting: Speech Pathology

## 2015-10-03 DIAGNOSIS — R4701 Aphasia: Secondary | ICD-10-CM

## 2015-10-04 ENCOUNTER — Encounter: Payer: Self-pay | Admitting: Speech Pathology

## 2015-10-04 ENCOUNTER — Ambulatory Visit: Payer: Medicare HMO | Admitting: Speech Pathology

## 2015-10-04 DIAGNOSIS — R4701 Aphasia: Secondary | ICD-10-CM

## 2015-10-04 NOTE — Therapy (Signed)
[place] MAIN [facility] SERVICES [address] [place], [place], [address] Phone: [phone]   Fax:  [phone]  Speech Language Pathology Treatment  Patient Details  Name: Dalton Obrien MRN: [id number] Date of Birth: 10/12/1940 Referring Provider: Gurney Maxin MD  Encounter Date: 10/03/2015      End of Session - 10/04/15 1107    Visit Number 11   Number of Visits 25   Date for SLP Re-Evaluation 11/29/15   SLP Start Time 1000   SLP Stop Time  1100   SLP Time Calculation (min) 60 min   Activity Tolerance Patient tolerated treatment well      Past Medical History  Diagnosis Date  . Asthma   . 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 No pertinent past surgical history.  There were no vitals filed for this visit.  Visit Diagnosis: Aphasia      Subjective Assessment - 10/04/15 1106    Subjective Patient returns with reports that "talkpath news" has been very helpful in improving his verbal comprehension at home.   Patient is accompained by: Family member   Currently in Pain? No/denies               ADULT SLP TREATMENT - 10/04/15 0001    General Information   Behavior/Cognition Alert;Cooperative;Pleasant mood   Treatment Provided   Treatment provided Cognitive-Linquistic   Pain Assessment   Pain Assessment No/denies pain   Cognitive-Linquistic Treatment   Treatment focused on Aphasia   Skilled Treatment Patient was 80% accurate for comprehension of verbal analogies without written cues; improved to 95% with written cues. Patient was 70% accurate for solving verbal analogy word problems without written cues; improved to 90% with written cues. Patient was 85% accurate for executing three-unit psychomotor commands.    Assessment / Recommendations / Plan   Plan Continue with current plan of care   Progression Toward Goals   Progression toward goals Progressing toward goals          SLP Education  - 10/04/15 1106    Education provided Yes   Education Details strategies to improve comprehension   Person(s) Educated Patient;Spouse   Methods Explanation;Demonstration   Comprehension Verbalized understanding            SLP Long Term Goals - 10/01/15 1327    SLP LONG TERM GOAL #1   Title Patient will complete 2 unit processing tasks with 80% accuracy without the need of repetition of task instructions or significant delays in responding.   Status Partially Met   SLP LONG TERM GOAL #2   Title Patient will complete 3 unit processing tasks with 80% accuracy without the need of repetition of task instructions or significant delays in responding.   Status Partially Met   SLP LONG TERM GOAL #3   Title Patient will demonstrate reading comprehension for sentences with 80% accuracy.   Status Partially Met   SLP LONG TERM GOAL #4   Title Patient will demonstrate reading comprehension for paragraphs with 80% accuracy.   Status On-going   SLP LONG TERM GOAL #5   Title Patient will generate grammatical and cogent sentence to complete abstract/complex linguistic task with 80% accuracy.   Status Partially Met          Plan - 10/04/15 1107    Clinical Impression Statement Today's session continued to target verbal comprehension through verbal analogies and three-unit psychomotor commands. Patient continues to display progress  in this area of receptive language through his performance on complex language tasks without the use of written cues. Patient noted that "talkpath news" has been a very helpful resource to improve comprehension of current events at home. Continue to target auditory rehabilitation at the three unit processing level. Patient is very motivated and shows great promise for positive outcomes in speech-language therapy.   Speech Therapy Frequency 3x / week   Duration Other (comment)  8 weeks   Treatment/Interventions Language facilitation;SLP instruction and  feedback;Compensatory strategies;Patient/family education;Other (comment)   Potential to Achieve Goals Good   Potential Considerations Ability to learn/carryover information;Severity of impairments;Previous level of function;Cooperation/participation level   SLP Home Exercise Plan explore "talkpath news"   Consulted and Agree with Plan of Care Patient;Family member/caregiver   Family Member Consulted spouse        Problem List Patient Active Problem List   Diagnosis Date Noted  . CVA (cerebral infarction) 08/21/2015  . TIA (transient ischemic attack) 08/20/2015  . Acute encephalopathy 08/20/2015    Wynelle Cleveland 10/04/2015, 11:13 AM  [place] MAIN [facility] SERVICES [address] [place], [place], [address] Phone: [phone]   Fax:  [phone]   Name: ASSER LUCENA MRN: [id number] Date of Birth: 02-Oct-1940

## 2015-10-04 NOTE — Therapy (Signed)
[place] MAIN [facility] SERVICES [address] [place], [place], [address] Phone: [phone]   Fax:  [phone]  Speech Language Pathology Treatment  Patient Details  Name: Dalton Obrien MRN: [id number] Date of Birth: 10/23/1940 Referring Provider: Gurney Maxin MD  Encounter Date: 10/04/2015      End of Session - 10/04/15 1327    Visit Number 12   Number of Visits 25   Date for SLP Re-Evaluation 11/29/15   SLP Start Time 1000   SLP Stop Time  1100   SLP Time Calculation (min) 60 min   Activity Tolerance Patient tolerated treatment well      Past Medical History  Diagnosis Date  . Asthma   . 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 No pertinent past surgical history.  There were no vitals filed for this visit.  Visit Diagnosis: Aphasia      Subjective Assessment - 10/04/15 1326    Subjective Patient returns with reports that "talkpath news" has been very helpful in improving his verbal comprehension at home.   Currently in Pain? No/denies               ADULT SLP TREATMENT - 10/04/15 1325    General Information   Behavior/Cognition Alert;Cooperative;Pleasant mood   Treatment Provided   Treatment provided Cognitive-Linquistic   Pain Assessment   Pain Assessment No/denies pain   Cognitive-Linquistic Treatment   Treatment focused on Aphasia   Skilled Treatment Patient was 75% accurate for answering questions related to short news stories on "talkpath news" without written cues; improved to 100% with written cues. Patient was 100% accurate for solving complex word problems that focused on sequencing with minimum support from clinician.    Assessment / Recommendations / Plan   Plan Continue with current plan of care   Progression Toward Goals   Progression toward goals Progressing toward goals          SLP Education - 10/04/15 1327    Education provided Yes   Education Details strategies to  improve communication   Person(s) Educated Patient;Spouse   Methods Explanation;Demonstration   Comprehension Verbalized understanding;Returned demonstration            SLP Long Term Goals - 10/01/15 1327    SLP LONG TERM GOAL #1   Title Patient will complete 2 unit processing tasks with 80% accuracy without the need of repetition of task instructions or significant delays in responding.   Status Partially Met   SLP LONG TERM GOAL #2   Title Patient will complete 3 unit processing tasks with 80% accuracy without the need of repetition of task instructions or significant delays in responding.   Status Partially Met   SLP LONG TERM GOAL #3   Title Patient will demonstrate reading comprehension for sentences with 80% accuracy.   Status Partially Met   SLP LONG TERM GOAL #4   Title Patient will demonstrate reading comprehension for paragraphs with 80% accuracy.   Status On-going   SLP LONG TERM GOAL #5   Title Patient will generate grammatical and cogent sentence to complete abstract/complex linguistic task with 80% accuracy.   Status Partially Met          Plan - 10/04/15 1328    Clinical Impression Statement Today's session continued to target verbal comprehension through listening to short news stories on "talkpath news" and answering questions related to the content presented. Patient continues to display progress in  this area of receptive language through his performance on complex language tasks without the use of written cues. Patient and Clinician explored "talkpath news" today and discussed strategies to use the site to improve receptive communication skills. Continue to target auditory rehabilitation at the three unit processing level. Patient is very motivated and shows great promise for positive outcomes in speech-language therapy.   Speech Therapy Frequency 3x / week   Duration Other (comment)  8 weeks   Treatment/Interventions Language facilitation;SLP instruction and  feedback;Compensatory strategies;Patient/family education;Other (comment)   Potential to Achieve Goals Good   Potential Considerations Ability to learn/carryover information;Severity of impairments;Previous level of function;Cooperation/participation level   SLP Home Exercise Plan explore "talkpath news"   Consulted and Agree with Plan of Care Patient;Family member/caregiver   Family Member Consulted spouse        Problem List Patient Active Problem List   Diagnosis Date Noted  . CVA (cerebral infarction) 08/21/2015  . TIA (transient ischemic attack) 08/20/2015  . Acute encephalopathy 08/20/2015    Wynelle Cleveland 10/04/2015, 1:28 PM  [place] MAIN [facility] SERVICES [address] [place], [place], [address] Phone: [phone]   Fax:  [phone]   Name: Dalton Obrien MRN: [id number] Date of Birth: Dec 29, 1940

## 2015-10-09 ENCOUNTER — Encounter: Payer: Medicare HMO | Admitting: Speech Pathology

## 2015-10-11 ENCOUNTER — Encounter: Payer: Medicare HMO | Admitting: Speech Pathology

## 2015-10-15 ENCOUNTER — Encounter: Payer: Self-pay | Admitting: Speech Pathology

## 2015-10-15 ENCOUNTER — Ambulatory Visit: Payer: Medicare HMO | Attending: Neurology | Admitting: Speech Pathology

## 2015-10-15 DIAGNOSIS — R4701 Aphasia: Secondary | ICD-10-CM | POA: Insufficient documentation

## 2015-10-15 NOTE — Therapy (Signed)
[place] MAIN [facility] SERVICES [address] [place], [place], [address] Phone: [phone]   Fax:  [phone]  Speech Language Pathology Treatment  Patient Details  Name: Dalton Obrien MRN: [id number] Date of Birth: 14-Jul-1940 Referring Provider: Gurney Maxin MD  Encounter Date: 10/15/2015      End of Session - 10/15/15 1422    Visit Number 13   Number of Visits 25   Date for SLP Re-Evaluation 11/29/15   SLP Start Time 1100   SLP Stop Time  1200   SLP Time Calculation (min) 60 min   Activity Tolerance Patient tolerated treatment well      Past Medical History  Diagnosis Date  . Asthma   . 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 No pertinent past surgical history.  There were no vitals filed for this visit.      Subjective Assessment - 10/15/15 1421    Subjective Patient returns with reports that his comprehension continues to improve.   Patient is accompained by: Family member   Currently in Pain? No/denies               ADULT SLP TREATMENT - 10/15/15 0001    General Information   Behavior/Cognition Alert;Cooperative;Pleasant mood   Treatment Provided   Treatment provided Cognitive-Linquistic   Pain Assessment   Pain Assessment No/denies pain   Cognitive-Linquistic Treatment   Treatment focused on Aphasia   Skilled Treatment Patient was 65% accurate for answering multi-unit yes-no questions that focused on making comparisons; improved to 80% with self-generated written cues. Patient was 100% accurate for solving complex word problems that focused on sequencing with minimum support from clinician.    Assessment / Recommendations / Plan   Plan Continue with current plan of care   Progression Toward Goals   Progression toward goals Progressing toward goals          SLP Education - 10/15/15 1421    Education provided Yes   Education Details strategies to improve communication    Person(s) Educated Patient;Spouse   Methods Explanation;Demonstration   Comprehension Verbalized understanding;Returned demonstration            SLP Long Term Goals - 10/01/15 1327    SLP LONG TERM GOAL #1   Title Patient will complete 2 unit processing tasks with 80% accuracy without the need of repetition of task instructions or significant delays in responding.   Status Partially Met   SLP LONG TERM GOAL #2   Title Patient will complete 3 unit processing tasks with 80% accuracy without the need of repetition of task instructions or significant delays in responding.   Status Partially Met   SLP LONG TERM GOAL #3   Title Patient will demonstrate reading comprehension for sentences with 80% accuracy.   Status Partially Met   SLP LONG TERM GOAL #4   Title Patient will demonstrate reading comprehension for paragraphs with 80% accuracy.   Status On-going   SLP LONG TERM GOAL #5   Title Patient will generate grammatical and cogent sentence to complete abstract/complex linguistic task with 80% accuracy.   Status Partially Met          Plan - 10/15/15 1422    Clinical Impression Statement Patient returns with reports that he continues to improve with his verbal comprehension, but occasionally still requires written cues and repetition to aid his understanding. Today's session continued to target verbal comprehension through confronting the Patient with  multi-unit yes-no questions that focused on making comparisons. Patient was encouraged today to adopt a strategy that incorporates writing key words in order to improve comprehension; acquiring a Boogie Board Tablet was recommended to aid in the facilitation of this strategy. Patient displayed improvement with execution of this strategy. Patient will benefit from counseling focused on acceptance of his receptive language deficits and taking ownership of communication breakdowns that occur. Patient is very motivated and shows great promise for  positive outcomes in speech-language therapy.   Speech Therapy Frequency 3x / week   Duration Other (comment)  8 weeks   Treatment/Interventions Language facilitation;SLP instruction and feedback;Compensatory strategies;Patient/family education;Other (comment)   Potential to Achieve Goals Good   Potential Considerations Ability to learn/carryover information;Severity of impairments;Previous level of function;Cooperation/participation level   SLP Home Exercise Plan explore "talkpath news"   Consulted and Agree with Plan of Care Patient;Family member/caregiver   Family Member Consulted spouse      Patient will benefit from skilled therapeutic intervention in order to improve the following deficits and impairments:   Aphasia    Problem List Patient Active Problem List   Diagnosis Date Noted  . CVA (cerebral infarction) 08/21/2015  . TIA (transient ischemic attack) 08/20/2015  . Acute encephalopathy 08/20/2015    Wynelle Cleveland 10/15/2015, 2:23 PM  [place] MAIN [facility] SERVICES [address] [place], [place], [address] Phone: [phone]   Fax:  [phone]   Name: Dalton Obrien MRN: [id number] Date of Birth: 1940/09/10

## 2015-10-18 ENCOUNTER — Ambulatory Visit: Payer: Medicare HMO | Admitting: Speech Pathology

## 2015-10-18 ENCOUNTER — Encounter: Payer: Self-pay | Admitting: Speech Pathology

## 2015-10-18 DIAGNOSIS — R4701 Aphasia: Secondary | ICD-10-CM | POA: Diagnosis not present

## 2015-10-18 NOTE — Therapy (Signed)
[place] MAIN [facility] SERVICES [address] [place], [place], [address] Phone: [phone]   Fax:  [phone]  Speech Language Pathology Treatment  Patient Details  Name: Dalton Obrien MRN: [id number] Date of Birth: 02-Nov-1940 Referring Provider: Gurney Maxin MD  Encounter Date: 10/18/2015      End of Session - 10/18/15 1301    Visit Number 14   Number of Visits 25   Date for SLP Re-Evaluation 11/29/15   SLP Start Time 1100   SLP Stop Time  1200   SLP Time Calculation (min) 60 min   Activity Tolerance Patient tolerated treatment well      Past Medical History  Diagnosis Date  . Asthma   . 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 No pertinent past surgical history.  There were no vitals filed for this visit.      Subjective Assessment - 10/18/15 1259    Subjective Patient feels that he is listening better, but continues to have difficulty.   Patient is accompained by: Family member   Currently in Pain? No/denies               ADULT SLP TREATMENT - 10/18/15 0001    General Information   Behavior/Cognition Alert;Cooperative;Pleasant mood   Treatment Provided   Treatment provided Cognitive-Linquistic   Pain Assessment   Pain Assessment No/denies pain   Cognitive-Linquistic Treatment   Treatment focused on Aphasia   Skilled Treatment AUDITORY COMPREHENSION: Answer multi-unit yes/no questions with 60% accuracy; increases to 80% given one repetition of stimulus.  READING COMPREHENSION:  Read/comprehend written verbal analogies with 80% accuracy independently.  Errors include inflexible comprehension of words with multiple meaning and difficulty with syntactically complex sentences.      Assessment / Recommendations / Plan   Plan Continue with current plan of care   Progression Toward Goals   Progression toward goals Progressing toward goals          SLP Education - 10/18/15 1259    Education  provided Yes   Education Details Identify error of comprehension when he can see he and communication partner are not in agreement   Person(s) Educated Patient;Spouse   Methods Explanation;Demonstration   Comprehension Verbalized understanding;Returned demonstration            SLP Long Term Goals - 10/01/15 1327    SLP LONG TERM GOAL #1   Title Patient will complete 2 unit processing tasks with 80% accuracy without the need of repetition of task instructions or significant delays in responding.   Status Partially Met   SLP LONG TERM GOAL #2   Title Patient will complete 3 unit processing tasks with 80% accuracy without the need of repetition of task instructions or significant delays in responding.   Status Partially Met   SLP LONG TERM GOAL #3   Title Patient will demonstrate reading comprehension for sentences with 80% accuracy.   Status Partially Met   SLP LONG TERM GOAL #4   Title Patient will demonstrate reading comprehension for paragraphs with 80% accuracy.   Status On-going   SLP LONG TERM GOAL #5   Title Patient will generate grammatical and cogent sentence to complete abstract/complex linguistic task with 80% accuracy.   Status Partially Met          Plan - 10/18/15 1301    Clinical Impression Statement The patient is improving in oral and written comprehension.  He is completing reading and  listening exercises at home.   Speech Therapy Frequency 2x / week   Treatment/Interventions Language facilitation;SLP instruction and feedback;Compensatory strategies;Patient/family education;Other (comment)   Potential to Achieve Goals Good   Potential Considerations Ability to learn/carryover information;Severity of impairments;Previous level of function;Cooperation/participation level   SLP Home Exercise Plan explore "talkpath news"   Consulted and Agree with Plan of Care Patient;Family member/caregiver   Family Member Consulted spouse      Patient will benefit from  skilled therapeutic intervention in order to improve the following deficits and impairments:   Aphasia    Problem List Patient Active Problem List   Diagnosis Date Noted  . CVA (cerebral infarction) 08/21/2015  . TIA (transient ischemic attack) 08/20/2015  . Acute encephalopathy 08/20/2015   Leroy Sea, MS/CCC- SLP  Lou Miner 10/18/2015, 1:02 PM  [place] MAIN [facility] SERVICES [address] [place], [place], [address] Phone: [phone]   Fax:  [phone]   Name: Dalton Obrien MRN: [id number] Date of Birth: 08-Jul-1940

## 2015-10-21 ENCOUNTER — Ambulatory Visit: Payer: Medicare HMO | Admitting: Speech Pathology

## 2015-10-21 DIAGNOSIS — R4701 Aphasia: Secondary | ICD-10-CM

## 2015-10-22 ENCOUNTER — Encounter: Payer: Self-pay | Admitting: Speech Pathology

## 2015-10-22 NOTE — Therapy (Signed)
[place] MAIN [facility] SERVICES [address] [place], [place], [address] Phone: [phone]   Fax:  [phone]  Speech Language Pathology Treatment  Patient Details  Name: Dalton Obrien MRN: [id number] Date of Birth: 1941-04-14 Referring Provider: Gurney Maxin MD  Encounter Date: 10/21/2015      End of Session - 10/22/15 1305    Visit Number 15   Number of Visits 25   Date for SLP Re-Evaluation 11/29/15   SLP Start Time 1000   SLP Stop Time  1100   SLP Time Calculation (min) 60 min   Activity Tolerance Patient tolerated treatment well      Past Medical History  Diagnosis Date  . Asthma   . 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 No pertinent past surgical history.  There were no vitals filed for this visit.      Subjective Assessment - 10/22/15 1304    Subjective Patient returns with reports that his comprehension continues to improve.   Currently in Pain? No/denies               ADULT SLP TREATMENT - 10/22/15 0001    General Information   Behavior/Cognition Alert;Cooperative;Pleasant mood   Treatment Provided   Treatment provided Cognitive-Linquistic   Pain Assessment   Pain Assessment No/denies pain   Cognitive-Linquistic Treatment   Treatment focused on Aphasia   Skilled Treatment Patient was 65% accurate for answering questions related to a short paragraph read aloud by the clinician without written cues; improved to 75% with written cues. Patient was 75% accurate for answering multi-unit yes-no questions; improved to 85% with self-generated written cues. Patient was 100% accurate for solving complex word problems that focused on sequencing with minimum support from clinician.    Assessment / Recommendations / Plan   Plan Continue with current plan of care   Progression Toward Goals   Progression toward goals Progressing toward goals          SLP Education - 10/22/15 1304     Education provided Yes   Education Details strategies to improve verbal comprehension   Person(s) Educated Patient;Spouse   Methods Explanation;Demonstration   Comprehension Verbalized understanding;Returned demonstration            SLP Long Term Goals - 10/01/15 1327    SLP LONG TERM GOAL #1   Title Patient will complete 2 unit processing tasks with 80% accuracy without the need of repetition of task instructions or significant delays in responding.   Status Partially Met   SLP LONG TERM GOAL #2   Title Patient will complete 3 unit processing tasks with 80% accuracy without the need of repetition of task instructions or significant delays in responding.   Status Partially Met   SLP LONG TERM GOAL #3   Title Patient will demonstrate reading comprehension for sentences with 80% accuracy.   Status Partially Met   SLP LONG TERM GOAL #4   Title Patient will demonstrate reading comprehension for paragraphs with 80% accuracy.   Status On-going   SLP LONG TERM GOAL #5   Title Patient will generate grammatical and cogent sentence to complete abstract/complex linguistic task with 80% accuracy.   Status Partially Met          Plan - 10/22/15 1305    Clinical Impression Statement Patient returns with reports that he continues to improve with his verbal comprehension, but occasionally still requires written cues and repetition to aid  his understanding. Today's session continued to target verbal comprehension through confronting the Patient with multi-unit yes-no questions and short paragraphs with follow up questions. We continued to work on a strategy that incorporates writing key words in order to improve comprehension; Mr. Maker acquired a Boogie Board Tablet which he has been using to aid his comprehension. Patient displayed improvement with execution of this strategy. Patient will benefit from counseling focused on acceptance of his receptive language deficits and taking ownership of  communication breakdowns that occur.    Speech Therapy Frequency 2x / week   Duration Other (comment)  8 weeks   Treatment/Interventions Language facilitation;SLP instruction and feedback;Compensatory strategies;Patient/family education;Other (comment)   Potential to Achieve Goals Good   Potential Considerations Ability to learn/carryover information;Severity of impairments;Previous level of function;Cooperation/participation level   SLP Home Exercise Plan explore "talkpath news"   Consulted and Agree with Plan of Care Patient;Family member/caregiver   Family Member Consulted spouse      Patient will benefit from skilled therapeutic intervention in order to improve the following deficits and impairments:   Aphasia    Problem List Patient Active Problem List   Diagnosis Date Noted  . CVA (cerebral infarction) 08/21/2015  . TIA (transient ischemic attack) 08/20/2015  . Acute encephalopathy 08/20/2015    Wynelle Cleveland 10/22/2015, 1:06 PM  [place] MAIN [facility] SERVICES [address] [place], [place], [address] Phone: [phone]   Fax:  [phone]   Name: Dalton Obrien MRN: [id number] Date of Birth: Feb 11, 1941

## 2015-10-23 ENCOUNTER — Encounter: Payer: Self-pay | Admitting: Speech Pathology

## 2015-10-23 ENCOUNTER — Ambulatory Visit: Payer: Medicare HMO | Admitting: Speech Pathology

## 2015-10-23 DIAGNOSIS — R4701 Aphasia: Secondary | ICD-10-CM | POA: Diagnosis not present

## 2015-10-23 NOTE — Therapy (Signed)
[place] MAIN [facility] SERVICES [address] [place], [place], [address] Phone: [phone]   Fax:  [phone]  Speech Language Pathology Treatment  Patient Details  Name: Dalton Obrien MRN: [id number] Date of Birth: 09-14-1940 Referring Provider: Gurney Maxin MD  Encounter Date: 10/23/2015      End of Session - 10/23/15 1331    Visit Number 16   Number of Visits 25   Date for SLP Re-Evaluation 11/29/15   SLP Start Time 1000   SLP Stop Time  1100   SLP Time Calculation (min) 60 min   Activity Tolerance Patient tolerated treatment well      Past Medical History  Diagnosis Date  . Asthma   . 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 No pertinent past surgical history.  There were no vitals filed for this visit.      Subjective Assessment - 10/23/15 1330    Subjective Patient returns with reports that his comprehension continues to improve.   Patient is accompained by: Family member   Currently in Pain? No/denies               ADULT SLP TREATMENT - 10/23/15 0001    General Information   Behavior/Cognition Alert;Cooperative;Pleasant mood   Treatment Provided   Treatment provided Cognitive-Linquistic   Pain Assessment   Pain Assessment No/denies pain   Cognitive-Linquistic Treatment   Treatment focused on Aphasia   Skilled Treatment Patient was 70% accurate for answering questions related to a short paragraph read aloud by the clinician without written cues; improved to 80% with written cues. Patient was 90% accurate for solving verbal analogy problems with minimum support from clinician.    Assessment / Recommendations / Plan   Plan Continue with current plan of care   Progression Toward Goals   Progression toward goals Progressing toward goals          SLP Education - 10/23/15 1331    Education provided Yes   Education Details strategies to improve verbal comprehension   Person(s)  Educated Patient;Spouse   Methods Explanation;Demonstration   Comprehension Verbalized understanding;Returned demonstration            SLP Long Term Goals - 10/01/15 1327    SLP LONG TERM GOAL #1   Title Patient will complete 2 unit processing tasks with 80% accuracy without the need of repetition of task instructions or significant delays in responding.   Status Partially Met   SLP LONG TERM GOAL #2   Title Patient will complete 3 unit processing tasks with 80% accuracy without the need of repetition of task instructions or significant delays in responding.   Status Partially Met   SLP LONG TERM GOAL #3   Title Patient will demonstrate reading comprehension for sentences with 80% accuracy.   Status Partially Met   SLP LONG TERM GOAL #4   Title Patient will demonstrate reading comprehension for paragraphs with 80% accuracy.   Status On-going   SLP LONG TERM GOAL #5   Title Patient will generate grammatical and cogent sentence to complete abstract/complex linguistic task with 80% accuracy.   Status Partially Met          Plan - 10/23/15 1331    Clinical Impression Statement Patient returns with reports that he continues to improve with his verbal comprehension; he attended a lecture yesterday and felt that he understood 80% of the talk. Today's session continued to target verbal comprehension through  confronting the Patient with short paragraphs with follow up questions. We continued to work on a strategy that incorporates writing key words in order to improve comprehension; Patient displayed improvement with execution of this strategy.    Speech Therapy Frequency 2x / week   Duration 1 week  8 weeks   Treatment/Interventions Language facilitation;SLP instruction and feedback;Compensatory strategies;Patient/family education;Other (comment)   Potential to Achieve Goals Good   Potential Considerations Ability to learn/carryover information;Severity of impairments;Previous level of  function;Cooperation/participation level   SLP Home Exercise Plan explore "talkpath news"   Consulted and Agree with Plan of Care Patient;Family member/caregiver   Family Member Consulted spouse      Patient will benefit from skilled therapeutic intervention in order to improve the following deficits and impairments:   Aphasia    Problem List Patient Active Problem List   Diagnosis Date Noted  . CVA (cerebral infarction) 08/21/2015  . TIA (transient ischemic attack) 08/20/2015  . Acute encephalopathy 08/20/2015    Wynelle Cleveland 10/23/2015, 1:32 PM  [place] MAIN [facility] SERVICES [address] [place], [place], [address] Phone: [phone]   Fax:  [phone]   Name: Dalton Obrien MRN: [id number] Date of Birth: 1941-01-02

## 2015-10-29 ENCOUNTER — Encounter: Payer: Self-pay | Admitting: Speech Pathology

## 2015-10-29 ENCOUNTER — Ambulatory Visit: Payer: Medicare HMO | Admitting: Speech Pathology

## 2015-10-29 DIAGNOSIS — R4701 Aphasia: Secondary | ICD-10-CM | POA: Diagnosis not present

## 2015-10-29 NOTE — Therapy (Signed)
[place] MAIN [facility] SERVICES [address] [place], [place], [address] Phone: [phone]   Fax:  [phone]  Speech Language Pathology Treatment  Patient Details  Name: TRACE WIRICK MRN: [id number] Date of Birth: 1940/10/16 Referring Provider: Gurney Maxin MD  Encounter Date: 10/29/2015      End of Session - 10/29/15 1518    Visit Number 17   Number of Visits 25   Date for SLP Re-Evaluation 11/29/15   SLP Start Time 1000   SLP Stop Time  1100   SLP Time Calculation (min) 60 min   Activity Tolerance Patient tolerated treatment well      Past Medical History  Diagnosis Date  . Asthma   . 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 No pertinent past surgical history.  There were no vitals filed for this visit.      Subjective Assessment - 10/29/15 1518    Subjective Patient returns with reports that his comprehension continues to improve.   Patient is accompained by: Family member   Currently in Pain? No/denies               ADULT SLP TREATMENT - 10/29/15 0001    General Information   Behavior/Cognition Alert;Cooperative;Pleasant mood   Treatment Provided   Treatment provided Cognitive-Linquistic   Pain Assessment   Pain Assessment No/denies pain   Cognitive-Linquistic Treatment   Treatment focused on Aphasia   Skilled Treatment Patient was 70% accurate for answering questions related to a short paragraph read aloud by the clinician without written cues; improved to 85% with written cues. Patient was 90% accurate for answering questions related to a sentence read aloud by the clinician without written cues; improved to 95% with one repetition of stimulus. Patient was 80% accurate for answering open-ended clinician questions; improved to 90% with one repetition of stimulus.   Assessment / Recommendations / Plan   Plan Continue with current plan of care   Progression Toward Goals   Progression  toward goals Progressing toward goals          SLP Education - 10/29/15 1518    Education provided Yes   Education Details strategies to improve verbal comprehension   Person(s) Educated Patient;Spouse   Methods Explanation;Demonstration   Comprehension Verbalized understanding;Returned demonstration            SLP Long Term Goals - 10/01/15 1327    SLP LONG TERM GOAL #1   Title Patient will complete 2 unit processing tasks with 80% accuracy without the need of repetition of task instructions or significant delays in responding.   Status Partially Met   SLP LONG TERM GOAL #2   Title Patient will complete 3 unit processing tasks with 80% accuracy without the need of repetition of task instructions or significant delays in responding.   Status Partially Met   SLP LONG TERM GOAL #3   Title Patient will demonstrate reading comprehension for sentences with 80% accuracy.   Status Partially Met   SLP LONG TERM GOAL #4   Title Patient will demonstrate reading comprehension for paragraphs with 80% accuracy.   Status On-going   SLP LONG TERM GOAL #5   Title Patient will generate grammatical and cogent sentence to complete abstract/complex linguistic task with 80% accuracy.   Status Partially Met          Plan - 10/29/15 1519    Clinical Impression Statement Patient returns with reports that he  continues to improve with his verbal comprehension. Today's session continued to target verbal comprehension through confronting the Patient with open-ended questions, as well as sentences and short paragraphs with follow up questions. We continued to work on a strategy that incorporates writing key words in order to improve comprehension; Patient displayed improvement with execution of this strategy.    Speech Therapy Frequency 2x / week   Duration Other (comment)  12 weeks   Treatment/Interventions Language facilitation;SLP instruction and feedback;Compensatory strategies;Patient/family  education;Other (comment)   Potential to Achieve Goals Good   Potential Considerations Ability to learn/carryover information;Severity of impairments;Previous level of function;Cooperation/participation level   SLP Home Exercise Plan explore "talkpath news"   Consulted and Agree with Plan of Care Patient;Family member/caregiver   Family Member Consulted spouse      Patient will benefit from skilled therapeutic intervention in order to improve the following deficits and impairments:   Aphasia    Problem List Patient Active Problem List   Diagnosis Date Noted  . CVA (cerebral infarction) 08/21/2015  . TIA (transient ischemic attack) 08/20/2015  . Acute encephalopathy 08/20/2015    Wynelle Cleveland 10/29/2015, 3:20 PM  [place] MAIN [facility] SERVICES [address] [place], [place], [address] Phone: [phone]   Fax:  [phone]   Name: SEIBERT KEETER MRN: [id number] Date of Birth: 07/25/1940

## 2015-10-31 ENCOUNTER — Ambulatory Visit: Payer: Medicare HMO | Admitting: Speech Pathology

## 2015-10-31 DIAGNOSIS — R4701 Aphasia: Secondary | ICD-10-CM

## 2015-11-01 ENCOUNTER — Encounter: Payer: Self-pay | Admitting: Speech Pathology

## 2015-11-01 NOTE — Therapy (Signed)
[place] MAIN [facility] SERVICES [address] [place], [place], [address] Phone: [phone]   Fax:  [phone]  Speech Language Pathology Treatment  Patient Details  Name: Dalton Obrien MRN: [id number] Date of Birth: [birth year] Referring Provider: Gurney Maxin MD  Encounter Date: 10/31/2015      End of Session - 11/01/15 0813    Visit Number 18   Number of Visits 25   Date for SLP Re-Evaluation 11/29/15   SLP Start Time 1100   SLP Stop Time  0800   SLP Time Calculation (min) 1260 min   Activity Tolerance Patient tolerated treatment well      Past Medical History  Diagnosis Date  . Asthma   . 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 No pertinent past surgical history.  There were no vitals filed for this visit.      Subjective Assessment - 11/01/15 0813    Subjective Patient returns with reports that his comprehension continues to improve.   Patient is accompained by: Family member   Currently in Pain? No/denies               ADULT SLP TREATMENT - 11/01/15 0001    General Information   Behavior/Cognition Alert;Cooperative;Pleasant mood   Treatment Provided   Treatment provided Cognitive-Linquistic   Pain Assessment   Pain Assessment No/denies pain   Cognitive-Linquistic Treatment   Treatment focused on Aphasia   Skilled Treatment AUDITORY COMPREHENSION: Comprehend miscellaneous questions presented auditorily with 60% accuracy; increases to 100% given use of strategy (write pertinent information) and repetition of stimulus.  READING COMPREHENSION:  Read/comprehend written logical solutions task (schedule planning) given mod cues locate information, process significance of information, and use strategies to recall and utilize information.   VERBAL EXPRESSION:  Generate cogent and grammatical sentences to answer miscellaneous questions with 50% accuracy.  Errors primarily syntactic.   Assessment /  Recommendations / Plan   Plan Continue with current plan of care   Progression Toward Goals   Progression toward goals Progressing toward goals          SLP Education - 11/01/15 0813    Education provided Yes   Education Details strategies to improve verbal comprehension   Person(s) Educated Patient;Spouse   Methods Explanation   Comprehension Verbalized understanding            SLP Long Term Goals - 10/01/15 1327    SLP LONG TERM GOAL #1   Title Patient will complete 2 unit processing tasks with 80% accuracy without the need of repetition of task instructions or significant delays in responding.   Status Partially Met   SLP LONG TERM GOAL #2   Title Patient will complete 3 unit processing tasks with 80% accuracy without the need of repetition of task instructions or significant delays in responding.   Status Partially Met   SLP LONG TERM GOAL #3   Title Patient will demonstrate reading comprehension for sentences with 80% accuracy.   Status Partially Met   SLP LONG TERM GOAL #4   Title Patient will demonstrate reading comprehension for paragraphs with 80% accuracy.   Status On-going   SLP LONG TERM GOAL #5   Title Patient will generate grammatical and cogent sentence to complete abstract/complex linguistic task with 80% accuracy.   Status Partially Met          Plan - 11/01/15 0814    Clinical Impression Statement The patient is improving  in oral and written comprehension.  He is completing reading and listening exercises at home.  Patient demonstrates significant improvement in level of complexity of written information that he can comprehend.  The patient is demonstrating errors in verbal output and this will be address in future sessions.   Speech Therapy Frequency 2x / week   Duration Other (comment)   Treatment/Interventions Language facilitation;SLP instruction and feedback;Compensatory strategies;Patient/family education;Other (comment)   Potential to Achieve  Goals Good   Potential Considerations Ability to learn/carryover information;Severity of impairments;Previous level of function;Cooperation/participation level   SLP Home Exercise Plan explore "talkpath news"   Consulted and Agree with Plan of Care Patient;Family member/caregiver   Family Member Consulted spouse      Patient will benefit from skilled therapeutic intervention in order to improve the following deficits and impairments:   Aphasia    Problem List Patient Active Problem List   Diagnosis Date Noted  . CVA (cerebral infarction) 08/21/2015  . TIA (transient ischemic attack) 08/20/2015  . Acute encephalopathy 08/20/2015   Leroy Sea, MS/CCC- SLP  Lou Miner 11/01/2015, 8:15 AM  [place] MAIN [facility] SERVICES [address] [place], [place], [address] Phone: [phone]   Fax:  [phone]   Name: Dalton Obrien MRN: [id number] Date of Birth: September 20, 1940

## 2015-11-14 ENCOUNTER — Ambulatory Visit: Payer: Medicare HMO | Attending: Neurology | Admitting: Speech Pathology

## 2015-11-14 DIAGNOSIS — R4701 Aphasia: Secondary | ICD-10-CM | POA: Diagnosis not present

## 2015-11-15 ENCOUNTER — Encounter: Payer: Self-pay | Admitting: Speech Pathology

## 2015-11-15 NOTE — Therapy (Signed)
[place] MAIN [facility] SERVICES [address] [place], [place], [address] Phone: [phone]   Fax:  [phone]  Speech Language Pathology Treatment  Patient Details  Name: Dalton Obrien MRN: [id number] Date of Birth: May 17, 1941 Referring Provider: Gurney Maxin MD  Encounter Date: 11/14/2015      End of Session - 11/15/15 1244    Visit Number 19   Number of Visits 25   Date for SLP Re-Evaluation 11/29/15   SLP Start Time 0904   SLP Stop Time  1000   SLP Time Calculation (min) 56 min   Activity Tolerance Patient tolerated treatment well      Past Medical History  Diagnosis Date  . Asthma   . 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 No pertinent past surgical history.  There were no vitals filed for this visit.      Subjective Assessment - 11/15/15 1241    Subjective Patient returns with reports that his comprehension continues to improve.   Patient is accompained by: Family member   Currently in Pain? No/denies               ADULT SLP TREATMENT - 11/15/15 0001    General Information   Behavior/Cognition Alert;Cooperative;Pleasant mood   Treatment Provided   Treatment provided Cognitive-Linquistic   Pain Assessment   Pain Assessment No/denies pain   Cognitive-Linquistic Treatment   Treatment focused on Aphasia   Skilled Treatment AUDITORY COMPREHENSION: Comprehend miscellaneous questions presented auditorily with 60% accuracy; increases to 100% given use of strategy (write pertinent information) and repetition of stimulus.  READING COMPREHENSION:  Read/comprehend written logical solutions task ("tricky questions") given mod cues locate information, process significance of information, and use strategies to recall and utilize information.   VERBAL EXPRESSION:  State intangible given description with 70% accuracy.   Assessment / Recommendations / Plan   Plan Continue with current plan of care   Progression Toward Goals   Progression toward goals Progressing toward goals          SLP Education - 11/15/15 1242    Education provided Yes   Education Details strategies to improve auditory comprehension and verbal expression   Person(s) Educated Patient;Spouse   Methods Explanation   Comprehension Verbalized understanding            SLP Long Term Goals - 10/01/15 1327    SLP LONG TERM GOAL #1   Title Patient will complete 2 unit processing tasks with 80% accuracy without the need of repetition of task instructions or significant delays in responding.   Status Partially Met   SLP LONG TERM GOAL #2   Title Patient will complete 3 unit processing tasks with 80% accuracy without the need of repetition of task instructions or significant delays in responding.   Status Partially Met   SLP LONG TERM GOAL #3   Title Patient will demonstrate reading comprehension for sentences with 80% accuracy.   Status Partially Met   SLP LONG TERM GOAL #4   Title Patient will demonstrate reading comprehension for paragraphs with 80% accuracy.   Status On-going   SLP LONG TERM GOAL #5   Title Patient will generate grammatical and cogent sentence to complete abstract/complex linguistic task with 80% accuracy.   Status Partially Met          Plan - 11/15/15 1245    Clinical Impression Statement The patient is improving in oral and written comprehension.  He  is completing reading and listening exercises at home.  Patient demonstrates significant improvement in level of complexity of written information that he can comprehend.  The patient is demonstrating errors in verbal output and this will be address in future sessions.   Speech Therapy Frequency 2x / week   Duration Other (comment)   Treatment/Interventions Language facilitation;SLP instruction and feedback;Compensatory strategies;Patient/family education;Other (comment)   Potential to Achieve Goals Good   Potential Considerations Ability  to learn/carryover information;Severity of impairments;Previous level of function;Cooperation/participation level   Consulted and Agree with Plan of Care Patient;Family member/caregiver   Family Member Consulted spouse      Patient will benefit from skilled therapeutic intervention in order to improve the following deficits and impairments:   Aphasia    Problem List Patient Active Problem List   Diagnosis Date Noted  . CVA (cerebral infarction) 08/21/2015  . TIA (transient ischemic attack) 08/20/2015  . Acute encephalopathy 08/20/2015   Leroy Sea, MS/CCC- SLP  Lou Miner 11/15/2015, 12:45 PM  [place] MAIN [facility] SERVICES [address] [name], [place], [address] Phone: [phone]   Fax:  [phone]   Name: Dalton Obrien MRN: [id number] Date of Birth: [birth year]

## 2015-11-19 ENCOUNTER — Ambulatory Visit: Payer: Medicare HMO | Admitting: Speech Pathology

## 2015-11-19 ENCOUNTER — Encounter: Payer: Self-pay | Admitting: Speech Pathology

## 2015-11-19 DIAGNOSIS — R4701 Aphasia: Secondary | ICD-10-CM

## 2015-11-19 NOTE — Therapy (Signed)
[place] MAIN [facility] SERVICES [address] [place], [place], [address] Phone: [phone]   Fax:  [phone]  Speech Language Pathology Treatment/Progress Note  Patient Details  Name: Dalton Obrien MRN: [id number] Date of Birth: Mar 14, 1941 Referring Provider: Gurney Maxin MD  Encounter Date: 11/19/2015      End of Session - 11/19/15 1743    Visit Number 20   Number of Visits 25   Date for SLP Re-Evaluation 11/29/15   SLP Start Time 80   SLP Stop Time  1500   SLP Time Calculation (min) 60 min   Activity Tolerance Patient tolerated treatment well      Past Medical History  Diagnosis Date  . Asthma   . 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 No pertinent past surgical history.  There were no vitals filed for this visit.      Subjective Assessment - 11/19/15 1742    Subjective Patient returns with reports that his comprehension continues to improve.   Patient is accompained by: Family member   Currently in Pain? No/denies               ADULT SLP TREATMENT - 11/19/15 0001    General Information   Behavior/Cognition Alert;Cooperative;Pleasant mood   Treatment Provided   Treatment provided Cognitive-Linquistic   Pain Assessment   Pain Assessment No/denies pain   Cognitive-Linquistic Treatment   Treatment focused on Aphasia   Skilled Treatment AUDITORY COMPREHENSION: Comprehend miscellaneous questions presented auditorily with 60% accuracy; increases to 100% given use of strategy (write pertinent information) and repetition of stimulus.  READING COMPREHENSION:  Read/comprehend written logical solutions task ("tricky questions") given mod cues locate information, process significance of information, and use strategies to recall and utilize information.   VERBAL EXPRESSION:  State intangible given description with 70% accuracy.  Insert missing grammatical/function words into sentences.     Assessment  / Recommendations / Plan   Plan Continue with current plan of care   Progression Toward Goals   Progression toward goals Progressing toward goals          SLP Education - 11/19/15 1743    Education provided Yes   Education Details strategies to improve auditory comprehension and verbal expression   Person(s) Educated Patient;Spouse   Methods Explanation   Comprehension Verbalized understanding            SLP Long Term Goals - 11/19/15 1745    SLP LONG TERM GOAL #1   Title Patient will complete 2 unit processing tasks with 80% accuracy without the need of repetition of task instructions or significant delays in responding.   Status Achieved   SLP LONG TERM GOAL #2   Title Patient will complete 3 unit processing tasks with 80% accuracy without the need of repetition of task instructions or significant delays in responding.   Status Achieved   SLP LONG TERM GOAL #3   Title Patient will demonstrate reading comprehension for sentences with 80% accuracy.   Status Achieved   SLP LONG TERM GOAL #4   Title Patient will demonstrate reading comprehension for paragraphs with 80% accuracy.   Status Partially Met   SLP LONG TERM GOAL #5   Status Partially Met          Plan - 11/19/15 1744    Clinical Impression Statement The patient is improving in oral and written comprehension.  He is completing reading and listening exercises at home.  Patient demonstrates significant improvement in level of complexity of written information that he can comprehend.  The patient is demonstrating errors in verbal output and this will be address in future sessions.   Speech Therapy Frequency 2x / week   Duration Other (comment)   Treatment/Interventions Language facilitation;SLP instruction and feedback;Compensatory strategies;Patient/family education;Other (comment)   Potential to Achieve Goals Good   Potential Considerations Ability to learn/carryover information;Severity of impairments;Previous  level of function;Cooperation/participation level   Consulted and Agree with Plan of Care Patient;Family member/caregiver   Family Member Consulted spouse      Patient will benefit from skilled therapeutic intervention in order to improve the following deficits and impairments:   Aphasia      G-Codes - 11/20/2015 1746    Functional Assessment Tool Used therapeutic tasks, clinical judgment   Functional Limitations Spoken language comprehension   Spoken Language Comprehension Current Status [phone]) At least 20 percent but less than 40 percent impaired, limited or restricted   Spoken Language Comprehension Goal Status (G9160) At least 1 percent but less than 20 percent impaired, limited or restricted      Problem List Patient Active Problem List   Diagnosis Date Noted  . CVA (cerebral infarction) 08/21/2015  . TIA (transient ischemic attack) 08/20/2015  . Acute encephalopathy 08/20/2015   Leroy Sea, MS/CCC- SLP  Lou Miner 2015-11-20, 5:47 PM  [place] MAIN [facility] SERVICES [address] [place], [place], [address] Phone: [phone]   Fax:  [phone]   Name: Dalton Obrien MRN: [id number] Date of Birth: 06-02-1941

## 2015-11-21 ENCOUNTER — Encounter: Payer: Self-pay | Admitting: Speech Pathology

## 2015-11-21 ENCOUNTER — Ambulatory Visit: Payer: Medicare HMO | Admitting: Speech Pathology

## 2015-11-21 DIAGNOSIS — R4701 Aphasia: Secondary | ICD-10-CM | POA: Diagnosis not present

## 2015-11-21 NOTE — Therapy (Signed)
[place] MAIN [facility] SERVICES [address] [place], [place], [address] Phone: [phone]   Fax:  [phone]  Speech Language Pathology Treatment  Patient Details  Name: Dalton Obrien MRN: [id number] Date of Birth: [birth year] Referring Provider: Gurney Maxin MD  Encounter Date: 11/21/2015      End of Session - 11/21/15 1659    Visit Number 21   Number of Visits 25   Date for SLP Re-Evaluation 11/29/15   SLP Start Time 0902   SLP Stop Time  1000   SLP Time Calculation (min) 58 min   Activity Tolerance Patient tolerated treatment well      Past Medical History  Diagnosis Date  . Asthma   . 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 No pertinent past surgical history.  There were no vitals filed for this visit.      Subjective Assessment - 11/21/15 1658    Subjective Patient states he is working on his communication skills "16 hours a day"   Patient is accompained by: Family member   Currently in Pain? No/denies               ADULT SLP TREATMENT - 11/21/15 0001    General Information   Behavior/Cognition Alert;Cooperative;Pleasant mood   Treatment Provided   Treatment provided Cognitive-Linquistic   Pain Assessment   Pain Assessment No/denies pain   Cognitive-Linquistic Treatment   Treatment focused on Aphasia   Skilled Treatment AUDITORY COMPREHENSION: Comprehend miscellaneous questions presented auditorily with 60% accuracy; increases to 100% given use of strategy (write pertinent information) and repetition of stimulus.  READING COMPREHENSION:  Read/comprehend written logical solutions task ("literal meaning") given mod cues locate information, process significance of information, and use strategies to recall and utilize information.   Patient demonstrates significantly more difficulty with lengthy vs. shorter paragraphs.  VERBAL EXPRESSION:  generate cogent and grammatical sentences to explain  logical solution with 70% accuracy.     Assessment / Recommendations / Plan   Plan Continue with current plan of care   Progression Toward Goals   Progression toward goals Progressing toward goals          SLP Education - 11/21/15 1659    Education provided Yes   Education Details strategies to improve auditory comprehension and verbal expression   Person(s) Educated Patient;Spouse   Methods Explanation   Comprehension Verbalized understanding            SLP Long Term Goals - 11/19/15 1745    SLP LONG TERM GOAL #1   Title Patient will complete 2 unit processing tasks with 80% accuracy without the need of repetition of task instructions or significant delays in responding.   Status Achieved   SLP LONG TERM GOAL #2   Title Patient will complete 3 unit processing tasks with 80% accuracy without the need of repetition of task instructions or significant delays in responding.   Status Achieved   SLP LONG TERM GOAL #3   Title Patient will demonstrate reading comprehension for sentences with 80% accuracy.   Status Achieved   SLP LONG TERM GOAL #4   Title Patient will demonstrate reading comprehension for paragraphs with 80% accuracy.   Status Partially Met   SLP LONG TERM GOAL #5   Status Partially Met          Plan - 11/21/15 1700    Clinical Impression Statement The patient is improving in oral and written comprehension.  He is completing reading and listening exercises at home.  Patient demonstrates significant improvement in level of complexity of written information that he can comprehend.  The patient is demonstrating errors in verbal output and this will be address in future sessions.   Speech Therapy Frequency 2x / week   Duration Other (comment)   Treatment/Interventions Language facilitation;SLP instruction and feedback;Compensatory strategies;Patient/family education;Other (comment)   Potential to Achieve Goals Good   Potential Considerations Ability to  learn/carryover information;Severity of impairments;Previous level of function;Cooperation/participation level   Consulted and Agree with Plan of Care Patient;Family member/caregiver   Family Member Consulted spouse      Patient will benefit from skilled therapeutic intervention in order to improve the following deficits and impairments:   Aphasia    Problem List Patient Active Problem List   Diagnosis Date Noted  . CVA (cerebral infarction) 08/21/2015  . TIA (transient ischemic attack) 08/20/2015  . Acute encephalopathy 08/20/2015   Leroy Sea, MS/CCC- SLP  Lou Miner 11/21/2015, 5:01 PM  [place] MAIN [facility] SERVICES [address] [place], [place], [address] Phone: [phone]   Fax:  [phone]   Name: LEV CERVONE MRN: [id number] Date of Birth: Jan 14, 1941

## 2015-11-26 ENCOUNTER — Ambulatory Visit: Payer: Medicare HMO | Admitting: Speech Pathology

## 2015-11-26 DIAGNOSIS — R4701 Aphasia: Secondary | ICD-10-CM

## 2015-11-27 ENCOUNTER — Encounter: Payer: Self-pay | Admitting: Speech Pathology

## 2015-11-27 NOTE — Therapy (Signed)
[place] MAIN [facility] SERVICES [address] [place], [place], [address] Phone: [phone]   Fax:  [phone]  Speech Language Pathology Treatment  Patient Details  Name: Dalton Obrien MRN: [id number] Date of Birth: 1940-07-14 Referring Provider: Gurney Maxin MD  Encounter Date: 11/26/2015      End of Session - 11/27/15 0832    Visit Number 22   Number of Visits 25   Date for SLP Re-Evaluation 11/29/15   SLP Start Time 7   SLP Stop Time  1500   SLP Time Calculation (min) 60 min   Activity Tolerance Patient tolerated treatment well      Past Medical History  Diagnosis Date  . Asthma   . 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 No pertinent past surgical history.  There were no vitals filed for this visit.      Subjective Assessment - 11/27/15 0831    Subjective Patient states he is working on his communication skills "16 hours a day"   Patient is accompained by: Family member   Currently in Pain? No/denies               ADULT SLP TREATMENT - 11/27/15 0001    General Information   Behavior/Cognition Alert;Cooperative;Pleasant mood   Treatment Provided   Treatment provided Cognitive-Linquistic   Pain Assessment   Pain Assessment No/denies pain   Cognitive-Linquistic Treatment   Treatment focused on Aphasia   Skilled Treatment AUDITORY COMPREHENSION: Comprehend miscellaneous questions presented auditorily with 60% accuracy; increases to 100% given use of strategy (write pertinent information) and repetition of stimulus.  READING COMPREHENSION:  Read/comprehend written math word problems given mod cues locate information, process significance of information, and use strategies to recall and utilize information.   Patient demonstrates significantly more difficulty with lengthy vs. shorter paragraphs.  VERBAL EXPRESSION:  generate cogent and grammatical sentences to explain logical solution with 70%  accuracy.     Assessment / Recommendations / Plan   Plan Continue with current plan of care   Progression Toward Goals   Progression toward goals Progressing toward goals          SLP Education - 11/27/15 0831    Education provided Yes   Education Details strategies to improve auditory comprehension and verbal expression   Person(s) Educated Patient;Spouse   Methods Explanation   Comprehension Verbalized understanding            SLP Long Term Goals - 11/19/15 1745    SLP LONG TERM GOAL #1   Title Patient will complete 2 unit processing tasks with 80% accuracy without the need of repetition of task instructions or significant delays in responding.   Status Achieved   SLP LONG TERM GOAL #2   Title Patient will complete 3 unit processing tasks with 80% accuracy without the need of repetition of task instructions or significant delays in responding.   Status Achieved   SLP LONG TERM GOAL #3   Title Patient will demonstrate reading comprehension for sentences with 80% accuracy.   Status Achieved   SLP LONG TERM GOAL #4   Title Patient will demonstrate reading comprehension for paragraphs with 80% accuracy.   Status Partially Met   SLP LONG TERM GOAL #5   Status Partially Met          Plan - 11/27/15 0833    Clinical Impression Statement The patient is improving in oral and written comprehension.  He  is completing reading and listening exercises at home.  Patient demonstrates significant improvement in level of complexity of written information that he can comprehend.  The patient is demonstrating errors in verbal output and this will be address in future sessions.   Speech Therapy Frequency 2x / week   Duration Other (comment)   Treatment/Interventions Language facilitation;SLP instruction and feedback;Compensatory strategies;Patient/family education;Other (comment)   Potential to Achieve Goals Good   Potential Considerations Ability to learn/carryover information;Severity  of impairments;Previous level of function;Cooperation/participation level   Consulted and Agree with Plan of Care Patient;Family member/caregiver   Family Member Consulted spouse      Patient will benefit from skilled therapeutic intervention in order to improve the following deficits and impairments:   Aphasia    Problem List Patient Active Problem List   Diagnosis Date Noted  . CVA (cerebral infarction) 08/21/2015  . TIA (transient ischemic attack) 08/20/2015  . Acute encephalopathy 08/20/2015   Leroy Sea, MS/CCC- SLP  Lou Miner 11/27/2015, 8:34 AM  [place] MAIN [facility] SERVICES [address] [place], [place], [address] Phone: [phone]   Fax:  [phone]   Name: JAHFARI AMBERS MRN: [id number] Date of Birth: [birth year]

## 2015-12-04 ENCOUNTER — Encounter: Payer: Self-pay | Admitting: Speech Pathology

## 2015-12-04 ENCOUNTER — Ambulatory Visit: Payer: Medicare HMO | Admitting: Speech Pathology

## 2015-12-04 DIAGNOSIS — R4701 Aphasia: Secondary | ICD-10-CM

## 2015-12-04 NOTE — Therapy (Signed)
[place] MAIN [facility] SERVICES [address] [place], [place], [address] Phone: [phone]   Fax:  [phone]  Speech Language Pathology Treatment/Re-Certification  Patient Details  Name: Dalton Obrien MRN: [id number] Date of Birth: 05-05-1941 Referring Provider: Gurney Maxin MD  Encounter Date: 12/04/2015      End of Session - 12/04/15 1314    Visit Number 23   Number of Visits 37   Date for SLP Re-Evaluation 01/15/16   SLP Start Time 1000   SLP Stop Time  1100   SLP Time Calculation (min) 60 min   Activity Tolerance Patient tolerated treatment well      Past Medical History  Diagnosis Date  . Asthma   . 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 No pertinent past surgical history.  There were no vitals filed for this visit.      Subjective Assessment - 12/04/15 1314    Subjective Patient states he is working on his communication skills "16 hours a day"   Patient is accompained by: Family member   Currently in Pain? No/denies               ADULT SLP TREATMENT - 12/04/15 0001    General Information   Behavior/Cognition Alert;Cooperative;Pleasant mood   Treatment Provided   Treatment provided Cognitive-Linquistic   Pain Assessment   Pain Assessment No/denies pain   Cognitive-Linquistic Treatment   Treatment focused on Aphasia   Skilled Treatment AUDITORY COMPREHENSION: Comprehend miscellaneous questions presented auditorily with 60% accuracy; increases to 100% given use of strategy (write pertinent information) and repetition of stimulus.  READING COMPREHENSION:  Read/comprehend written math word problems given mod cues locate information, process significance of information, and use strategies to recall and utilize information.   Patient demonstrates significantly more difficulty with lengthy vs. shorter paragraphs.  VERBAL EXPRESSION:  generate cogent and grammatical sentences to explain logical  solution with 70% accuracy.     Assessment / Recommendations / Plan   Plan Continue with current plan of care   Progression Toward Goals   Progression toward goals Progressing toward goals          SLP Education - 12/04/15 1314    Education provided Yes   Education Details strategies to improve auditory comprehension and verbal expression   Person(s) Educated Patient;Spouse   Methods Explanation   Comprehension Verbalized understanding            SLP Long Term Goals - 12/04/15 1316    SLP LONG TERM GOAL #4   Title Patient will demonstrate reading comprehension for paragraphs with 80% accuracy.   Status Partially Met   SLP LONG TERM GOAL #5   Title Patient will generate grammatical and cogent sentence to complete abstract/complex linguistic task with 80% accuracy.   Status Partially Met   Additional Long Term Goals   Additional Long Term Goals Yes   SLP LONG TERM GOAL #6   Title Patient will demonstrate auditory comprehension for moderately complex and lengthy verbal stimuli with 80% accuracy.   Status New          Plan - 12/04/15 1316    Clinical Impression Statement The patient is improving in oral and written comprehension.  He is completing reading and listening exercises at home.  Patient demonstrates significant improvement in level of complexity of written information that he can comprehend.  The patient is demonstrating errors in verbal output and this will be address  in future sessions, as well as ongoing comprehension activities.   Speech Therapy Frequency 2x / week   Duration Other (comment)  6 weeks   Treatment/Interventions Language facilitation;SLP instruction and feedback;Compensatory strategies;Patient/family education;Other (comment)   Potential to Achieve Goals Good   Potential Considerations Ability to learn/carryover information;Severity of impairments;Previous level of function;Cooperation/participation level   Consulted and Agree with Plan of Care  Patient;Family member/caregiver   Family Member Consulted spouse      Patient will benefit from skilled therapeutic intervention in order to improve the following deficits and impairments:   Aphasia - Plan: SLP plan of care cert/re-cert    Problem List Patient Active Problem List   Diagnosis Date Noted  . CVA (cerebral infarction) 08/21/2015  . TIA (transient ischemic attack) 08/20/2015  . Acute encephalopathy 08/20/2015   Leroy Sea, MS/CCC- SLP  Lou Miner 12/04/2015, Wandra Mannan PM  [place] MAIN [facility] SERVICES [address] [place], [place], [address] Phone: [phone]   Fax:  [phone]   Name: Dalton Obrien MRN: [id number] Date of Birth: [birth year]

## 2015-12-06 ENCOUNTER — Ambulatory Visit: Payer: Medicare HMO | Attending: Neurology | Admitting: Speech Pathology

## 2015-12-06 ENCOUNTER — Encounter: Payer: Self-pay | Admitting: Speech Pathology

## 2015-12-06 DIAGNOSIS — R4701 Aphasia: Secondary | ICD-10-CM | POA: Diagnosis present

## 2015-12-06 NOTE — Therapy (Signed)
[place] MAIN [facility] SERVICES [address] [place], [place], [address] Phone: [phone]   Fax:  [phone]  Speech Language Pathology Treatment  Patient Details  Name: BRYN PERKIN MRN: [id number] Date of Birth: 11-04-1940 Referring Provider: Gurney Maxin MD  Encounter Date: 12/06/2015      End of Session - 12/06/15 1349    Visit Number 24   Number of Visits 37   Date for SLP Re-Evaluation 01/15/16   SLP Start Time 1000   SLP Stop Time  1100   SLP Time Calculation (min) 60 min   Activity Tolerance Patient tolerated treatment well      Past Medical History  Diagnosis Date  . Asthma   . 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 No pertinent past surgical history.  There were no vitals filed for this visit.      Subjective Assessment - 12/06/15 1348    Subjective Mr. Reidinger was alert and engaged throughout the session. He reports that his language abilities vary from day to day but that he does think he is improving over time. Mr. Winchell also reports that he is comfortable asking others to slow down or repeat what they are saying and that he does this often.    Patient is accompained by: Family member   Currently in Pain? No/denies               ADULT SLP TREATMENT - 12/06/15 0001    General Information   Behavior/Cognition Alert;Cooperative;Pleasant mood   Treatment Provided   Treatment provided Cognitive-Linquistic   Pain Assessment   Pain Assessment No/denies pain   Cognitive-Linquistic Treatment   Treatment focused on Aphasia   Skilled Treatment READING COMPREHENSION: Mr. Chenier completed exercises using figurative language with 95% accuracy without clinician cueing. AUDITORY COMPREHENSION: Mr. Donald completed 12 math word problems with 100% accuracy, needing repetitions and minimal cues on 6 of the items. Presented with 5 options, Mr. Gaumer accurately determined which option did not fit  with the others 100% of the time using strategies including repetition and writing the options. VERBAL EXPRESSION:  Mr. Spang was able to clearly explain his logic when answering questions 90% of the time.    Assessment / Recommendations / Plan   Plan Continue with current plan of care   Progression Toward Goals   Progression toward goals Progressing toward goals          SLP Education - 12/06/15 1348    Education provided Yes   Education Details Strategies to improve auditorycomprehension and verbal expression   Person(s) Educated Patient;Spouse   Methods Explanation   Comprehension Verbalized understanding            SLP Long Term Goals - 12/04/15 1316    SLP LONG TERM GOAL #4   Title Patient will demonstrate reading comprehension for paragraphs with 80% accuracy.   Status Partially Met   SLP LONG TERM GOAL #5   Title Patient will generate grammatical and cogent sentence to complete abstract/complex linguistic task with 80% accuracy.   Status Partially Met   Additional Long Term Goals   Additional Long Term Goals Yes   SLP LONG TERM GOAL #6   Title Patient will demonstrate auditory comprehension for moderately complex and lengthy verbal stimuli with 80% accuracy.   Status New          Plan - 12/06/15 1349    Clinical Impression Statement Mr. Para  showed great improvement with the auditory comprehension task when compared to the previous session. At one point during the session he was distracted by conversation outside the therapy room. The clinician urged him to continue despite the noise and he was able to complete the task, but at a much slower rate. Further work will be needed for Mr. Cawood to be successful in less controlled environments.   Speech Therapy Frequency 2x / week   Duration Other (comment)   Treatment/Interventions Language facilitation;SLP instruction and feedback;Compensatory strategies;Patient/family education;Other (comment)   Potential to  Achieve Goals Good   Potential Considerations Ability to learn/carryover information;Severity of impairments;Previous level of function;Cooperation/participation level   Consulted and Agree with Plan of Care Patient;Family member/caregiver   Family Member Consulted spouse      Patient will benefit from skilled therapeutic intervention in order to improve the following deficits and impairments:   Aphasia    Problem List Patient Active Problem List   Diagnosis Date Noted  . CVA (cerebral infarction) 08/21/2015  . TIA (transient ischemic attack) 08/20/2015  . Acute encephalopathy 08/20/2015   Leroy Sea, MS/CCC- SLP  Lou Miner 12/06/2015, 1:50 PM  [place] MAIN [facility] SERVICES [address] [place], [place], [address] Phone: [phone]   Fax:  [phone]   Name: CAILEAN HEACOCK MRN: [id number] Date of Birth: 01/29/1941

## 2015-12-10 ENCOUNTER — Ambulatory Visit: Payer: Medicare HMO | Admitting: Speech Pathology

## 2015-12-10 ENCOUNTER — Encounter: Payer: Self-pay | Admitting: Speech Pathology

## 2015-12-10 DIAGNOSIS — R4701 Aphasia: Secondary | ICD-10-CM | POA: Diagnosis not present

## 2015-12-10 NOTE — Therapy (Signed)
[place] MAIN [facility] SERVICES [address] [place], [place], [address] Phone: [phone]   Fax:  [phone]  Speech Language Pathology Treatment  Patient Details  Name: Dalton Obrien MRN: [id number] Date of Birth: 03-08-1941 Referring Provider: Gurney Maxin MD  Encounter Date: 12/10/2015      End of Session - 12/10/15 1652    Visit Number 25   Number of Visits 37   Date for SLP Re-Evaluation 01/15/16   SLP Start Time 1000   SLP Stop Time  1100   SLP Time Calculation (min) 60 min   Activity Tolerance Patient tolerated treatment well      Past Medical History  Diagnosis Date  . Asthma   . 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 No pertinent past surgical history.  There were no vitals filed for this visit.      Subjective Assessment - 12/10/15 1651    Subjective Dalton Obrien was alert and engaged throughout the session. He reports that some days are better than others but that he feels he is making progress. He also expressed that he thinks his attention and concentration are at the level they were prior to his stroke.   Patient is accompained by: Family member   Currently in Pain? No/denies               ADULT SLP TREATMENT - 12/10/15 0001    General Information   Behavior/Cognition Alert;Cooperative;Pleasant mood   Cognitive-Linquistic Treatment   Treatment focused on Aphasia   Skilled Treatment READING COMPREHENSION: When presented with a figurative language activity which he had completed with great success last week, Dalton Obrien was unable to comprehend/attempt that same activity as long as talk radio was playing softly in the background. He completed the activity with 95% accuracy once the noise was stopped. AUDITORY COMPREHENSION: 80% accuracy on math/logic problems when working independently. This increased to 100% with repetition and minimal clinician cues. When provided with 5 words, Mr.  Obrien was able to correctly identify which term didn't belong and explain his thinking clearly 65% of the time independently and 100% of the time with one repetition. He was encouraged to complete these tasks with his eyes open if possible which did not affect his performance. During these activities Dalton Obrien experienced several semantic paraphasias (i.e. singer instead of dance when describing a waltz).    Assessment / Recommendations / Plan   Plan Continue with current plan of care   Progression Toward Goals   Progression toward goals Progressing toward goals          SLP Education - 12/10/15 1652    Education provided Yes   Education Details Strategies to improve auditory comprehension and verbal expression   Person(s) Educated Patient;Spouse   Methods Explanation   Comprehension Verbalized understanding            SLP Long Term Goals - 12/04/15 1316    SLP LONG TERM GOAL #4   Title Patient will demonstrate reading comprehension for paragraphs with 80% accuracy.   Status Partially Met   SLP LONG TERM GOAL #5   Title Patient will generate grammatical and cogent sentence to complete abstract/complex linguistic task with 80% accuracy.   Status Partially Met   Additional Long Term Goals   Additional Long Term Goals Yes   SLP LONG TERM GOAL #6   Title Patient will demonstrate auditory comprehension for moderately complex and lengthy verbal  stimuli with 80% accuracy.   Status New          Plan - 12/10/15 1653    Clinical Impression Statement Dalton Obrien continues to improve his reading and auditory comprehension during these structured activities. He was unaware of his semantic paraphasias, but was receptive to the idea that they had happened when corrected. Dalton Obrien was reminded to use his strategies, such as closing his eyes to listen, but to be mindful that he should only use them to resolve an issue, not as a habit in cases where he doesn't need them   Speech Therapy  Frequency 2x / week   Duration Other (comment)   Treatment/Interventions Language facilitation;SLP instruction and feedback;Compensatory strategies;Patient/family education;Other (comment)   Potential to Achieve Goals Good   Potential Considerations Ability to learn/carryover information;Severity of impairments;Previous level of function;Cooperation/participation level   Consulted and Agree with Plan of Care Patient;Family member/caregiver   Family Member Consulted spouse      Patient will benefit from skilled therapeutic intervention in order to improve the following deficits and impairments:   Aphasia    Problem List Patient Active Problem List   Diagnosis Date Noted  . CVA (cerebral infarction) 08/21/2015  . TIA (transient ischemic attack) 08/20/2015  . Acute encephalopathy 08/20/2015   Leroy Sea, MS/CCC- SLP  Lou Miner 12/10/2015, 4:54 PM  [place] MAIN [facility] SERVICES [address] [place], [place], [address] Phone: [phone]   Fax:  [phone]   Name: JAMICHAEL KNOTTS MRN: [id number] Date of Birth: Mar 21, 1941

## 2015-12-12 ENCOUNTER — Ambulatory Visit: Payer: Medicare HMO | Admitting: Speech Pathology

## 2015-12-12 ENCOUNTER — Encounter: Payer: Self-pay | Admitting: Speech Pathology

## 2015-12-12 DIAGNOSIS — R4701 Aphasia: Secondary | ICD-10-CM | POA: Diagnosis not present

## 2015-12-12 NOTE — Therapy (Signed)
[place] MAIN [facility] SERVICES [address] [place], [place], [address] Phone: [phone]   Fax:  [phone]  Speech Language Pathology Treatment  Patient Details  Name: Dalton Obrien MRN: [id number] Date of Birth: February 01, 1941 Referring Provider: Gurney Maxin MD  Encounter Date: 12/12/2015      End of Session - 12/12/15 1323    Visit Number 26   Number of Visits 37   Date for SLP Re-Evaluation 01/15/16   SLP Start Time 1000   SLP Stop Time  1100   SLP Time Calculation (min) 60 min   Activity Tolerance Patient tolerated treatment well      Past Medical History  Diagnosis Date  . Asthma   . 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 No pertinent past surgical history.  There were no vitals filed for this visit.      Subjective Assessment - 12/12/15 1321    Subjective V   Patient is accompained by: Family member   Currently in Pain? No/denies               ADULT SLP TREATMENT - 12/12/15 0001    General Information   Behavior/Cognition Alert;Cooperative;Pleasant mood   Treatment Provided   Treatment provided Cognitive-Linquistic   Pain Assessment   Pain Assessment No/denies pain   Cognitive-Linquistic Treatment   Treatment focused on Aphasia   Skilled Treatment AUDITORY COMPREHENSION: Comprehend math word problems presented auditorily with 30% accuracy with one presentation of stimulus; increases to 80% given one repetition of stimulus.  READING COMPREHENSION:  Read/comprehend paragraph length directions for a novel task given mod cues locate information, process significance of information, and use strategies to recall and utilize information.   Complete the task accurately once the patient thoroughly understood task.  Required mod-max cues to understand written directions for a logic puzzle task.      Assessment / Recommendations / Plan   Plan Continue with current plan of care   Progression Toward  Goals   Progression toward goals Progressing toward goals          SLP Education - 12/12/15 1322    Education provided Yes   Education Details Strategies to imrove auditory comprehension and verbal expression   Person(s) Educated Patient;Spouse   Methods Explanation   Comprehension Verbalized understanding            SLP Long Term Goals - 12/04/15 1316    SLP LONG TERM GOAL #4   Title Patient will demonstrate reading comprehension for paragraphs with 80% accuracy.   Status Partially Met   SLP LONG TERM GOAL #5   Title Patient will generate grammatical and cogent sentence to complete abstract/complex linguistic task with 80% accuracy.   Status Partially Met   Additional Long Term Goals   Additional Long Term Goals Yes   SLP LONG TERM GOAL #6   Title Patient will demonstrate auditory comprehension for moderately complex and lengthy verbal stimuli with 80% accuracy.   Status New          Plan - 12/12/15 1323    Clinical Impression Statement The patient is improving in oral and written comprehension.  He is completing reading and listening exercises at home.  Patient demonstrates significant improvement in level of complexity of written information that he can comprehend.  The patient is demonstrating errors in verbal output and this will be address in future sessions, as well as ongoing comprehension activities.  Speech Therapy Frequency 2x / week   Duration Other (comment)   Treatment/Interventions Language facilitation;SLP instruction and feedback;Compensatory strategies;Patient/family education;Other (comment)   Potential to Achieve Goals Good   Potential Considerations Ability to learn/carryover information;Severity of impairments;Previous level of function;Cooperation/participation level   Consulted and Agree with Plan of Care Patient;Family member/caregiver   Family Member Consulted spouse      Patient will benefit from skilled therapeutic intervention in order to  improve the following deficits and impairments:   Aphasia    Problem List Patient Active Problem List   Diagnosis Date Noted  . CVA (cerebral infarction) 08/21/2015  . TIA (transient ischemic attack) 08/20/2015  . Acute encephalopathy 08/20/2015   Leroy Sea, MS/CCC- SLP  Lou Miner 12/12/2015, 1:24 PM  [place] MAIN [facility] SERVICES [address] [place], [place], [address] Phone: [phone]   Fax:  [phone]   Name: RAHIM ASTORGA MRN: [id number] Date of Birth: [birth year]

## 2015-12-17 ENCOUNTER — Ambulatory Visit: Payer: Medicare HMO | Admitting: Speech Pathology

## 2015-12-17 DIAGNOSIS — R4701 Aphasia: Secondary | ICD-10-CM | POA: Diagnosis not present

## 2015-12-18 ENCOUNTER — Encounter: Payer: Self-pay | Admitting: Speech Pathology

## 2015-12-18 NOTE — Therapy (Signed)
[place] MAIN [facility] SERVICES [address] [place], [place], [address] Phone: [phone]   Fax:  [phone]  Speech Language Pathology Treatment  Patient Details  Name: Dalton Obrien MRN: [id number] Date of Birth: February 03, 1941 Referring Provider: Gurney Maxin MD  Encounter Date: 12/17/2015      End of Session - 12/18/15 0841    Visit Number 27   Number of Visits 37   Date for SLP Re-Evaluation 01/15/16   SLP Start Time 1330   SLP Stop Time  1430   SLP Time Calculation (min) 60 min   Activity Tolerance Patient tolerated treatment well      Past Medical History  Diagnosis Date  . Asthma   . 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 No pertinent past surgical history.  There were no vitals filed for this visit.      Subjective Assessment - 12/18/15 0841    Subjective Dalton Obrien was alert and engaged throughout the session. He stated that he was having a harder time speaking today than normal but that he occasionally has days which are like this. He feels that he is making progress overall.   Patient is accompained by: Family member   Currently in Pain? No/denies               ADULT SLP TREATMENT - 12/18/15 0001    General Information   Behavior/Cognition Alert;Cooperative;Pleasant mood   Treatment Provided   Treatment provided Cognitive-Linquistic   Pain Assessment   Pain Assessment No/denies pain   Cognitive-Linquistic Treatment   Treatment focused on Aphasia   Skilled Treatment Dalton Obrien completed [place] Coloured Progressive Matrices with 100% accuracy in 7:06 AUDITORY COMPREHENSION Dalton Obrien solved short math word problems read by the clinician with 100% accuracy with occasional repetitions from the clinician. When presented with 5 words read over soft music, Dalton Obrien was able to state which word didn't belong and why with 95% accuracy with occasional repetitions by the clinician. READING  COMPREHENSION Dalton Obrien was able to complete 3 intro level logic puzzles with moderate cues from the clinician, including rewording of instructions and reminders to look at the answer chart as well as the clues.   Assessment / Recommendations / Plan   Plan Continue with current plan of care   Progression Toward Goals   Progression toward goals Progressing toward goals          SLP Education - 12/18/15 0841    Education provided Yes   Education Details Strategies to improve auditory and reading comprehension   Person(s) Educated Patient;Spouse   Methods Explanation   Comprehension Verbalized understanding            SLP Long Term Goals - 12/04/15 1316    SLP LONG TERM GOAL #4   Title Patient will demonstrate reading comprehension for paragraphs with 80% accuracy.   Status Partially Met   SLP LONG TERM GOAL #5   Title Patient will generate grammatical and cogent sentence to complete abstract/complex linguistic task with 80% accuracy.   Status Partially Met   Additional Long Term Goals   Additional Long Term Goals Yes   SLP LONG TERM GOAL #6   Title Patient will demonstrate auditory comprehension for moderately complex and lengthy verbal stimuli with 80% accuracy.   Status New          Plan - 12/18/15 0842    Clinical Impression Statement Dalton Obrien continues  to make progress with reading and listening comprehension. He is able to use the resources available to him (tablet, asking for repetitions) when a communication breakdown occurs, and will benefit from continued skilled intervention to improve his ability to use his strategies independently.   Speech Therapy Frequency 2x / week   Duration Other (comment)   Treatment/Interventions Language facilitation;SLP instruction and feedback;Compensatory strategies;Patient/family education;Other (comment)   Potential to Achieve Goals Good   Potential Considerations Ability to learn/carryover information;Severity of  impairments;Previous level of function;Cooperation/participation level   SLP Home Exercise Plan Practice strategies across varied situations   Consulted and Agree with Plan of Care Patient;Family member/caregiver   Family Member Consulted spouse      Patient will benefit from skilled therapeutic intervention in order to improve the following deficits and impairments:   Aphasia    Problem List Patient Active Problem List   Diagnosis Date Noted  . CVA (cerebral infarction) 08/21/2015  . TIA (transient ischemic attack) 08/20/2015  . Acute encephalopathy 08/20/2015    Leroy Kennedy 12/18/2015, 8:43 AM  [place] MAIN [facility] SERVICES [address] [place], [place], [address] Phone: [phone]   Fax:  [phone]   Name: Dalton Obrien MRN: [id number] Date of Birth: 09/21/1940

## 2015-12-20 ENCOUNTER — Ambulatory Visit: Payer: Medicare HMO | Admitting: Speech Pathology

## 2015-12-20 ENCOUNTER — Encounter: Payer: Self-pay | Admitting: Speech Pathology

## 2015-12-20 DIAGNOSIS — R4701 Aphasia: Secondary | ICD-10-CM | POA: Diagnosis not present

## 2015-12-20 NOTE — Therapy (Signed)
[place] MAIN [facility] SERVICES [address] [place], [place], [address] Phone: [phone]   Fax:  [phone]  Speech Language Pathology Treatment  Patient Details  Name: Dalton Obrien MRN: [id number] Date of Birth: 02/25/1941 Referring Provider: Gurney Maxin MD  Encounter Date: 12/20/2015      End of Session - 12/20/15 1504    Visit Number 28   Number of Visits 37   Date for SLP Re-Evaluation 01/15/16   SLP Start Time 60   SLP Stop Time  1200   SLP Time Calculation (min) 60 min   Activity Tolerance Patient tolerated treatment well      Past Medical History  Diagnosis Date  . Asthma   . 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 No pertinent past surgical history.  There were no vitals filed for this visit.      Subjective Assessment - 12/20/15 1503    Subjective Dalton Obrien was alert and engaged throughout the session. He stated that he feels good about his language and that he is able to do everything he wants to. 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   Patient is accompained by: Family member   Currently in Pain? No/denies               ADULT SLP TREATMENT - 12/20/15 0001    General Information   Behavior/Cognition Alert;Cooperative;Pleasant mood   Treatment Provided   Treatment provided Cognitive-Linquistic   Pain Assessment   Pain Assessment No/denies pain   Cognitive-Linquistic Treatment   Treatment focused on Aphasia   Skilled Treatment REASONING Dalton Obrien was able to complete 3 basic logic puzzles with moderate cues from the clinician (reminders to read carefully, demonstrating strategies). When provided with 2 similar images, Dalton Obrien was able to identify 10/15 differences independently while a talk radio show played quietly. AUDITORY COMPREHENSION: When read simple math word problems by the  clinician, Dalton Obrien was able to answer with 95% accuracy with minimal cues from the clinician. When read 5 words, he was able to identify which did not belong with the others and explain his logic 95% of the time with minimal cues from the clinician (repetitions).   Assessment / Recommendations / Plan   Plan Continue with current plan of care   Progression Toward Goals   Progression toward goals Progressing toward goals          SLP Education - 12/20/15 1503    Education Details auditory comprehension strategies   Person(s) Educated Patient;Spouse   Methods Explanation   Comprehension Verbalized understanding            SLP Long Term Goals - 12/04/15 1316    SLP LONG TERM GOAL #4   Title Patient will demonstrate reading comprehension for paragraphs with 80% accuracy.   Status Partially Met   SLP LONG TERM GOAL #5   Title Patient will generate grammatical and cogent sentence to complete abstract/complex linguistic task with 80% accuracy.   Status Partially Met   Additional Long Term Goals   Additional Long Term Goals Yes   SLP LONG TERM GOAL #6   Title Patient will demonstrate auditory comprehension for moderately complex and lengthy verbal stimuli with 80% accuracy.   Status New          Plan - 12/20/15 1504    Clinical Impression Statement Mr.  Dalton Obrien continues to make progress with his auditory comprehension and reasoning abilities.  He has been encouraged to work with his eyes open unless he feels he needs to close them for concentration. He continues to have difficulty with language which may have alternate meanings or is not completely literal. He will benefit from continued skilled intervention to address these issues.   Speech Therapy Frequency 2x / week   Duration Other (comment)   Treatment/Interventions Language facilitation;SLP instruction and feedback;Compensatory strategies;Patient/family education;Other (comment)   Potential to Achieve Goals Good    Potential Considerations Ability to learn/carryover information;Severity of impairments;Previous level of function;Cooperation/participation level   SLP Home Exercise Plan Practice strategies across varied situations   Consulted and Agree with Plan of Care Patient;Family member/caregiver   Family Member Consulted spouse      Patient will benefit from skilled therapeutic intervention in order to improve the following deficits and impairments:   Aphasia    Problem List Patient Active Problem List   Diagnosis Date Noted  . CVA (cerebral infarction) 08/21/2015  . TIA (transient ischemic attack) 08/20/2015  . Acute encephalopathy 08/20/2015    Dalton Obrien 12/20/2015, 3:05 PM  [place] MAIN [facility] SERVICES [address] [place], [place], [address] Phone: [phone]   Fax:  [phone]   Name: Dalton Obrien MRN: [id number] Date of Birth: 1941/06/08

## 2015-12-24 ENCOUNTER — Ambulatory Visit: Payer: Medicare HMO | Admitting: Speech Pathology

## 2015-12-24 DIAGNOSIS — R4701 Aphasia: Secondary | ICD-10-CM | POA: Diagnosis not present

## 2015-12-25 ENCOUNTER — Encounter: Payer: Self-pay | Admitting: Speech Pathology

## 2015-12-25 NOTE — Therapy (Signed)
[place] MAIN [facility] SERVICES [address] [place], [place], [address] Phone: [phone]   Fax:  [phone]  Speech Language Pathology Treatment  Patient Details  Name: CORVIN SORBO MRN: [id number] Date of Birth: Apr 22, 1941 Referring Provider: Gurney Maxin MD  Encounter Date: 12/24/2015      End of Session - 12/25/15 0817    Visit Number 29   Number of Visits 37   Date for SLP Re-Evaluation 01/15/16   SLP Start Time 51   SLP Stop Time  1400   SLP Time Calculation (min) 60 min   Activity Tolerance Patient tolerated treatment well      Past Medical History  Diagnosis Date  . Asthma   . 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 No pertinent past surgical history.  There were no vitals filed for this visit.      Subjective Assessment - 12/25/15 0817    Subjective Mr. Harte was alert and engaged throughout the session. He doesn't notice day-to day changes in his speech but is able to complete the tasks he wants to.   Patient is accompained by: Family member   Currently in Pain? No/denies               ADULT SLP TREATMENT - 12/25/15 0001    General Information   Behavior/Cognition Alert;Cooperative;Pleasant mood   Treatment Provided   Treatment provided Cognitive-Linquistic   Pain Assessment   Pain Assessment No/denies pain   Cognitive-Linquistic Treatment   Treatment focused on Aphasia   Skilled Treatment REASONING When provided with 2 similar images, Mr. Maneri was able to identify 12/15 differences with minimal cues from the clinician while a talk radio show played quietly. AUDITORY COMPREHENSION: When read simple math word problems by the clinician, Mr. Aprea was able to answer with 95% accuracy with minimal cues (repetitions) from the clinician. When read 5 words, he was able to identify which did not belong with the others and explain his logic 90% of the time with minimal cues from the  clinician (repetitions).   Assessment / Recommendations / Plan   Plan Continue with current plan of care   Progression Toward Goals   Progression toward goals Progressing toward goals          SLP Education - 12/25/15 0817    Education Details Auditory comprehension strategies   Person(s) Educated Patient;Spouse   Methods Explanation   Comprehension Verbalized understanding            SLP Long Term Goals - 12/04/15 1316    SLP LONG TERM GOAL #4   Title Patient will demonstrate reading comprehension for paragraphs with 80% accuracy.   Status Partially Met   SLP LONG TERM GOAL #5   Title Patient will generate grammatical and cogent sentence to complete abstract/complex linguistic task with 80% accuracy.   Status Partially Met   Additional Long Term Goals   Additional Long Term Goals Yes   SLP LONG TERM GOAL #6   Title Patient will demonstrate auditory comprehension for moderately complex and lengthy verbal stimuli with 80% accuracy.   Status New          Plan - 12/25/15 0817    Clinical Impression Statement Mr. Causby continues to make progress especially on working with distractions. He is more aware of keeping his eyes open while others are speaking unless necessary for comprehension. Continued skilled intervention will address his auditory comprehension deficits as  well as his interpretation of more abstract language.   Speech Therapy Frequency 2x / week   Duration Other (comment)   Treatment/Interventions Language facilitation;SLP instruction and feedback;Compensatory strategies;Patient/family education;Other (comment)   Potential to Achieve Goals Good   Potential Considerations Ability to learn/carryover information;Severity of impairments;Previous level of function;Cooperation/participation level   SLP Home Exercise Plan Practice strategies across varied situations   Consulted and Agree with Plan of Care Patient;Family member/caregiver   Family Member Consulted  spouse      Patient will benefit from skilled therapeutic intervention in order to improve the following deficits and impairments:   Aphasia    Problem List Patient Active Problem List   Diagnosis Date Noted  . CVA (cerebral infarction) 08/21/2015  . TIA (transient ischemic attack) 08/20/2015  . Acute encephalopathy 08/20/2015    Leroy Kennedy 12/25/2015, 8:18 AM  [place] MAIN [facility] SERVICES [address] [place], [place], [address] Phone: [phone]   Fax:  [phone]   Name: TOBIN WITUCKI MRN: [id number] Date of Birth: 10/31/1940

## 2015-12-27 ENCOUNTER — Ambulatory Visit: Payer: Medicare HMO | Admitting: Speech Pathology

## 2015-12-27 ENCOUNTER — Encounter: Payer: Self-pay | Admitting: Speech Pathology

## 2015-12-27 DIAGNOSIS — R4701 Aphasia: Secondary | ICD-10-CM

## 2015-12-27 NOTE — Therapy (Signed)
[place] MAIN [facility] SERVICES [address] [place], [place], [address] Phone: [phone]   Fax:  [phone]  Speech Language Pathology Treatment/Progress Note  Patient Details  Name: Dalton Obrien MRN: [id number] Date of Birth: [birth year] Referring Provider: Gurney Maxin MD  Encounter Date: 12/27/2015      End of Session - 12/27/15 1013    Visit Number 30   Number of Visits 37   Date for SLP Re-Evaluation 01/15/16   SLP Start Time 0900   SLP Stop Time  1000   SLP Time Calculation (min) 60 min   Activity Tolerance Patient tolerated treatment well      Past Medical History  Diagnosis Date  . Asthma   . 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 No pertinent past surgical history.  There were no vitals filed for this visit.      Subjective Assessment - 12/27/15 1012    Subjective Patient feels that he is listening and reading better, but continues to have difficulty.   Patient is accompained by: Family member   Currently in Pain? No/denies               ADULT SLP TREATMENT - 12/27/15 0001    General Information   Behavior/Cognition Alert;Cooperative;Pleasant mood   Treatment Provided   Treatment provided Cognitive-Linquistic   Pain Assessment   Pain Assessment No/denies pain   Cognitive-Linquistic Treatment   Treatment focused on Aphasia   Skilled Treatment AUDITORY COMPREHENSION: Give 3 clues presented auditorily state item with 70% accuracy with one presentation of stimulus; increases to 95% given one repetition of stimulus.  READING COMPREHENSION:  Read/comprehend set of directions directions for a novel task given mod cues locate information, process significance of information, and use strategies to recall and utilize information.   Required mod-max cues to Perplexor Level A puzzle.      Assessment / Recommendations / Plan   Plan Continue with current plan of care   Progression Toward Goals    Progression toward goals Progressing toward goals          SLP Education - 12/27/15 1012    Education provided Yes   Education Details Auditory and reading comprehension strategies   Person(s) Educated Patient;Spouse   Methods Explanation   Comprehension Verbalized understanding            SLP Long Term Goals - 12/27/15 1015    SLP LONG TERM GOAL #1   Title Patient will complete 2 unit processing tasks with 80% accuracy without the need of repetition of task instructions or significant delays in responding.   Status Achieved   SLP LONG TERM GOAL #2   Title Patient will complete 3 unit processing tasks with 80% accuracy without the need of repetition of task instructions or significant delays in responding.   Status Achieved   SLP LONG TERM GOAL #3   Title Patient will demonstrate reading comprehension for sentences with 80% accuracy.   Status Achieved   SLP LONG TERM GOAL #4   Title Patient will demonstrate reading comprehension for paragraphs with 80% accuracy.   Status Partially Met   SLP LONG TERM GOAL #5   Title Patient will generate grammatical and cogent sentence to complete abstract/complex linguistic task with 80% accuracy.   Status Partially Met   SLP LONG TERM GOAL #6   Title Patient will demonstrate auditory comprehension for moderately complex and lengthy verbal stimuli with 80% accuracy.  Status Partially Met          Plan - 01-04-16 1013    Clinical Impression Statement The patient is improving in oral and written comprehension.  He is completing reading and listening exercises at home.  Patient demonstrates significant improvement in level of complexity of written information that he can comprehend.     Speech Therapy Frequency 2x / week   Duration Other (comment)   Treatment/Interventions Language facilitation;SLP instruction and feedback;Compensatory strategies;Patient/family education;Other (comment)   Potential to Achieve Goals Good   Potential  Considerations Ability to learn/carryover information;Severity of impairments;Previous level of function;Cooperation/participation level   SLP Home Exercise Plan Practice strategies across varied situations   Consulted and Agree with Plan of Care Patient;Family member/caregiver   Family Member Consulted spouse      Patient will benefit from skilled therapeutic intervention in order to improve the following deficits and impairments:   Aphasia      G-Codes - 04-Jan-2016 1014    Functional Assessment Tool Used therapeutic tasks, clinical judgment   Functional Limitations Spoken language comprehension   Spoken Language Comprehension Current Status [phone]) At least 20 percent but less than 40 percent impaired, limited or restricted   Spoken Language Comprehension Goal Status (G9160) At least 1 percent but less than 20 percent impaired, limited or restricted      Problem List Patient Active Problem List   Diagnosis Date Noted  . CVA (cerebral infarction) 08/21/2015  . TIA (transient ischemic attack) 08/20/2015  . Acute encephalopathy 08/20/2015   Leroy Sea, MS/CCC- SLP  Lou Miner 04-Jan-2016, 10:16 AM  [place] MAIN [facility] SERVICES [address] [place], [place], [address] Phone: [phone]   Fax:  [phone]   Name: DAMASO LADAY MRN: [id number] Date of Birth: Dec 20, 1940

## 2015-12-31 ENCOUNTER — Ambulatory Visit: Payer: Medicare HMO | Admitting: Speech Pathology

## 2015-12-31 DIAGNOSIS — R4701 Aphasia: Secondary | ICD-10-CM

## 2016-01-01 ENCOUNTER — Encounter: Payer: Self-pay | Admitting: Speech Pathology

## 2016-01-01 NOTE — Therapy (Signed)
[place] MAIN [facility] SERVICES [address] [place], [place], [address] Phone: [phone]   Fax:  [phone]  Speech Language Pathology Treatment  Patient Details  Name: Dalton Obrien MRN: [id number] Date of Birth: 1940/11/08 Referring Provider: Gurney Maxin MD  Encounter Date: 12/31/2015      End of Session - 01/01/16 1010    Visit Number 31   Number of Visits 37   Date for SLP Re-Evaluation 01/15/16   SLP Start Time 0900   SLP Stop Time  1000   SLP Time Calculation (min) 60 min   Activity Tolerance Patient tolerated treatment well      Past Medical History  Diagnosis Date  . Asthma   . 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 No pertinent past surgical history.  There were no vitals filed for this visit.      Subjective Assessment - 01/01/16 1009    Subjective Patient feels that he is listening and reading better, but continues to have difficulty.   Currently in Pain? No/denies               ADULT SLP TREATMENT - 01/01/16 0001    General Information   Behavior/Cognition Alert;Cooperative;Pleasant mood   Treatment Provided   Treatment provided Cognitive-Linquistic   Pain Assessment   Pain Assessment No/denies pain   Cognitive-Linquistic Treatment   Treatment focused on Aphasia   Skilled Treatment AUDITORY COMPREHENSION: Given 3 clues, presented auditorily, state item with 70% accuracy with one presentation of stimulus; increases to 95% given one repetition of stimulus.  READING COMPREHENSION:  Read/comprehend set of directions directions for a novel task given mod cues locate information, process significance of information, and use strategies to recall and utilize information.   Required mod-max cues to Perplexor Level A puzzle.      Assessment / Recommendations / Plan   Plan Continue with current plan of care   Progression Toward Goals   Progression toward goals Progressing toward goals          SLP Education - 01/01/16 1010    Education provided Yes   Education Details Auditory and reading comprehension strategies   Person(s) Educated Patient   Methods Explanation   Comprehension Verbalized understanding            SLP Long Term Goals - 12/27/15 1015    SLP LONG TERM GOAL #1   Title Patient will complete 2 unit processing tasks with 80% accuracy without the need of repetition of task instructions or significant delays in responding.   Status Achieved   SLP LONG TERM GOAL #2   Title Patient will complete 3 unit processing tasks with 80% accuracy without the need of repetition of task instructions or significant delays in responding.   Status Achieved   SLP LONG TERM GOAL #3   Title Patient will demonstrate reading comprehension for sentences with 80% accuracy.   Status Achieved   SLP LONG TERM GOAL #4   Title Patient will demonstrate reading comprehension for paragraphs with 80% accuracy.   Status Partially Met   SLP LONG TERM GOAL #5   Title Patient will generate grammatical and cogent sentence to complete abstract/complex linguistic task with 80% accuracy.   Status Partially Met   SLP LONG TERM GOAL #6   Title Patient will demonstrate auditory comprehension for moderately complex and lengthy verbal stimuli with 80% accuracy.   Status Partially Met  Plan - 01/01/16 1010    Clinical Impression Statement The patient is improving in oral and written comprehension.  He is completing reading and listening exercises at home.  Patient demonstrates significant improvement in level of complexity of written information that he can comprehend.     Speech Therapy Frequency 2x / week   Duration Other (comment)   Treatment/Interventions Language facilitation;SLP instruction and feedback;Compensatory strategies;Patient/family education;Other (comment)   Potential to Achieve Goals Good   Potential Considerations Ability to learn/carryover information;Severity  of impairments;Previous level of function;Cooperation/participation level   SLP Home Exercise Plan Practice strategies across varied situations   Consulted and Agree with Plan of Care Patient      Patient will benefit from skilled therapeutic intervention in order to improve the following deficits and impairments:   Aphasia    Problem List Patient Active Problem List   Diagnosis Date Noted  . CVA (cerebral infarction) 08/21/2015  . TIA (transient ischemic attack) 08/20/2015  . Acute encephalopathy 08/20/2015   Leroy Sea, MS/CCC- SLP  Lou Miner 01/01/2016, 10:11 AM  [place] MAIN [facility] SERVICES [address] [place], [place], [address] Phone: [phone]   Fax:  [phone]   Name: Dalton Obrien MRN: [id number] Date of Birth: Jan 25, 1941

## 2016-01-03 ENCOUNTER — Encounter: Payer: Self-pay | Admitting: Speech Pathology

## 2016-01-03 ENCOUNTER — Ambulatory Visit: Payer: Medicare HMO | Admitting: Speech Pathology

## 2016-01-03 DIAGNOSIS — R4701 Aphasia: Secondary | ICD-10-CM

## 2016-01-03 NOTE — Therapy (Signed)
[place] MAIN [facility] SERVICES [address] [place], [place], [address] Phone: [phone]   Fax:  [phone]  Speech Language Pathology Treatment/ Discharge Summary  Patient Details  Name: Dalton Obrien MRN: [id number] Date of Birth: [birth year] Referring Provider: Gurney Maxin MD  Encounter Date: 01/03/2016      End of Session - 01/03/16 1059    Visit Number 32   Number of Visits 37   Date for SLP Re-Evaluation 01/15/16   SLP Start Time 0900   SLP Stop Time  1000   SLP Time Calculation (min) 60 min   Activity Tolerance Patient tolerated treatment well      Past Medical History  Diagnosis Date  . Asthma   . 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 No pertinent past surgical history.  There were no vitals filed for this visit.      Subjective Assessment - 01/03/16 1058    Subjective Dalton Obrien was alert and engaged throughout the session. He feels he is well prepared to continue his ADLs without ST. He uses many of the strategies provided to him to solve the problems he comes across during his day.   Patient is accompained by: Family member   Currently in Pain? No/denies               ADULT SLP TREATMENT - 01/03/16 0001    General Information   Behavior/Cognition Alert;Cooperative;Pleasant mood   Treatment Provided   Treatment provided Cognitive-Linquistic   Pain Assessment   Pain Assessment No/denies pain   Cognitive-Linquistic Treatment   Treatment focused on Aphasia   Skilled Treatment Dalton Obrien has achieved his goals regarding auditory comprehension and use of grammatical, cogent language. He has mastered many strategies including asking for repetitions, isolating important information, and writing down material he doesn't understand auditorily to compensate for his remaining deficits in processing speed. REASONING: Dalton Obrien was able to solve a Perplexor Level A independently early in the  session, and was unable to complete a similar puzzle even with maximum cues at the end of the session. VISUOSPATIAL: He was able to solve several organization puzzles with minimal cues from the clinician. AUDITORY COMPREHENSION: Dalton Obrien was able to isolate and repeat important info from complex sentences while a radio talk show played in the background with 80% accuracy with minimal cues from the clinician (repetitions).   Assessment / Recommendations / Plan   Plan Discharge SLP treatment due to (comment);All goals met          SLP Education - 01/03/16 1058    Education provided Yes   Education Details Auditory comprehension strategies for home use   Person(s) Educated Patient   Methods Explanation   Comprehension Verbalized understanding            SLP Long Term Goals - 01/03/16 1100    SLP LONG TERM GOAL #1   Title Patient will complete 2 unit processing tasks with 80% accuracy without the need of repetition of task instructions or significant delays in responding.   Status Achieved   SLP LONG TERM GOAL #2   Title Patient will complete 3 unit processing tasks with 80% accuracy without the need of repetition of task instructions or significant delays in responding.   Status Achieved   SLP LONG TERM GOAL #3   Title Patient will demonstrate reading comprehension for sentences with 80% accuracy.   Status Achieved   SLP LONG  TERM GOAL #4   Title Patient will demonstrate reading comprehension for paragraphs with 80% accuracy.   Status Achieved   SLP LONG TERM GOAL #5   Title Patient will generate grammatical and cogent sentence to complete abstract/complex linguistic task with 80% accuracy.   Status Achieved   SLP LONG TERM GOAL #6   Title Patient will demonstrate auditory comprehension for moderately complex and lengthy verbal stimuli with 80% accuracy.   Status Achieved          Plan - 01/03/16 1059    Clinical Impression Statement Dalton Obrien has made great progress in  ST. The strategies he has learned, combined with his confidence and environmental supports, will allow him to be successful going forward. The inconsistent performance occasionally seen in ST is likely due to his frustration at his remaining limitations, and not an indication of a deeper deficit.   Speech Therapy Frequency 2x / week   Duration Other (comment)   Treatment/Interventions Language facilitation;SLP instruction and feedback;Compensatory strategies;Patient/family education;Other (comment)   Potential to Achieve Goals Good   Potential Considerations Ability to learn/carryover information;Severity of impairments;Previous level of function;Cooperation/participation level   SLP Home Exercise Plan Practice strategies across varied situations   Consulted and Agree with Plan of Care Patient   Family Member Consulted spouse      Patient will benefit from skilled therapeutic intervention in order to improve the following deficits and impairments:   Aphasia    Problem List Patient Active Problem List   Diagnosis Date Noted  . CVA (cerebral infarction) 08/21/2015  . TIA (transient ischemic attack) 08/20/2015  . Acute encephalopathy 08/20/2015    Leroy Kennedy 01/03/2016, 11:01 AM  [place] MAIN [facility] SERVICES [address] [place], [place], [address] Phone: [phone]   Fax:  [phone]   Name: Dalton Obrien MRN: [id number] Date of Birth: 12-12-1940

## 2016-04-02 ENCOUNTER — Encounter: Payer: Self-pay | Admitting: *Deleted

## 2016-04-02 ENCOUNTER — Telehealth: Payer: Self-pay | Admitting: Family

## 2016-04-02 ENCOUNTER — Emergency Department: Payer: Medicare HMO

## 2016-04-02 ENCOUNTER — Emergency Department
Admission: EM | Admit: 2016-04-02 | Discharge: 2016-04-02 | Disposition: A | Discharge status: Home / Self Care | Payer: Medicare HMO | Attending: Emergency Medicine | Admitting: Emergency Medicine

## 2016-04-02 DIAGNOSIS — Z79899 Other long term (current) drug therapy: Secondary | ICD-10-CM | POA: Insufficient documentation

## 2016-04-02 DIAGNOSIS — Z87891 Personal history of nicotine dependence: Secondary | ICD-10-CM | POA: Diagnosis not present

## 2016-04-02 DIAGNOSIS — I509 Heart failure, unspecified: Secondary | ICD-10-CM | POA: Insufficient documentation

## 2016-04-02 DIAGNOSIS — J810 Acute pulmonary edema: Secondary | ICD-10-CM | POA: Insufficient documentation

## 2016-04-02 DIAGNOSIS — Z85828 Personal history of other malignant neoplasm of skin: Secondary | ICD-10-CM | POA: Diagnosis not present

## 2016-04-02 DIAGNOSIS — J45909 Unspecified asthma, uncomplicated: Secondary | ICD-10-CM | POA: Insufficient documentation

## 2016-04-02 DIAGNOSIS — R0602 Shortness of breath: Secondary | ICD-10-CM

## 2016-04-02 HISTORY — DX: Heart failure, unspecified: I50.9

## 2016-04-02 LAB — CBC WITH DIFFERENTIAL/PLATELET
BASOS PCT: 1 %
Basophils Absolute: 0.1 10*3/uL (ref 0–0.1)
EOS ABS: 0 10*3/uL (ref 0–0.7)
Eosinophils Relative: 1 %
HEMATOCRIT: 41.5 % (ref 40.0–52.0)
HEMOGLOBIN: 14.4 g/dL (ref 13.0–18.0)
LYMPHS ABS: 0.7 10*3/uL — AB (ref 1.0–3.6)
Lymphocytes Relative: 11 %
MCH: 33.9 pg (ref 26.0–34.0)
MCHC: 34.6 g/dL (ref 32.0–36.0)
MCV: 97.9 fL (ref 80.0–100.0)
MONO ABS: 0.5 10*3/uL (ref 0.2–1.0)
MONOS PCT: 7 %
Neutro Abs: 5.4 10*3/uL (ref 1.4–6.5)
Neutrophils Relative %: 80 %
Platelets: 154 10*3/uL (ref 150–440)
RBC: 4.24 MIL/uL — ABNORMAL LOW (ref 4.40–5.90)
RDW: 13.9 % (ref 11.5–14.5)
WBC: 6.7 10*3/uL (ref 3.8–10.6)

## 2016-04-02 LAB — COMPREHENSIVE METABOLIC PANEL
ALBUMIN: 4.1 g/dL (ref 3.5–5.0)
ALK PHOS: 53 U/L (ref 38–126)
ALT: 41 U/L (ref 17–63)
AST: 34 U/L (ref 15–41)
Anion gap: 8 (ref 5–15)
BILIRUBIN TOTAL: 1.6 mg/dL — AB (ref 0.3–1.2)
BUN: 25 mg/dL — AB (ref 6–20)
CO2: 19 mmol/L — ABNORMAL LOW (ref 22–32)
CREATININE: 1.1 mg/dL (ref 0.61–1.24)
Calcium: 8.7 mg/dL — ABNORMAL LOW (ref 8.9–10.3)
Chloride: 106 mmol/L (ref 101–111)
GFR calc Af Amer: >60 mL/min (ref >60)
GLUCOSE: 116 mg/dL — AB (ref 65–99)
Potassium: 4.4 mmol/L (ref 3.5–5.1)
Sodium: 133 mmol/L — ABNORMAL LOW (ref 135–145)
TOTAL PROTEIN: 7.1 g/dL (ref 6.5–8.1)

## 2016-04-02 LAB — FIBRIN DERIVATIVES D-DIMER (ARMC ONLY): FIBRIN DERIVATIVES D-DIMER (ARMC): 672 — AB (ref 0–499)

## 2016-04-02 LAB — TROPONIN I: Troponin I: 0.03 ng/mL (ref <0.03)

## 2016-04-02 LAB — BRAIN NATRIURETIC PEPTIDE: B NATRIURETIC PEPTIDE 5: 3237 pg/mL — AB (ref 0.0–100.0)

## 2016-04-02 MED ORDER — IPRATROPIUM-ALBUTEROL 0.5-2.5 (3) MG/3ML IN SOLN
3.0000 mL | Freq: Once | RESPIRATORY_TRACT | Status: AC
  Administered 2016-04-02: 3 mL via RESPIRATORY_TRACT
  Filled 2016-04-02: qty 3

## 2016-04-02 MED ORDER — FUROSEMIDE 10 MG/ML IJ SOLN
40.0000 mg | Freq: Once | INTRAMUSCULAR | Status: AC
  Administered 2016-04-02: 40 mg via INTRAVENOUS
  Filled 2016-04-02: qty 4

## 2016-04-02 MED ORDER — IOPAMIDOL (ISOVUE-370) INJECTION 76%
75.0000 mL | Freq: Once | INTRAVENOUS | Status: AC | PRN
  Administered 2016-04-02: 75 mL via INTRAVENOUS

## 2016-04-02 MED ORDER — ALBUTEROL SULFATE HFA 108 (90 BASE) MCG/ACT IN AERS: 2.0000 | INHALATION_SPRAY | Freq: Four times a day (QID) | RESPIRATORY_TRACT | 2 refills | Status: DC | PRN

## 2016-04-02 MED ORDER — FUROSEMIDE 20 MG PO TABS: 20.0000 mg | ORAL_TABLET | Freq: Two times a day (BID) | ORAL | 0 refills | Status: DC

## 2016-04-02 NOTE — ED Notes (Signed)
REDS VEST READING= 44 CHEST RULER=16  VEST FITTING TASKS: POSTURE=straight HEIGHT MARKER=t CENTER STRIP= aligned on spine  COMMENTS:approved by [employer]

## 2016-04-02 NOTE — ED Notes (Signed)
Patient returned from [place]. Placed back on monitor. Will continue to monitor patient.

## 2016-04-02 NOTE — ED Notes (Signed)
MD at bedside. 

## 2016-04-02 NOTE — Telephone Encounter (Signed)
Received phone call from [facility] ER staff that patient was being treated for exacerbation of HF and wanted to schedule a NP appointment with [place]. She said patient had an appointment next week with Dr. Marcello Moores from [facility] regarding possible AICD. Gave an appointment time on 04/08/16 and when ER staff checked with the patient and his wife, they said they did not want to make an appointment at this time and wanted to think about it. Asked the ER staff to give the patient our phone number so they could call when they were ready to make the appointment.

## 2016-04-02 NOTE — ED Triage Notes (Addendum)
Pt has been seen by several physicians including dr Ubaldo Glassing yesterday for shortness of breath, pt has more difficulty breathing while lying down, pt reports EF is 15%, pt is short of breath in triage

## 2016-04-02 NOTE — Discharge Instructions (Signed)
Please follow-up with your cardiologist for possible adjustments on future medication for the pulmonary edema. Return emergency Department for chest pain, increasing shortness of breath, fever, or any other new concerns.

## 2016-04-02 NOTE — ED Provider Notes (Signed)
Time Seen: Approximately [phone]  I have reviewed the triage notes  Chief Complaint: Shortness of Breath   History of Present Illness: Dalton Obrien is a 75 y.o. male who presents with a feeling of shortness of breath now for the last several months. He states it seems to be worse over the last week. Patient seen his pulmonologist and his cardiologist with no obvious known etiology. His cardiologist has stopped several of his medications at this point. He has a known history of a poor ejection fraction and there's been some discussion about pacemaker therapy. The patient denies any fever or productive nature to his cough and has been told that it seems to be bronchitis that she been relatively persistent. He denies any hemoptysis. His wife states last night he was particularly short of breath and they thought they could hear some wheezing at home. Not on home supplemental oxygen therapy. He states he feels a little bit improved from what he did earlier this morning.   Past Medical History:  Diagnosis Date  . Asthma   . Cancer ([place])    Basal Cell on Face    Patient Active Problem List   Diagnosis Date Noted  . CVA (cerebral infarction) 08/21/2015  . TIA (transient ischemic attack) 08/20/2015  . Acute encephalopathy 08/20/2015    History reviewed. No pertinent surgical history.  History reviewed. No pertinent surgical history.  Current Outpatient Rx  . Order #: [id number] Class: Historical Med  . Order #: [id number] Class: No Print  . Order #: [id number] Class: Historical Med  . Order #: [id number] Class: Historical Med    Allergies:  Penicillins and Pyridium [phenazopyridine hcl]  Family History: Family History  Problem Relation Age of Onset  . Stroke Mother   . Stroke Maternal Uncle   . Seizures Father   . Hypertension Brother     Social History: Social History  Substance Use Topics  . Smoking status: Former Research scientist (life sciences)  . Smokeless tobacco: Not on file     Comment: Quit in  1980  . Alcohol use 0.6 oz/week    1 Glasses of wine per week     Comment: 1-1.5 glasses of wine daily     Review of Systems:   10 point review of systems was performed and was otherwise negative:  Constitutional: No fever Eyes: No visual disturbances ENT: No sore throat, ear pain Cardiac: No chest pain Respiratory: Patient describes orthopnea and some shortness of breath with exertion. Abdomen: No abdominal pain, no vomiting, No diarrhea Endocrine: No weight loss, No night sweats Extremities: No peripheral edema, cyanosis Skin: No rashes, easy bruising Neurologic: No focal weakness, trouble with speech or swollowing Urologic: No dysuria, Hematuria, or urinary frequency   Physical Exam:  ED Triage Vitals  Enc Vitals Group     BP 04/02/16 0814 110/86     Pulse Rate 04/02/16 0814 92     Resp 04/02/16 0814 18     Temp 04/02/16 0814 97.5 F (36.4 C)     Temp src --      SpO2 04/02/16 0814 95 %     Weight 04/02/16 0818 182 lb (82.6 kg)     Height 04/02/16 0818 6\' 1"  (1.854 m)     Head Circumference --      Peak Flow --      Pain Score --      Pain Loc --      Pain Edu? --      Excl. in [place]? --  General: Awake , Alert , and Oriented times 3; GCS 15 No signs of respiratory distress. Head: Normal cephalic , atraumatic Eyes: Pupils equal , round, reactive to light Nose/Throat: No nasal drainage, patent upper airway without erythema or exudate.  Neck: Supple, Full range of motion, No anterior adenopathy or palpable thyroid masses Lungs: Limited auscultation at the bases with no obvious rales or rhonchi noted  Heart: Regular rate, irregular without murmurs , gallops , or rubs Abdomen: Soft, non tender without rebound, guarding , or rigidity; bowel sounds positive and symmetric in all 4 quadrants. No organomegaly .        Extremities: 2 plus symmetric pulses. No edema, clubbing or cyanosis Neurologic: normal ambulation, Motor symmetric without deficits, sensory  intact Skin: warm, dry, no rashes   Labs:   All laboratory work was reviewed including any pertinent negatives or positives listed below:  Labs Reviewed  COMPREHENSIVE METABOLIC PANEL - Abnormal; Notable for the following:       Result Value   Sodium 133 (*)    CO2 19 (*)    Glucose, Bld 116 (*)    BUN 25 (*)    Calcium 8.7 (*)    Total Bilirubin 1.6 (*)    All other components within normal limits  BRAIN NATRIURETIC PEPTIDE - Abnormal; Notable for the following:    B Natriuretic Peptide 3,237.0 (*)    All other components within normal limits  TROPONIN I - Abnormal; Notable for the following:    Troponin I 0.03 (*)    All other components within normal limits  CBC WITH DIFFERENTIAL/PLATELET - Abnormal; Notable for the following:    RBC 4.24 (*)    Lymphs Abs 0.7 (*)    All other components within normal limits  FIBRIN DERIVATIVES D-DIMER ([facility] ONLY) - Abnormal; Notable for the following:    Fibrin derivatives D-dimer ([facility]) 672 (*)    All other components within normal limits  Of significance is an elevated BNP along with a slightly elevated D-dimer test  EKG:  ED ECG REPORT I, Daymon Larsen, the attending physician, personally viewed and interpreted this ECG.  Date: 04/02/2016 EKG Time: 0818 Rate: 97 Rhythm: normal sinus rhythm with occasional PVCs unifocal QRS Axis: Left axis deviation Intervals: Left bundle branch block ST/T Wave abnormalities: normal Conduction Disturbances: none Narrative Interpretation: unremarkable No acute ischemic changes Left axis deviation, but otherwise no significant changes from previous EKG  Radiology: * "Dg Chest 2 View  Result Date: 04/02/2016 CLINICAL DATA:  Shortness of breath. EXAM: CHEST  2 VIEW COMPARISON:  None available currently. FINDINGS: Mild cardiomegaly is noted. Atherosclerosis of thoracic aorta is noted. No pneumothorax is noted. Bibasilar interstitial densities are note most consistent with pulmonary edema. Minimal  bilateral pleural effusions are noted as well. Bony thorax is unremarkable. IMPRESSION: Aortic atherosclerosis. Mild cardiomegaly is noted. Probable by and basilar pulmonary edema is noted with minimal associated pleural effusions. Electronically Signed   By: Marijo Conception, M.D.   On: 04/02/2016 09:16  "  I personally reviewed the radiologic studies    ED Course:  Patient's stay here was uneventful and he didn't locate was likely the source for his ongoing shortness of breath. Patient appears to have pulmonary edema with some pleural effusions and bilateral interstitial markings with an elevated BNP. His troponin is normal and I don't see any ischemic changes on his EKG and certainly this would go with his history of poor ejection fraction. The patient was referred back to  his cardiologist and was given IV Lasix here and will be discharged on Lasix for home. I felt he would do better with a rescue inhaler as far as his shortness of breath. Clinical Course     Assessment:  Pulmonary edema   Final Clinical Impression:   Final diagnoses:  Shortness of breath     Plan:  Outpatient " New Prescriptions   ALBUTEROL (PROVENTIL HFA;VENTOLIN HFA) 108 (90 BASE) MCG/ACT INHALER    Inhale 2 puffs into the lungs every 6 (six) hours as needed for wheezing or shortness of breath.   FUROSEMIDE (LASIX) 20 MG TABLET    Take 1 tablet (20 mg total) by mouth 2 (two) times daily.  " Patient was advised to return immediately if condition worsens. Patient was advised to follow up with their primary care physician or other specialized physicians involved in their outpatient care. The patient and/or family member/power of attorney had laboratory results reviewed at the bedside. All questions and concerns were addressed and appropriate discharge instructions were distributed by the nursing staff.            Daymon Larsen, MD 04/02/16 [phone]

## 2017-01-20 IMAGING — MR MR HEAD W/O CM
10 series · 48 of 48 positions shown · non-contrast
Comparison: Head CT and CTA 08/20/2015

CLINICAL DATA: Acute onset confusion and unsteadiness with
word-finding difficulty.

EXAM:
MRI HEAD WITHOUT CONTRAST
TECHNIQUE: Multiplanar, multiecho pulse sequences of the brain and surrounding
structures were obtained without intravenous contrast.

[Series 2: GRE · sagittal · 5.0mm · 0.45mm/px · 4 of 25 slices shown (1 of 2)]
[im 1/25]
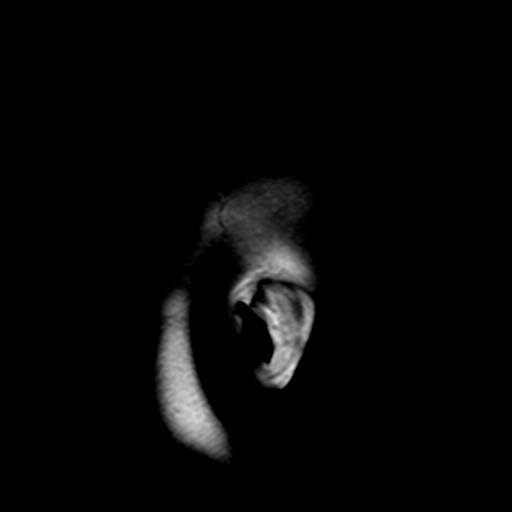
[im 9/25]
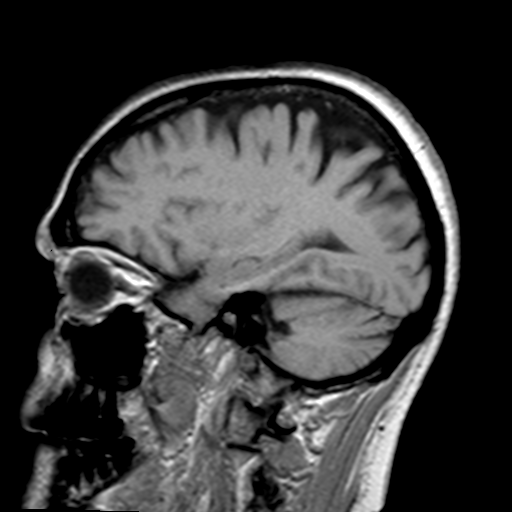
[im 17/25]
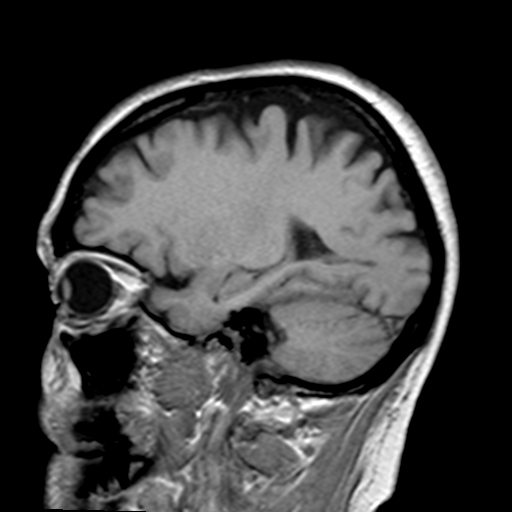
[im 25/25]
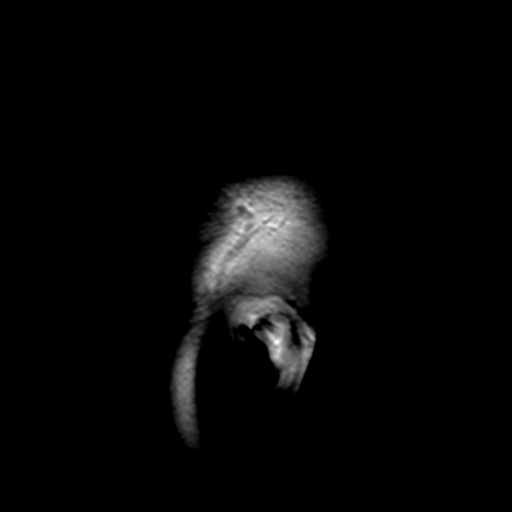

[Series 4: DWI · axial · 3.0mm · 1.80mm/px · z∈[-35,+133]mm · 8 of 55 slices shown (1 of 4)]
[im 1/55]
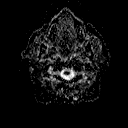
[im 8/55]
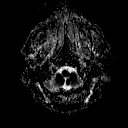
[im 16/55]
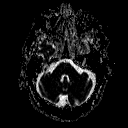
[im 24/55]
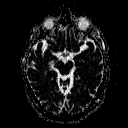
[im 31/55]
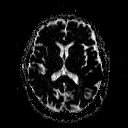
[im 39/55]
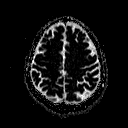
[im 47/55]
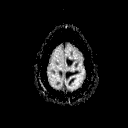
[im 55/55]
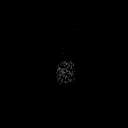

[Series 6: DWI · coronal · 3.0mm · 1.80mm/px · 7 of 51 slices shown (2 of 4)]
[im 1/51]
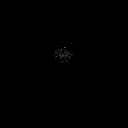
[im 9/51]
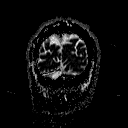
[im 17/51]
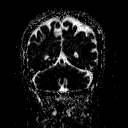
[im 26/51]
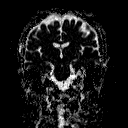
[im 34/51]
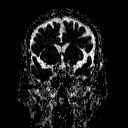
[im 42/51]
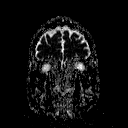
[im 51/51]
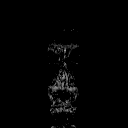

[Series 7: T2 · axial · 5.0mm · 0.45mm/px · z∈[-39,+130]mm · 3 of 27 slices shown (1 of 3)]
[im 1/27]
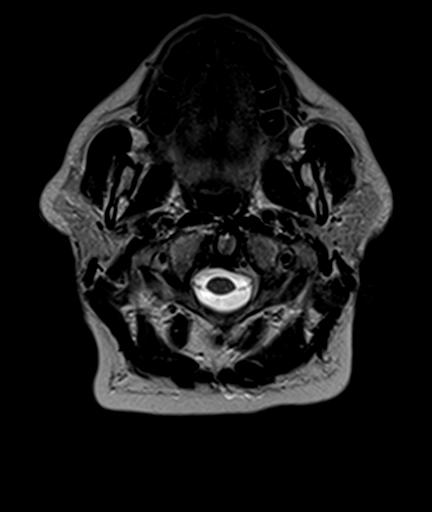
[im 14/27]
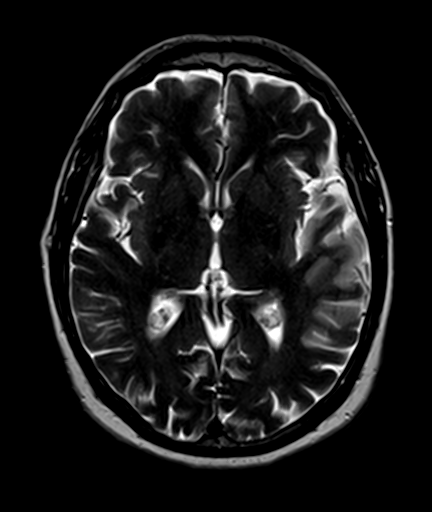
[im 27/27]
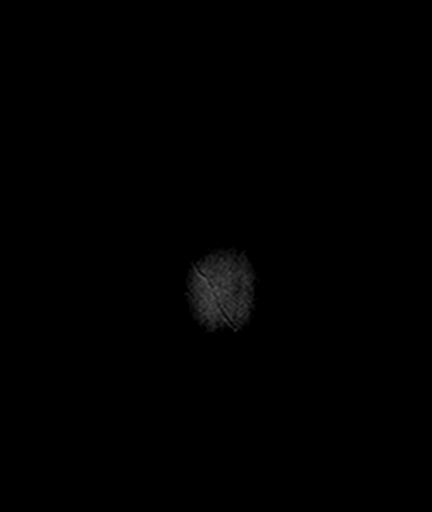

[Series 8: FLAIR · axial · 5.0mm · 0.45mm/px · z∈[-39,+130]mm · 3 of 27 slices shown]
[im 1/27]
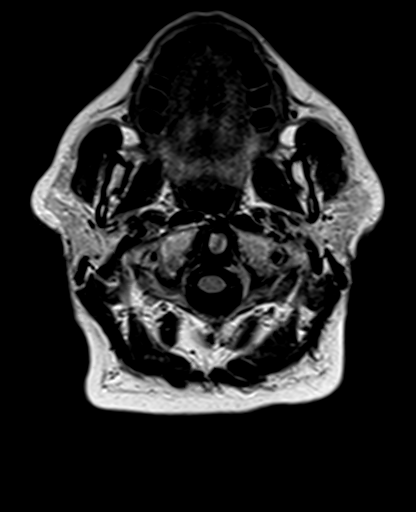
[im 14/27]
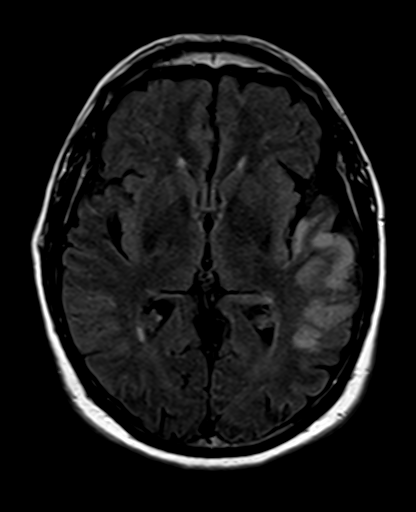
[im 27/27]
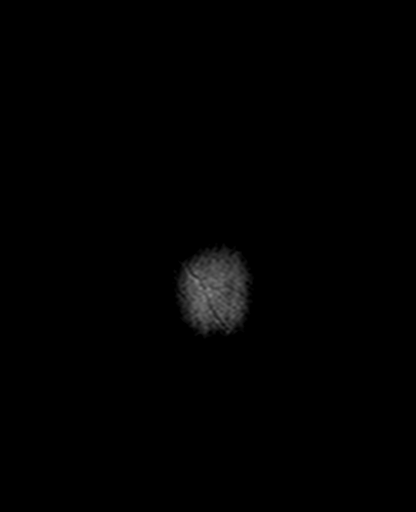

[Series 9: T2 · axial · 5.0mm · 1.20mm/px · z∈[-39,+129]mm · 3 of 27 slices shown (2 of 3)]
[im 1/27]
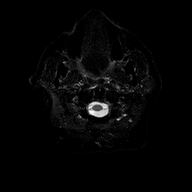
[im 14/27]
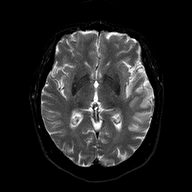
[im 27/27]
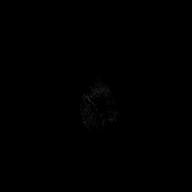

[Series 10: GRE · axial · 5.0mm · 0.45mm/px · z∈[-39,+129]mm · 3 of 27 slices shown (2 of 2)]
[im 1/27]
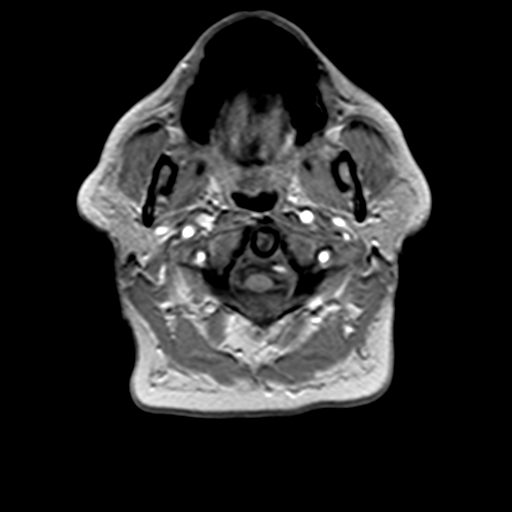
[im 14/27]
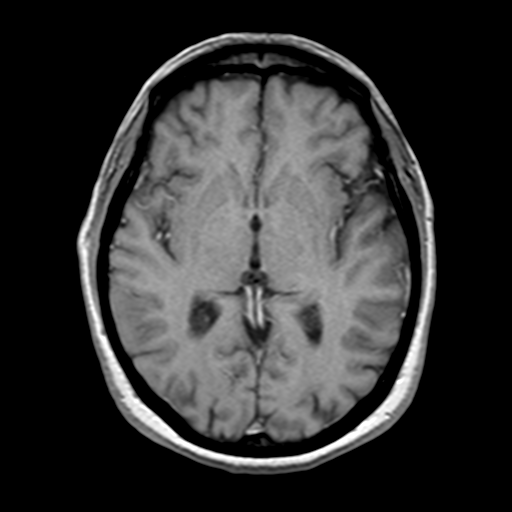
[im 27/27]
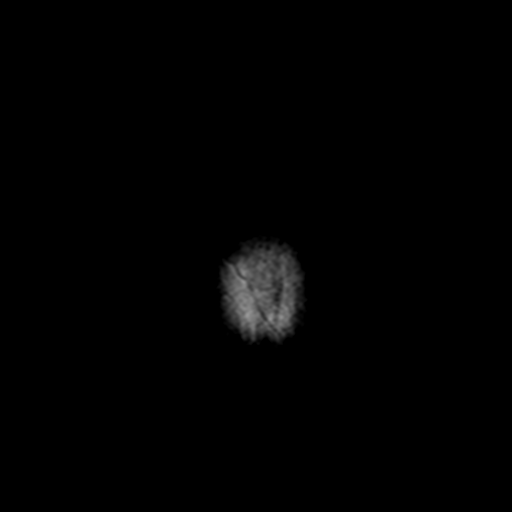

[Series 11: T2 · coronal · 5.0mm · 0.45mm/px · 4 of 30 slices shown (3 of 3)]
[im 1/30]
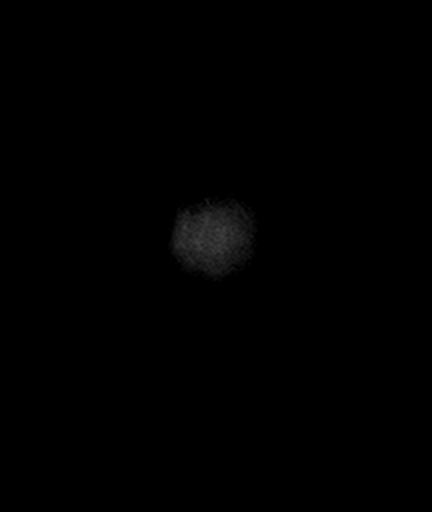
[im 10/30]
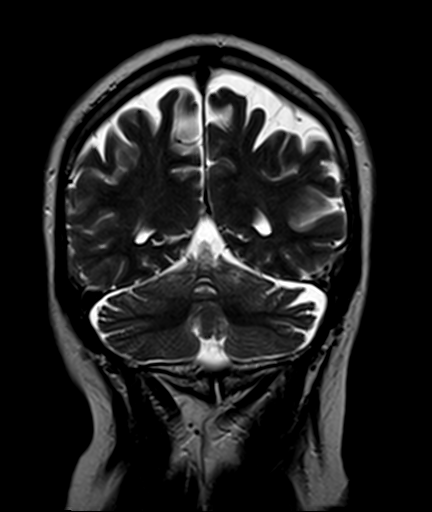
[im 20/30]
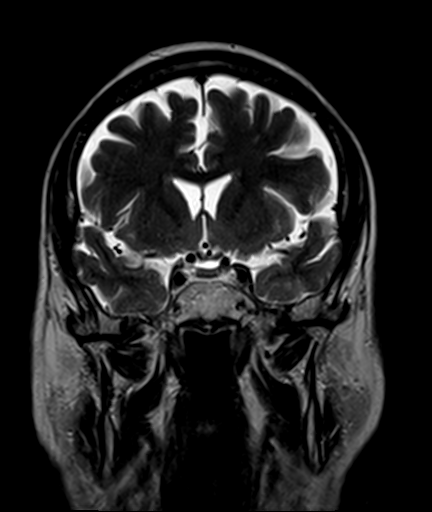
[im 30/30]
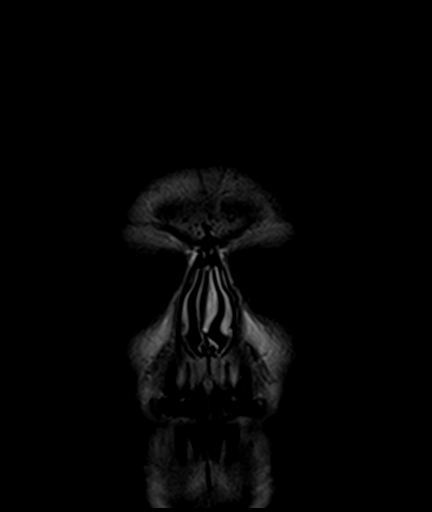

[Series 100: DWI · axial · 3.0mm · 1.80mm/px · z∈[-35,+133]mm · 7 of 57 slices shown (3 of 4)]
[im 1/57]
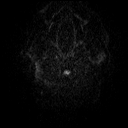
[im 10/57]
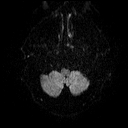
[im 19/57]
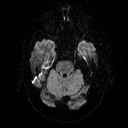
[im 29/57]
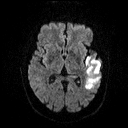
[im 38/57]
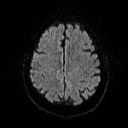
[im 47/57]
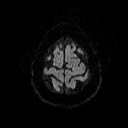
[im 57/57]
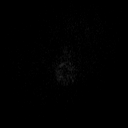

[Series 101: DWI · coronal · 3.0mm · 1.80mm/px · 6 of 51 slices shown (4 of 4)]
[im 1/51]
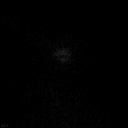
[im 11/51]
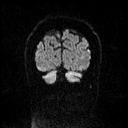
[im 21/51]
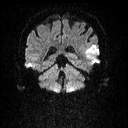
[im 31/51]
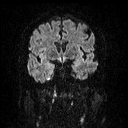
[im 41/51]
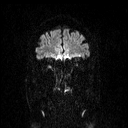
[im 51/51]
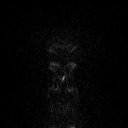

[48 of 48 positions shown; findings below may reference images not displayed]

FINDINGS: There is a moderate-sized acute left MCA territory infarct involving
the superior temporal lobe and inferior parietal lobe including the
posterior aspect of the operculum. This measures approximately 6 x 3
cm. A punctate focus of acute cortical infarction is noted more
posteriorly in the left parieto-occipital region. There is
associated cytotoxic edema without significant mass effect. No
intracranial hemorrhage, mass, midline shift, or extra-axial fluid
collection is seen.

Ventricles and sulci are normal in size for age. Scattered, punctate
foci of T2 hyperintensity in the cerebral white matter bilaterally
are nonspecific but compatible with minimal chronic small vessel
ischemic disease, less than is often seen in patients of this age.

Orbits are unremarkable. Minimal left maxillary sinus and minimal
left ethmoid air cell mucosal thickening is noted. The mastoid air
cells are clear. Major intracranial vascular flow voids are
preserved.
IMPRESSION: Moderate sized left temporoparietal MCA infarct.

## 2017-09-02 IMAGING — CR DG CHEST 2V
1 series · 2 of 2 positions shown · non-contrast
Comparison: None available currently.

CLINICAL DATA: Shortness of breath.

EXAM:
CHEST  2 VIEW

[Series 1: w chest pa · 0.14mm/px · 2 of 2 slices shown]
[im 1/2]
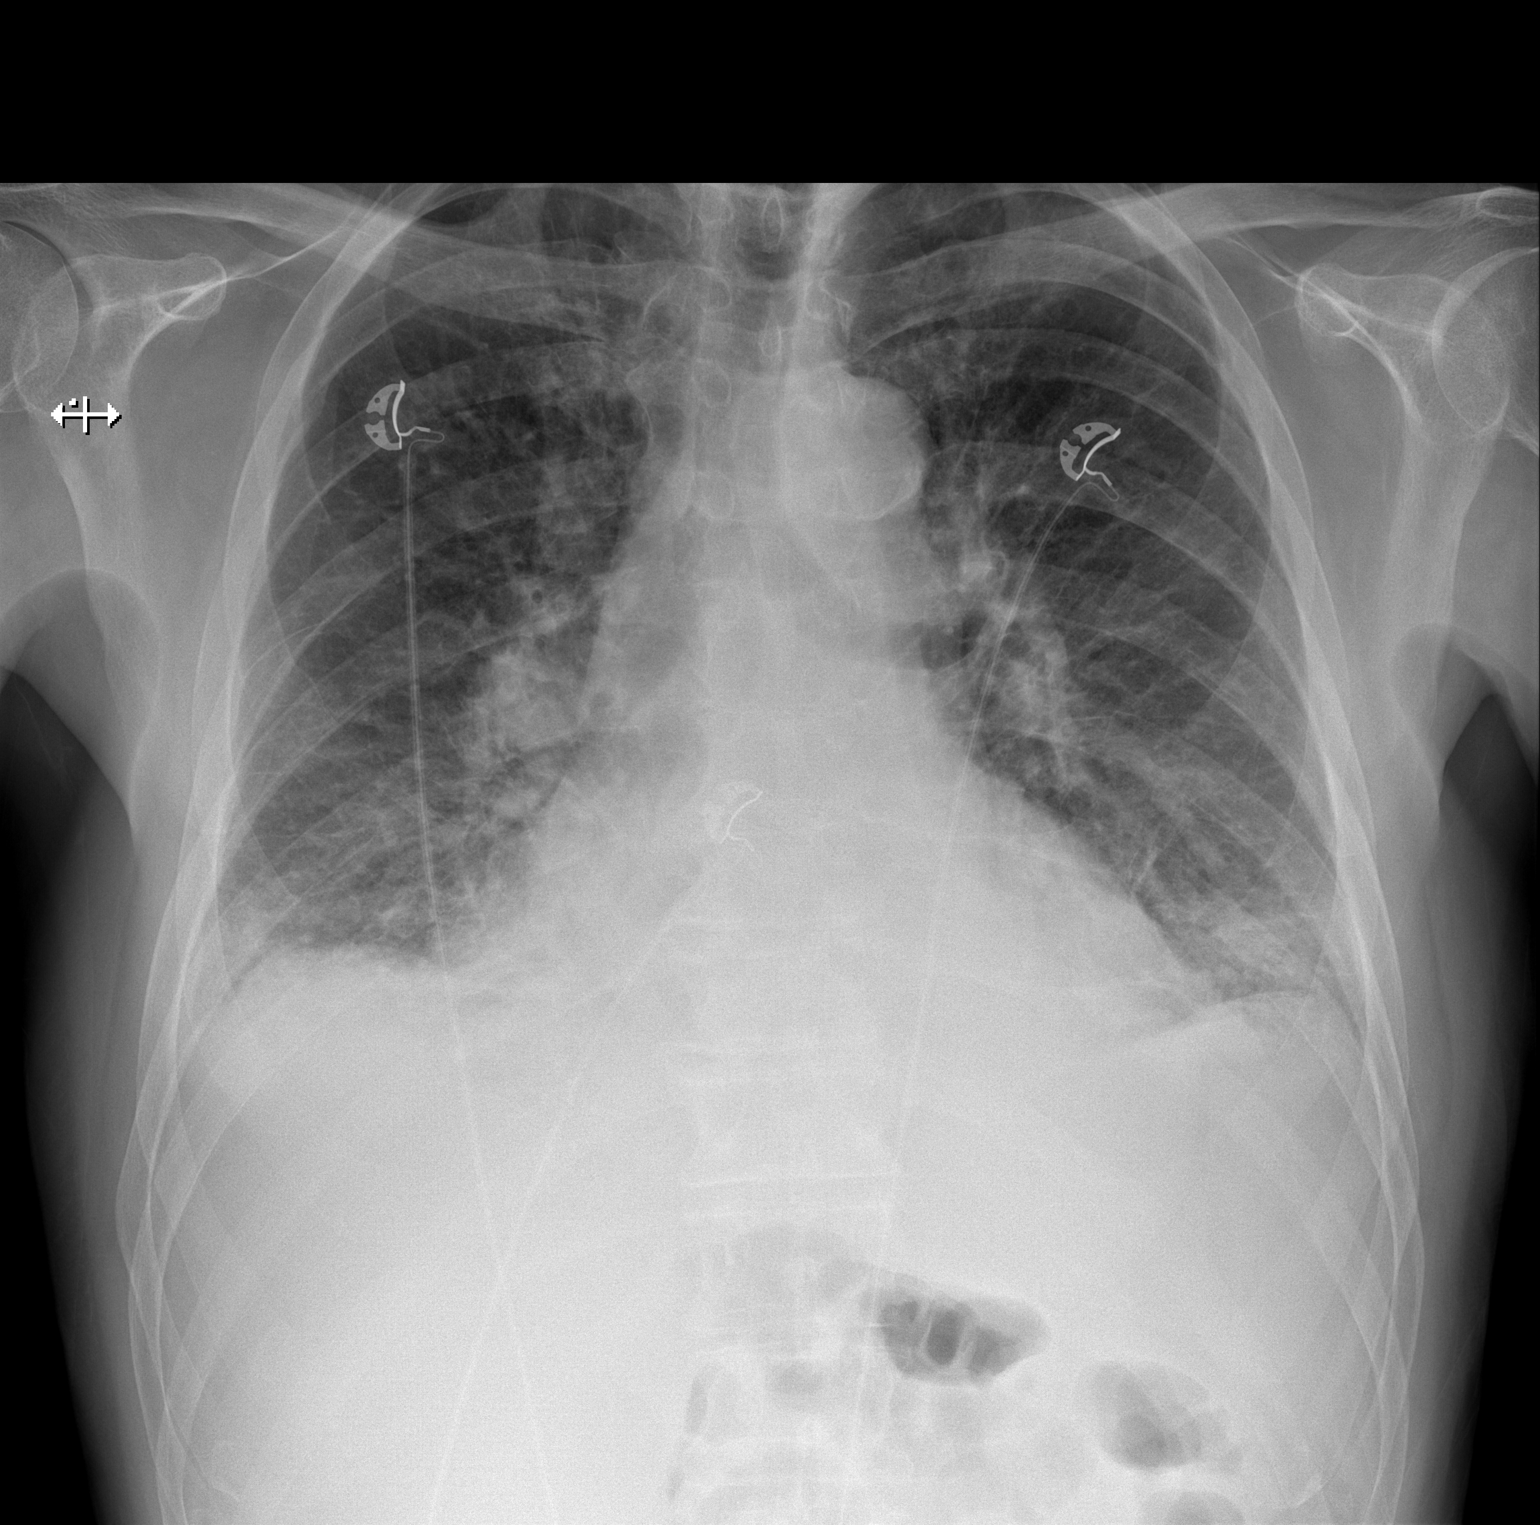
[im 2/2]
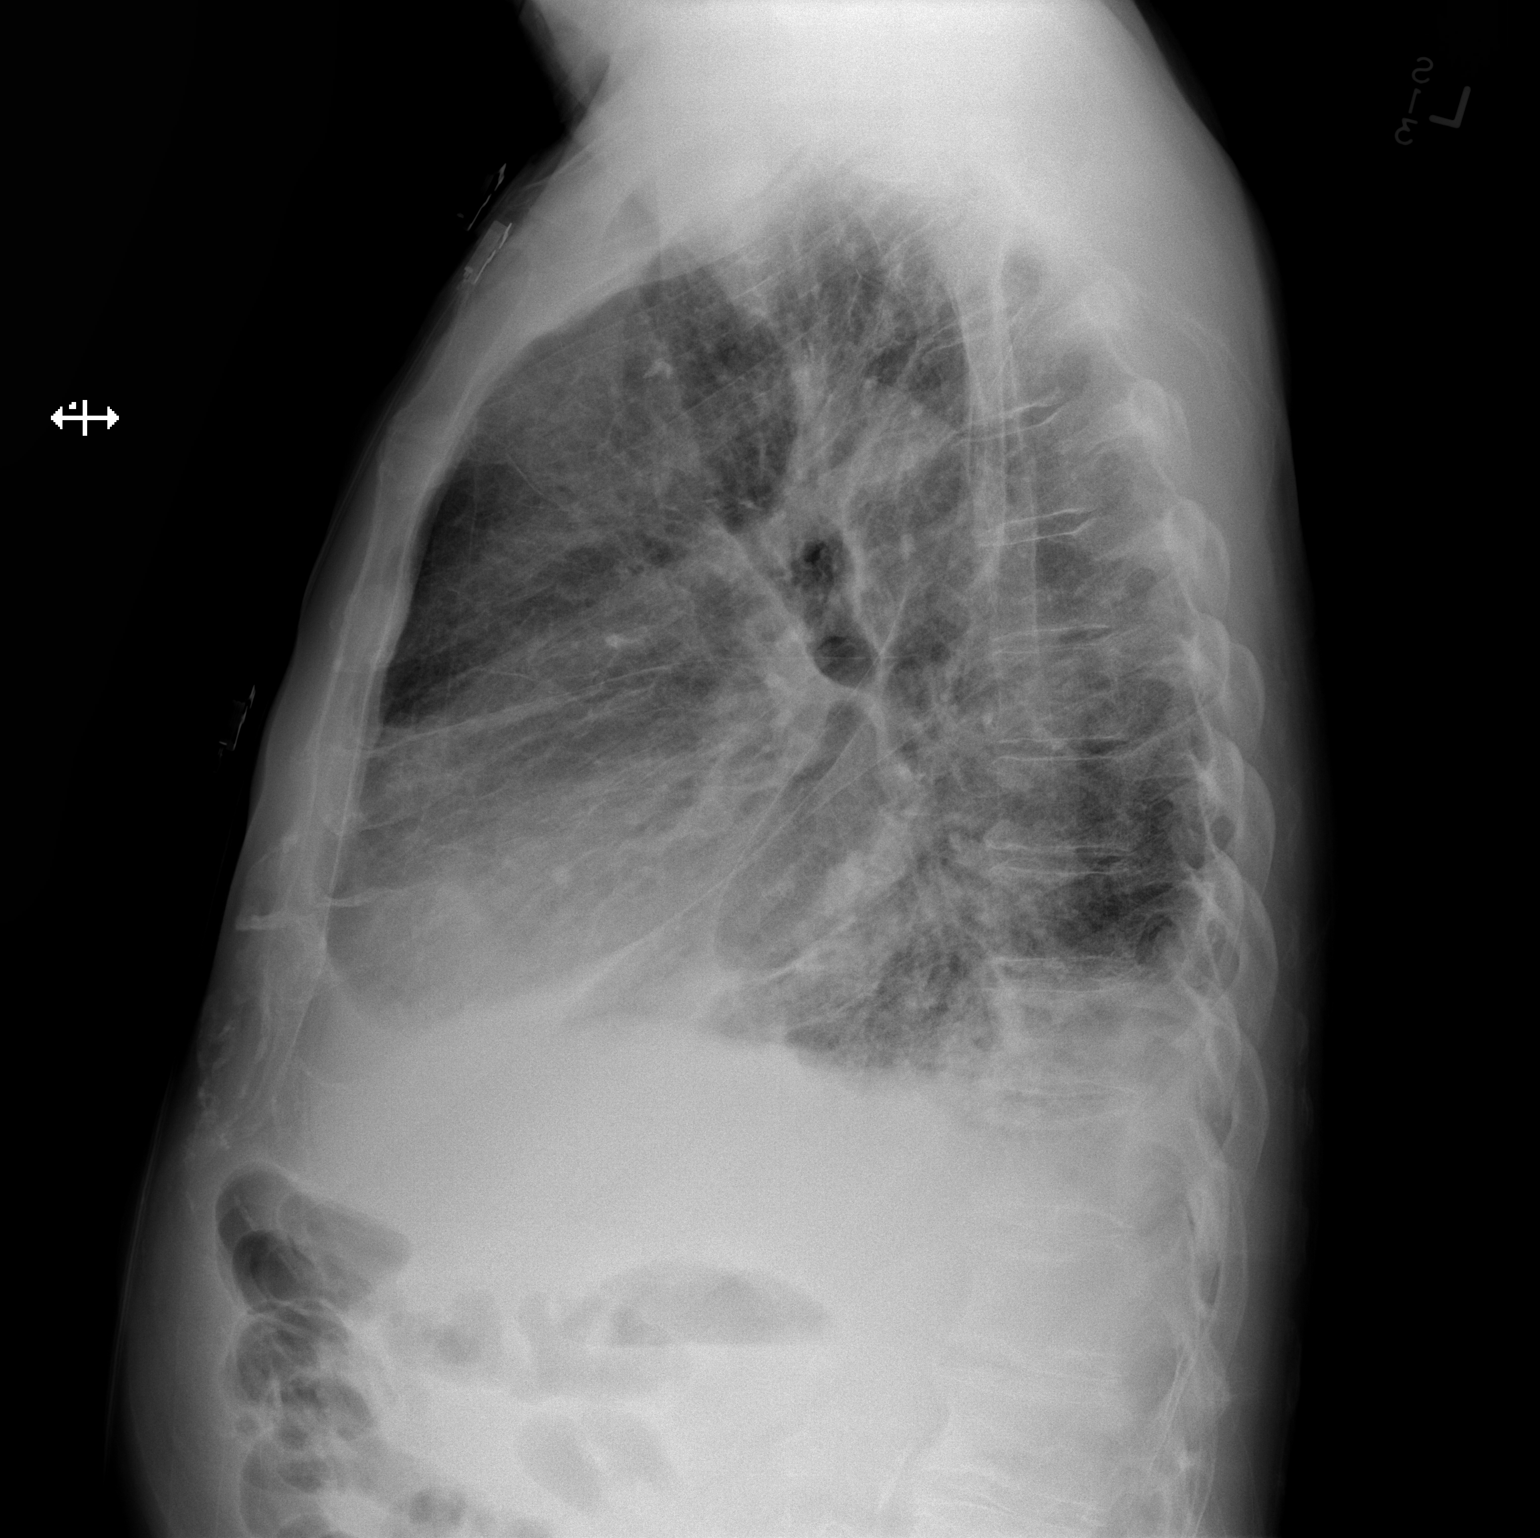

[2 of 2 positions shown; findings below may reference images not displayed]

FINDINGS: Mild cardiomegaly is noted. Atherosclerosis of thoracic aorta is
noted. No pneumothorax is noted. Bibasilar interstitial densities
are note most consistent with pulmonary edema. Minimal bilateral
pleural effusions are noted as well. Bony thorax is unremarkable.
IMPRESSION: Aortic atherosclerosis. Mild cardiomegaly is noted. Probable by and
basilar pulmonary edema is noted with minimal associated pleural
effusions.

## 2019-03-08 ENCOUNTER — Ambulatory Visit: Payer: Medicare HMO | Attending: Student | Admitting: Physical Therapy

## 2019-03-08 ENCOUNTER — Other Ambulatory Visit: Payer: Self-pay | Admitting: Physical Medicine and Rehabilitation

## 2019-03-08 ENCOUNTER — Encounter: Payer: Self-pay | Admitting: Physical Therapy

## 2019-03-08 ENCOUNTER — Other Ambulatory Visit: Payer: Self-pay

## 2019-03-08 DIAGNOSIS — M5441 Lumbago with sciatica, right side: Secondary | ICD-10-CM | POA: Insufficient documentation

## 2019-03-08 DIAGNOSIS — M5442 Lumbago with sciatica, left side: Secondary | ICD-10-CM | POA: Diagnosis present

## 2019-03-08 DIAGNOSIS — M5416 Radiculopathy, lumbar region: Secondary | ICD-10-CM

## 2019-03-08 NOTE — Therapy (Signed)
[facility] [address] [place], [address] Phone: [phone]   Fax:  [phone]  Physical Therapy Evaluation  Patient Details  Name: Dalton Obrien MRN: [id number] Date of Birth: [birth year] No data recorded  Encounter Date: 03/08/2019  PT End of Session - 03/08/19 1646    Visit Number  1    Number of Visits  17    Date for PT Re-Evaluation  05/03/19    PT Start Time  0315    PT Stop Time  0422    PT Time Calculation (min)  67 min    Activity Tolerance  Patient tolerated treatment well    Behavior During Therapy  [facility] for tasks assessed/performed       Past Medical History:  Diagnosis Date  . Asthma   . Cancer ([facility])    Basal Cell on Face  . CHF (congestive heart failure) ([place])   . 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 No pertinent surgical history.  There were no vitals filed for this visit.   Subjective Assessment - 03/08/19 1524    Pertinent History  Pt is a 78 year old male with LBP over the past 2 weeks. Reports he usually plays golf 9 holes 1x a day, and reports 3 weeks ago he played 18 holes one evening and again in the am and he has had pain gettin gout of bed ever since. Reports pain is limiting him in bed mobility, and STS transfers. Reports he as been taking pain medication that has made him constipated, which is making his pain worse, because he is bearing down harder when he uses the bathroom. Hx of bladder cancer with radiation and chemo treatment last fall following tumor removal, with some continued sensitivity to lumbar area. Reports pain is bilat LB, with sharp pain in R buttock and hamstring. Denies numbness/tingling sensation with shooting sensation down post RLE. Enjoys golfing, walking on course for 2.5 miles, pulling his cart. Reports worst pain over the past week 9/10 best 0/10. Pain is made worse by swinging golf club,  prolonged walking, STS, and bed mobility. Pt denies N/V, B&B changes, unexplained weight fluctuation, saddle paresthesia, fever, night sweats, or unrelenting night pain at this time. Important to note patient has expressive and receptive aphasia from Aug 20 2015    Limitations  Lifting;Walking;House hold activities    How long can you sit comfortably?  unlimited    How long can you stand comfortably?  unlimited    How long can you walk comfortably?  3/4 mile    Diagnostic tests  CT scan scheduled 9/15    Patient Stated Goals  decrease pain to play golf    Currently in Pain?  Yes    Pain Location  Back    Pain Orientation  Right;Left;Lower    Pain Descriptors / Indicators  Aching;Dull;Sharp    Pain Type  Acute pain    Pain Radiating Towards  R glute, and hamstring    Pain Onset  More than a month ago    Pain Frequency  Intermittent    Aggravating Factors   STS, bed mobility, walking, golf swing    Pain Relieving Factors  sitting in his recliner with lumbar support    Effect of Pain on Daily Activities  unable to play gold, complete ADLs       OBJECTIVE  Mental Status Patient is oriented to  person, place and time.  Recent memory is intact.  Remote memory is intact.  Attention span and concentration are intact.  Expressive speech is intact.  Patient's fund of knowledge is within normal limits for educational level.  SENSATION: Grossly intact to light touch bilateral LEs as determined by testing dermatomes L2-S2 Proprioception and hot/cold testing deferred on this date   MUSCULOSKELETAL: Tremor: None Bulk: Normal Tone: Normal   Posture Forward head, rounded shoulder, decreased lumbar lordosis, decreased TKE bilat  Gait Decreased lumbar rotation, and TKE bilat, decreased heel strike bilat    Palpation  Increased muscle tension bilat hamstrings with muscle spasm. No pain to palpation at bilat glut or lumbar spine musculature  Strength (out of 5) R/L 5/5 Hip flexion 4/4  Hip ER 5/5 Hip IR 4+/4+ Hip abduction 5/5 Hip adduction 4/4 Hip extension 3/3 Knee extension (does not have full knee ext ROM) 5/5 Knee flexion 5/5 Ankle dorsiflexion *Indicates pain   AROM (degrees) R/L (all movements include overpressure unless otherwise stated) Lumbar forward flexion (0-65): 50% limited Lumbar extension (0-30): 25% limited Lumbar lateral flexion (0-25): WNL bilat Lumbar rotation: 50% limited bilat Hip IR (0-45): 50% limited bilat All other hip motions WNL *Indicates pain  PROM (degrees) PROM = AROM  Repeated Movements Centralization/pain relief with prone laying, repeated ext with better ease of STS transfer following   Muscle Length Hamstrings: About 90d bilat [place]: Positive bilat [place]: Unable  [place]: Negative bilat   Passive Accessory Intervertebral Motion (PAIVM) Pt denies reproduction of back pain with CPA L1-L5 and UPA bilaterally L1-L5. Generally hypomobile throughout  Passive Physiological Intervertebral Motion (PPIVM) Normal flexion and extension with PPIVM testing   SPECIAL TESTS Slump: R:Positive L: Negative  SLR: Unable to complete d/t hamstring tension  FABER: Positive bilat FADIR: Negative bilat  Hip scour: Negative bilat  Ther-Ex Prone prop on elbows (8mins for HEP) Repeated ext x10 (every hour for HEP) Sciatic Nerve glide x10 each LE (each hour for HEP) Education on nerve irritation and muscle tension, and muscle tensions affect on LE mechanics with carry over into gait and fucntional tasks; verbalized understanding.                          Objective measurements completed on examination: See above findings.              PT Education - 03/08/19 1543    Education Details  Patient was educated on diagnosis, anatomy and pathology involved, prognosis, role of PT, and was given an HEP, demonstrating exercise with proper form following verbal and tactile cues, and was given a paper hand out to  continue exercise at home. Pt was educated on and agreed to plan of care.    Person(s) Educated  Patient    Methods  Explanation;Demonstration;Tactile cues;Verbal cues;Handout    Comprehension  Verbalized understanding;Returned demonstration;Verbal cues required;Tactile cues required;Need further instruction       PT Short Term Goals - 03/08/19 1646      PT SHORT TERM GOAL #1   Title  Pt will be independent with HEP in order to improve strength and decrease back pain in order to improve pain-free function at home and work.    Baseline  03/08/19 HEP given    Time  4    Period  Weeks    Status  New        PT Long Term Goals - 03/08/19 1647      PT LONG TERM  GOAL #1   Title  Pt will decrease mODI scoreby at least 13 points in order demonstrate clinically significant reduction in back pain/disability    Baseline  03/08/19 23    Time  8    Period  Weeks    Status  New      PT LONG TERM GOAL #2   Title  Pt will decrease worst back pain as reported on NPRS by at least 2 points in order to demonstrate clinically significant reduction in back pain.    Baseline  03/08/19 9/10 pain with all transitional movements    Time  8    Period  Weeks    Status  New      PT LONG TERM GOAL #3   Title  Patient will be able to stand from standard chair with proper squat technique to demonstrate full LE ROM, strength, and safety completing transfer in the community    Baseline  03/08/19 pushes from armrest into a pelvise forward position, uses momentum to bring torso forward    Time  [place] - 03/09/19 [id number]    Clinical Impression Statement  Patient is a 78 year male with bilat LBP with possible associated sciatica symptoms RLE>LLE. Patient with impairments in LE ROM, core strength, trunk mobility, and pain. Activiy limitations in sqautting, bending, prolonged standing, and walking; inhibiting full participation in his job at [employer] and his hobby of  running. Patient will benefit from skilled PT to address impairments to return to PLOF, optimal QOL.    Personal Factors and Comorbidities  Comorbidity 1;Comorbidity 2;Behavior Pattern;Fitness    Comorbidities  Anxiety, depression    Examination-Activity Limitations  Bed Mobility;Bend;Lift;Squat;Locomotion Level;Stand    Stability/Clinical Decision Making  Evolving/Moderate complexity    Clinical Decision Making  Moderate    Rehab Potential  Good    PT Frequency  2x / week    PT Duration  8 weeks    PT Treatment/Interventions  Moist Heat;Therapeutic activities;Taping;Spinal Manipulations;Passive range of motion;Manual techniques;Patient/family education;Therapeutic exercise;Gait training;Cryotherapy;Traction;Electrical Stimulation;Ultrasound;Functional mobility training;Neuromuscular re-education;Dry needling    PT Next Visit Plan  HEP review    PT Home Exercise Plan  prone prop, repeated ext, sciatic nerve glide    Consulted and Agree with Plan of Care  Patient       Patient will benefit from skilled therapeutic intervention in order to improve the following deficits and impairments:  Postural dysfunction, Pain, Impaired flexibility, Increased fascial restricitons, Decreased activity tolerance, Decreased range of motion, Improper body mechanics, Decreased endurance, Decreased mobility, Hypomobility, Increased muscle spasms, Impaired sensation  Visit Diagnosis: Acute bilateral low back pain with bilateral sciatica     Problem List Patient Active Problem List   Diagnosis Date Noted  . CVA (cerebral infarction) 08/21/2015  . TIA (transient ischemic attack) 08/20/2015  . Acute encephalopathy 08/20/2015   Shelton Silvas PT, DPT Shelton Silvas 03/09/2019, 10:05 AM  [facility] [address], [place], [address] Phone: [phone]   Fax:  [phone]  Name: Dalton Obrien MRN: [id number] Date of Birth:  Jul 19, 1940

## 2019-03-15 ENCOUNTER — Ambulatory Visit: Payer: Medicare HMO | Admitting: Physical Therapy

## 2019-03-16 ENCOUNTER — Other Ambulatory Visit: Payer: Self-pay

## 2019-03-16 ENCOUNTER — Ambulatory Visit
Admission: RE | Admit: 2019-03-16 | Discharge: 2019-03-16 | Disposition: A | Discharge status: Home / Self Care | Payer: Medicare HMO | Source: Ambulatory Visit | Attending: Physical Medicine and Rehabilitation | Admitting: Physical Medicine and Rehabilitation

## 2019-03-16 ENCOUNTER — Ambulatory Visit: Admission: RE | Admit: 2019-03-16 | Payer: Medicare HMO | Source: Ambulatory Visit

## 2019-03-16 DIAGNOSIS — M5416 Radiculopathy, lumbar region: Secondary | ICD-10-CM | POA: Diagnosis not present

## 2019-03-17 ENCOUNTER — Encounter: Payer: Medicare HMO | Admitting: Physical Therapy

## 2019-03-20 ENCOUNTER — Encounter: Payer: Medicare HMO | Admitting: Physical Therapy

## 2019-03-21 ENCOUNTER — Ambulatory Visit: Payer: Medicare HMO

## 2019-03-22 ENCOUNTER — Encounter: Payer: Medicare HMO | Admitting: Physical Therapy

## 2019-03-24 ENCOUNTER — Encounter: Payer: Medicare HMO | Admitting: Physical Therapy

## 2019-03-27 ENCOUNTER — Encounter: Payer: Medicare HMO | Admitting: Physical Therapy

## 2019-03-29 ENCOUNTER — Encounter: Payer: Medicare HMO | Admitting: Physical Therapy

## 2019-03-31 ENCOUNTER — Encounter: Payer: Medicare HMO | Admitting: Physical Therapy

## 2019-04-03 ENCOUNTER — Encounter: Payer: Medicare HMO | Admitting: Physical Therapy

## 2019-04-05 ENCOUNTER — Encounter: Payer: Medicare HMO | Admitting: Physical Therapy

## 2019-04-07 ENCOUNTER — Encounter: Payer: Medicare HMO | Admitting: Physical Therapy

## 2019-04-12 ENCOUNTER — Other Ambulatory Visit: Payer: Self-pay

## 2019-04-12 ENCOUNTER — Ambulatory Visit: Payer: Medicare HMO | Attending: Student

## 2019-04-12 DIAGNOSIS — M5442 Lumbago with sciatica, left side: Secondary | ICD-10-CM

## 2019-04-12 DIAGNOSIS — M5441 Lumbago with sciatica, right side: Secondary | ICD-10-CM | POA: Diagnosis present

## 2019-04-12 NOTE — Therapy (Signed)
[facility] [address], [place], [address] Phone: [phone]   Fax:  [phone]  Physical Therapy Treatment  Patient Details  Name: Dalton Obrien MRN: [id number] Date of Birth: August 11, 1940 Referring Provider (PT): Wayland Denis PA-C    Encounter Date: 04/12/2019  PT End of Session - 04/12/19 1000    Visit Number  2    Number of Visits  17    Date for PT Re-Evaluation  05/03/19    PT Start Time  0845    PT Stop Time  0931    PT Time Calculation (min)  46 min    Activity Tolerance  Patient tolerated treatment well    Behavior During Therapy  [facility] for tasks assessed/performed       Past Medical History:  Diagnosis Date   Asthma    Cancer ([place])    Basal Cell on Face   CHF (congestive heart failure) ([place])    Infiltrative basal cell 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  There were no vitals filed for this visit.  Subjective Assessment - 04/12/19 0951    Subjective  Pt returns to PT for 2nd visit after 5 weeks, reporting he had significant pain after evaluation visit and inability to perform HEP as prescribed. Pt opted to delay addiitonal visits until having imaging done. He reports ongoign improvements, but continues to have movement anxiety and limitations in his golf swing mobility basics in transitions from sitting to supine to prone.    Pertinent History  Pt is a 78 year old male with LBP over the past 2 weeks. Reports he usually plays golf 9 holes 1x a day, and reports 3 weeks ago he played 18 holes one evening and again in the am and he has had pain gettin gout of bed ever since. Reports pain is limiting him in bed mobility, and STS transfers. Reports he as been taking pain medication that has made him constipated, which is making his pain worse, because he is bearing down harder when he uses the bathroom. Hx of bladder cancer with  radiation and chemo treatment last fall following tumor removal, with some continued sensitivity to lumbar area. Reports pain is bilat LB, with sharp pain in R buttock and hamstring. Denies numbness/tingling sensation with shooting sensation down post RLE. Enjoys golfing, walking on course for 2.5 miles, pulling his cart. Reports worst pain over the past week 9/10 best 0/10. Pain is made worse by swinging golf club, prolonged walking, STS, and bed mobility. Pt denies N/V, B&B changes, unexplained weight fluctuation, saddle paresthesia, fever, night sweats, or unrelenting night pain at this time. Important to note patient has expressive and receptive aphasia from Aug 20 2015    Limitations  Lifting;Walking;House hold activities    How long can you sit comfortably?  unlimited: unlimited at evaluation    How long can you stand comfortably?  unlimited: unlimited at evaluation    How long can you walk comfortably?  1 hour; 3/4 mile 1 month ago    Diagnostic tests  CT scan scheduled 9/15    Patient Stated Goals  decrease pain to play golf    Currently in Pain?  Yes    Pain Orientation  Left    Pain Descriptors / Indicators  Aching   0.5/10 near Left L5/S1 facet area        [facility] PT Assessment - 04/12/19  0001      Assessment   Medical Diagnosis  acute onset sciatica     Referring Provider (PT)  Wayland Denis PA-C     Onset Date/Surgical Date  --   August 2020     Balance Screen   Has the patient fallen in the past 6 months  No    Has the patient had a decrease in activity level because of a fear of falling?   No    Is the patient reluctant to leave their home because of a fear of falling?   No      Prior Function   Level of Independence  Independent    Vocation  Retired    Designer, industrial/product   Five time sit to stand comments   17.18sec   mild guarding of movement      INTERVENTION THIS DATE:  -5xSTS: 17.18sec MMT- all pain free and 5/5 Hip flexion, hip  horizontal ABDCT, hip horizontal ADD, hip ER, Hip IR Knee flexion/extension (lacks TKE ROM d/t hamstrings tightness) Ankle DF  Prone hip extension ROM to <10 degrees bilat, strength 3/5 bilat   -Light touch sensation intact BLE  -Lumbar spine mobility/PAIVM: L1-L3 rigid kyphosis with low muscle mass, pain at L3 central PA; L4 and L5 slight hypermobility, no pain.  -unilateral PA to L5/S1 tender left, painful right, both improve with time  Palpation: tightness and pain in bilat lumbar parapsinals, particularly the right side.   Central PA L4: 2x30sec Grade 3 Uni PA: L5/S1 bilat 2x30sec Grade 3  MFR to right paraspinals in lumbar area  Bridge 1x12 Hooklying lower trunk rotations 5x10sec bilat to point of tenderness only Prone SLR 1x10 bilat   -Education on HEP feedback and scheduling consistency -Education on log roll technique to be revisisted in person next visit.               PT Short Term Goals - 03/08/19 1646      PT SHORT TERM GOAL #1   Title  Pt will be independent with HEP in order to improve strength and decrease back pain in order to improve pain-free function at home and work.    Baseline  03/08/19 HEP given    Time  4    Period  Weeks    Status  New        PT Long Term Goals - 04/12/19 1008      PT LONG TERM GOAL #1   Title  Pt will decrease mODI scoreby at least 13 points in order demonstrate clinically significant reduction in back pain/disability    Baseline  03/08/19 23    Time  8    Period  Weeks    Status  On-going      PT LONG TERM GOAL #2   Title  Pt will decrease worst back pain as reported on NPRS by at least 2 points in order to demonstrate clinically significant reduction in back pain.    Baseline  03/08/19 9/10 pain with all transitional movements    Time  8    Period  Weeks    Status  Achieved      PT LONG TERM GOAL #3   Title  Patient will be able to stand from standard chair with proper squat technique to demonstrate full LE ROM,  strength, and safety completing transfer in the community    Baseline  03/08/19 pushes from armrest into a pelvise forward  position, uses momentum to bring torso forward    Time  8    Period  Weeks    Status  On-going      PT LONG TERM GOAL #4   Title  Patient to demonstrate full golf-swing without guarding or aggravation of pain.    Baseline  50% limited at reassessment.    Time  12    Period  Weeks    Status  New            Plan - 04/12/19 1001    Clinical Impression Statement  Reassessment performed this date as patient has not been to PT in 5 weeks. Noted improvements in strength testing and tolerance to bsaic activity, however movments remain guarded, slow, and largely low amplitude. Pt educated extensively on how PT process works and importance of regularattendance and giving feedback on which interventions help allevieate/exacerbate pain. Joint mobilization and soft tissue work applied to lower lumbar area with good relief of pain. Trial of new HEP activities performed without issue and updated handout given. Pt also educated on log roll technique to facilicate bedmoblity at home. Pt had an incident upon arrive, AMB to BR prior to session, walked into a chair and cut his anterior proximal foreleg about the tuberosity. Pt reports he could not see chair due to face mask. Pt assisted with blood cleanup and application of 2 bandaids.    Rehab Potential  Good    PT Frequency  2x / week    PT Duration  8 weeks    PT Treatment/Interventions  Moist Heat;Therapeutic activities;Taping;Spinal Manipulations;Passive range of motion;Manual techniques;Patient/family education;Therapeutic exercise;Gait training;Cryotherapy;Traction;Electrical Stimulation;Ultrasound;Functional mobility training;Neuromuscular re-education;Dry needling    PT Next Visit Plan  HEP review    PT Home Exercise Plan  prone prop, repeated ext, sciatic nerve glide    Consulted and Agree with Plan of Care  Patient        Patient will benefit from skilled therapeutic intervention in order to improve the following deficits and impairments:  Postural dysfunction, Pain, Impaired flexibility, Increased fascial restricitons, Decreased activity tolerance, Decreased range of motion, Improper body mechanics, Decreased endurance, Decreased mobility, Hypomobility, Increased muscle spasms, Impaired sensation  Visit Diagnosis: Acute bilateral low back pain with bilateral sciatica     Problem List Patient Active Problem List   Diagnosis Date Noted   CVA (cerebral infarction) 08/21/2015   TIA (transient ischemic attack) 08/20/2015   Acute encephalopathy 08/20/2015   10:20 AM, 04/12/19 Etta Grandchild, PT, DPT Physical Therapist - [place] [phone] (Office)    Zadkiel Dragan C 04/12/2019, 10:13 AM  [facility] [address] [place], [address] Phone: [phone]   Fax:  [phone]  Name: VERLE GELSINGER MRN: [id number] Date of Birth: [birth year]

## 2019-04-20 ENCOUNTER — Ambulatory Visit: Payer: Medicare HMO

## 2019-04-20 ENCOUNTER — Other Ambulatory Visit: Payer: Self-pay

## 2019-04-20 DIAGNOSIS — M5442 Lumbago with sciatica, left side: Secondary | ICD-10-CM

## 2019-04-20 DIAGNOSIS — M5441 Lumbago with sciatica, right side: Secondary | ICD-10-CM

## 2019-04-20 NOTE — Patient Instructions (Signed)
Access Code: [email or web address]  URL: [email or web address]  Date: 04/20/2019  Prepared by: Joneen Boers   Exercises Seated Hip Internal Rotation AROM - 5 reps - 6 sets - 2x daily - 7x weekly

## 2019-04-20 NOTE — Therapy (Signed)
[facility] [address] [place], [address] Phone: [phone]   Fax:  [phone]  Physical Therapy Treatment  Patient Details  Name: Dalton Obrien MRN: [id number] Date of Birth: Mar 04, 1941 Referring Provider (PT): Wayland Denis PA-C    Encounter Date: 04/20/2019  PT End of Session - 04/20/19 0853    Visit Number  3    Number of Visits  17    Date for PT Re-Evaluation  05/03/19    PT Start Time  0853    PT Stop Time  0952    PT Time Calculation (min)  59 min    Activity Tolerance  Patient tolerated treatment well    Behavior During Therapy  [facility] for tasks assessed/performed       Past Medical History:  Diagnosis Date  . Asthma   . Cancer ([facility])    Basal Cell on Face  . CHF (congestive heart failure) ([place])   . 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  There were no vitals filed for this visit.  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 Better on everything. Hamstring pain is better. Has been doing his exercises. Feels much better now. Can walk an hour outdoors. Can play golf. The manual therapy for his back last session felt great. Feels spasm in his shoulder blades again. Can stand up without UE assist now. Getting better. Has 2 more shots on 04/27/2019 but does not know where. States having neck pain. Feels pop in his neck now about 4-5 times a day. No neck pain but its popping and grinding in there. The x-ray for his neck looks more jagged compared to his low back. The prone hip extension bothers his neck. Pain tends to start R low back and posterior hip.    Pertinent History  Pt is a 78 year old male with LBP over the past 2 weeks. Reports he usually plays golf 9 holes 1x a day, and reports 3 weeks ago he played 18 holes one evening and again in the am and  he has had pain gettin gout of bed ever since. Reports pain is limiting him in bed mobility, and STS transfers. Reports he as been taking pain medication that has made him constipated, which is making his pain worse, because he is bearing down harder when he uses the bathroom. Hx of bladder cancer with radiation and chemo treatment last fall following tumor removal, with some continued sensitivity to lumbar area. Reports pain is bilat LB, with sharp pain in R buttock and hamstring. Denies numbness/tingling sensation with shooting sensation down post RLE. Enjoys golfing, walking on course for 2.5 miles, pulling his cart. Reports worst pain over the past week 9/10 best 0/10. Pain is made worse by swinging golf club, prolonged walking, STS, and bed mobility. Pt denies N/V, B&B changes, unexplained weight fluctuation, saddle paresthesia, fever, night sweats, or unrelenting night pain at this time. Important to note patient has expressive and receptive aphasia from Aug 20 2015    Limitations  Lifting;Walking;House hold activities    How long can you sit comfortably?  unlimited: unlimited at evaluation    How long can you stand comfortably?  unlimited: unlimited at evaluation    How long can you walk comfortably?  1 hour; 3/4 mile 1 month ago  Diagnostic tests  CT scan scheduled 9/15    Patient Stated Goals  decrease pain to play golf    Currently in Pain?  Other (Comment)   no pain level provided                              PT Education - 04/20/19 1014    Education Details  ther-ex, HEP    Person(s) Educated  Patient    Methods  Explanation;Demonstration;Tactile cues;Verbal cues;Handout    Comprehension  Verbalized understanding;Returned demonstration         INTERVENTION THIS DATE:  SPEAK SLOWLY secondary to aphasia  Pacemaker   Medbridge Access Code: [email or web address]  Pt reports unable to perform full golf swing secondary to pacemaker placement. (04/20/2019)  Manual  therapy   Central PA L4: 3x30sec Grade 3 Uni PA: L5/S1 bilat 2x30sec Grade 3  Supine manual R hip IR with PT  Supine R hip IR PROM at 90/90 improved to 19 degrees    Therapeutic exercise  Log rolling 2-3x. No back pain when performed properly  Supine hip ROM at 90/90 position  IR PROM   R: 10 degrees   L: 17 degrees  Seated R hip IR 5x6  Reviewd and given as part of his HEP. Pt demonstrated and verbalized understanding. Handout provided.  Seated hip adduction pillow and glute max sqeeze 10x2 with 5 second holds   Seated B scapular retraction 5x5 seconds   Then with glute max squeeze 5x5 seconds   Decreased scapular pain   Improved exercise technique, movement at target joints, use of target muscles after mod verbal, visual, tactile cues.   Response to treatment Decreased B scapular muscle spasm with scapular retractions. Improved R hip IR after stretch. Pt tolerated session well without aggravation of low back pain.   Clinical impression  Increased time listening to subjective. First time working with pt.    Pt returns to PT today with reports of improved back pain and ability to return to playing golf since last session. Continued with manual therapy to promote joint movement to lumbar spine as well as working on improving R hip IR ROM secondary to stiffness compared to his L side. Worked on scapular strengthening as well to help decrease sensation of spasm at his shoulder blades. Pt tolerated session well without aggravation of symptoms. Pt will benefit from continued skilled physical therapy services to decrease pain, improve strength and function.        PT Short Term Goals - 03/08/19 1646      PT SHORT TERM GOAL #1   Title  Pt will be independent with HEP in order to improve strength and decrease back pain in order to improve pain-free function at home and work.    Baseline  03/08/19 HEP given    Time  4    Period  Weeks    Status  New        PT Long  Term Goals - 04/12/19 1008      PT LONG TERM GOAL #1   Title  Pt will decrease mODI scoreby at least 13 points in order demonstrate clinically significant reduction in back pain/disability    Baseline  03/08/19 23    Time  8    Period  Weeks    Status  On-going      PT LONG TERM GOAL #2   Title  Pt will decrease worst back pain as reported on  NPRS by at least 2 points in order to demonstrate clinically significant reduction in back pain.    Baseline  03/08/19 9/10 pain with all transitional movements    Time  8    Period  Weeks    Status  Achieved      PT LONG TERM GOAL #3   Title  Patient will be able to stand from standard chair with proper squat technique to demonstrate full LE ROM, strength, and safety completing transfer in the community    Baseline  03/08/19 pushes from armrest into a pelvise forward position, uses momentum to bring torso forward    Time  8    Period  Weeks    Status  On-going      PT LONG TERM GOAL #4   Title  Patient to demonstrate full golf-swing without guarding or aggravation of pain.    Baseline  50% limited at reassessment.    Time  12    Period  Weeks    Status  New            Plan - 04/20/19 1014    Clinical Impression Statement  Pt returns to PT today with reports of improved back pain and ability to return to playing golf since last session. Continued with manual therapy to promote joint movement to lumbar spine as well as working on improving R hip IR ROM secondary to stiffness compared to his L side. Worked on scapular strengthening as well to help decrease sensation of spasm at his shoulder blades. Pt tolerated session well without aggravation of symptoms. Pt will benefit from continued skilled physical therapy services to decrease pain, improve strength and function.    Rehab Potential  Good    PT Frequency  2x / week    PT Duration  8 weeks    PT Treatment/Interventions  Moist Heat;Therapeutic activities;Taping;Spinal Manipulations;Passive  range of motion;Manual techniques;Patient/family education;Therapeutic exercise;Gait training;Cryotherapy;Traction;Electrical Stimulation;Ultrasound;Functional mobility training;Neuromuscular re-education;Dry needling    PT Next Visit Plan  HEP review    PT Home Exercise Plan  prone prop, repeated ext, sciatic nerve glide    Consulted and Agree with Plan of Care  Patient       Patient will benefit from skilled therapeutic intervention in order to improve the following deficits and impairments:  Postural dysfunction, Pain, Impaired flexibility, Increased fascial restricitons, Decreased activity tolerance, Decreased range of motion, Improper body mechanics, Decreased endurance, Decreased mobility, Hypomobility, Increased muscle spasms, Impaired sensation  Visit Diagnosis: Acute bilateral low back pain with bilateral sciatica     Problem List Patient Active Problem List   Diagnosis Date Noted  . CVA (cerebral infarction) 08/21/2015  . TIA (transient ischemic attack) 08/20/2015  . Acute encephalopathy 08/20/2015    Joneen Boers PT, DPT   04/20/2019, 10:17 AM  [facility] [address], [place], [address] Phone: [phone]   Fax:  [phone]  Name: Dalton Obrien MRN: [id number] Date of Birth: 03-25-1941

## 2019-04-24 ENCOUNTER — Ambulatory Visit: Payer: Medicare HMO

## 2019-04-24 ENCOUNTER — Other Ambulatory Visit: Payer: Self-pay

## 2019-04-24 DIAGNOSIS — M5442 Lumbago with sciatica, left side: Secondary | ICD-10-CM | POA: Diagnosis not present

## 2019-04-24 DIAGNOSIS — M5441 Lumbago with sciatica, right side: Secondary | ICD-10-CM

## 2019-04-24 NOTE — Therapy (Signed)
[facility] [address], [place], [address] Phone: [phone]   Fax:  [phone]  Physical Therapy Treatment  Patient Details  Name: Dalton Obrien MRN: [id number] Date of Birth: 11-22-1940 Referring Provider (PT): Wayland Denis PA-C    Encounter Date: 04/24/2019  PT End of Session - 04/24/19 0904    Visit Number  4    Number of Visits  17    Date for PT Re-Evaluation  05/03/19    PT Start Time  0904    PT Stop Time  0951    PT Time Calculation (min)  47 min    Activity Tolerance  Patient tolerated treatment well    Behavior During Therapy  [facility] for tasks assessed/performed       Past Medical History:  Diagnosis Date  . Asthma   . Cancer (HCC)    Basal Cell on Face  . CHF (congestive heart failure) ([place])   . 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  There were no vitals filed for this visit.  Subjective Assessment - 04/24/19 0904    Subjective  Back is doing much better. No pain currently. Very careful to not do anything that causes pain.  Pain in his shoulder blades dissipates more quickly now (disappears in about an hour after is starts). Squeezing his shoulder blades together helps. L shoulder blades rubs over his ribs at the moment.  Thinks his back is getting better. Delayed his shots for his back to December or January.    Pertinent History  Pt is a 78 year old male with LBP over the past 2 weeks. Reports he usually plays golf 9 holes 1x a day, and reports 3 weeks ago he played 18 holes one evening and again in the am and he has had pain gettin gout of bed ever since. Reports pain is limiting him in bed mobility, and STS transfers. Reports he as been taking pain medication that has made him constipated, which is making his pain worse, because he is bearing down harder when he uses the bathroom. Hx of bladder cancer  with radiation and chemo treatment last fall following tumor removal, with some continued sensitivity to lumbar area. Reports pain is bilat LB, with sharp pain in R buttock and hamstring. Denies numbness/tingling sensation with shooting sensation down post RLE. Enjoys golfing, walking on course for 2.5 miles, pulling his cart. Reports worst pain over the past week 9/10 best 0/10. Pain is made worse by swinging golf club, prolonged walking, STS, and bed mobility. Pt denies N/V, B&B changes, unexplained weight fluctuation, saddle paresthesia, fever, night sweats, or unrelenting night pain at this time. Important to note patient has expressive and receptive aphasia from Aug 20 2015    Limitations  Lifting;Walking;House hold activities    How long can you sit comfortably?  unlimited: unlimited at evaluation    How long can you stand comfortably?  unlimited: unlimited at evaluation    How long can you walk comfortably?  1 hour; 3/4 mile 1 month ago    Diagnostic tests  CT scan scheduled 9/15    Patient Stated Goals  decrease pain to play golf    Currently in Pain?  No/denies    Pain Score  0-No pain  PT Education - 04/24/19 0916    Education Details  ther-ex    Person(s) Educated  Patient    Methods  Explanation;Demonstration;Tactile cues;Verbal cues    Comprehension  Verbalized understanding;Returned demonstration       INTERVENTION THIS DATE:     SPEAK SLOWLY secondary to aphasia  Pacemaker   Medbridge Access Code: [email or web address]  Pt reports unable to perform full golf swing secondary to pacemaker placement. (04/20/2019)     Therapeutic exercise  Seated manually resisted scapular retraction targeting the lower trap muscles  L 10x3 with 5 second holds   Seated thoracic extension over chair, feet on 4 inch step to help decrease low back stress  10x5 seconds for 2 sets  Sitting with proper posture   Manually resisted  trunk flexion isometrics 10x2 with 5 seconds    B groin pain which eases with upright posture  Standing B scapular retraction resisting red band 10x5 seconds for 2 sets  Supine R hip IR stretch with towel   30 seconds x 3  Improved exercise technique, movement at target joints, use of target muscles after mod verbal, visual, tactile cues.   Increased time secondary to aphasia    Response to treatment Pt tolerated session well without aggravation of symptoms. No complain of back pain after session.   Clinical impression Pt demonstrates improvement of symptoms based of subject reports from the past few sessions. Pt seems to do well with a combination of gentle extension and flexion related movements and positions as well as improving R hip IR ROM. Pt tolerated session well without aggravation of symptoms. Pt will benefit from continued skilled physical therapy services to decrease pain, improve strength and function.     PT Short Term Goals - 03/08/19 1646      PT SHORT TERM GOAL #1   Title  Pt will be independent with HEP in order to improve strength and decrease back pain in order to improve pain-free function at home and work.    Baseline  03/08/19 HEP given    Time  4    Period  Weeks    Status  New        PT Long Term Goals - 04/12/19 1008      PT LONG TERM GOAL #1   Title  Pt will decrease mODI scoreby at least 13 points in order demonstrate clinically significant reduction in back pain/disability    Baseline  03/08/19 23    Time  8    Period  Weeks    Status  On-going      PT LONG TERM GOAL #2   Title  Pt will decrease worst back pain as reported on NPRS by at least 2 points in order to demonstrate clinically significant reduction in back pain.    Baseline  03/08/19 9/10 pain with all transitional movements    Time  8    Period  Weeks    Status  Achieved      PT LONG TERM GOAL #3   Title  Patient will be able to stand from standard chair with proper squat technique  to demonstrate full LE ROM, strength, and safety completing transfer in the community    Baseline  03/08/19 pushes from armrest into a pelvise forward position, uses momentum to bring torso forward    Time  8    Period  Weeks    Status  On-going      PT LONG TERM GOAL #4   Title  Patient to demonstrate full golf-swing without guarding or aggravation of pain.    Baseline  50% limited at reassessment.    Time  12    Period  Weeks    Status  New            Plan - 04/24/19 0916    Clinical Impression Statement  Pt demonstrates improvement of symptoms based of subject reports from the past few sessions. Pt seems to do well with a combination of gentle extension and flexion related movements and positions as well as improving R hip IR ROM. Pt tolerated session well without aggravation of symptoms. Pt will benefit from continued skilled physical therapy services to decrease pain, improve strength and function.    Rehab Potential  Good    PT Frequency  2x / week    PT Duration  8 weeks    PT Treatment/Interventions  Moist Heat;Therapeutic activities;Taping;Spinal Manipulations;Passive range of motion;Manual techniques;Patient/family education;Therapeutic exercise;Gait training;Cryotherapy;Traction;Electrical Stimulation;Ultrasound;Functional mobility training;Neuromuscular re-education;Dry needling    PT Next Visit Plan  HEP review    PT Home Exercise Plan  prone prop, repeated ext, sciatic nerve glide    Consulted and Agree with Plan of Care  Patient       Patient will benefit from skilled therapeutic intervention in order to improve the following deficits and impairments:  Postural dysfunction, Pain, Impaired flexibility, Increased fascial restricitons, Decreased activity tolerance, Decreased range of motion, Improper body mechanics, Decreased endurance, Decreased mobility, Hypomobility, Increased muscle spasms, Impaired sensation  Visit Diagnosis: Acute bilateral low back pain with  bilateral sciatica     Problem List Patient Active Problem List   Diagnosis Date Noted  . CVA (cerebral infarction) 08/21/2015  . TIA (transient ischemic attack) 08/20/2015  . Acute encephalopathy 08/20/2015    Joneen Boers PT, DPT   04/24/2019, 10:07 AM  [facility] [address], [place], [address] Phone: [phone]   Fax:  [phone]  Name: Dalton Obrien MRN: [id number] Date of Birth: July 27, 1940

## 2019-04-26 ENCOUNTER — Other Ambulatory Visit: Payer: Self-pay

## 2019-04-26 ENCOUNTER — Ambulatory Visit: Payer: Medicare HMO

## 2019-04-26 DIAGNOSIS — M5441 Lumbago with sciatica, right side: Secondary | ICD-10-CM

## 2019-04-26 DIAGNOSIS — M5442 Lumbago with sciatica, left side: Secondary | ICD-10-CM

## 2019-04-26 NOTE — Patient Instructions (Addendum)
Seated bilateral shoulder extension isometrics  Sitting on a chair   Gently press your hands on your thighs to feel your trunk muscles activate.    Hold for 5 seconds.    Repeat 10 times.   Perform 3 sets daily.      Sitting with lumbar support   Scoot your rear end all the way back on your chair   Gently lean forward   Place a towel roll (can unroll it if it is too thick) at the low back   Lean back against it comfortably   Perform for 2 minutes to 5 minutes at a time throughout the day.

## 2019-04-26 NOTE — Therapy (Signed)
[facility] [address] [place], [address] Phone: [phone]   Fax:  [phone]  Physical Therapy Treatment  Patient Details  Name: Dalton Obrien MRN: [id number] Date of Birth: 28-Jul-1940 Referring Provider (PT): Wayland Denis PA-C    Encounter Date: 04/26/2019  PT End of Session - 04/26/19 0901    Visit Number  5    Number of Visits  17    Date for PT Re-Evaluation  05/03/19    PT Start Time  0901    PT Stop Time  0941    PT Time Calculation (min)  40 min    Activity Tolerance  Patient tolerated treatment well    Behavior During Therapy  [facility] for tasks assessed/performed       Past Medical History:  Diagnosis Date  . Asthma   . Cancer (HCC)    Basal Cell on Face  . CHF (congestive heart failure) ([place])   . 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  There were no vitals filed for this visit.  Subjective Assessment - 04/26/19 0901    Subjective  Back is better. The same as Monday but better than the week before. Very careful not to cause a spasm or cramp or pinch. Pt moves slowly. He'll move more quickly eventually when his back does not bother him for a while. No pain currently. Has not had pain since Monday. Has been doing his shoulder blade exercises. Has not had the shoulder blade pain since Monday. It is easier to not have pain today compared to a week ago. Also able to stand up from a regular chair without hands.    Pertinent History  Pt is a 78 year old male with LBP over the past 2 weeks. Reports he usually plays golf 9 holes 1x a day, and reports 3 weeks ago he played 18 holes one evening and again in the am and he has had pain gettin gout of bed ever since. Reports pain is limiting him in bed mobility, and STS transfers. Reports he as been taking pain medication that has made him constipated, which is making his  pain worse, because he is bearing down harder when he uses the bathroom. Hx of bladder cancer with radiation and chemo treatment last fall following tumor removal, with some continued sensitivity to lumbar area. Reports pain is bilat LB, with sharp pain in R buttock and hamstring. Denies numbness/tingling sensation with shooting sensation down post RLE. Enjoys golfing, walking on course for 2.5 miles, pulling his cart. Reports worst pain over the past week 9/10 best 0/10. Pain is made worse by swinging golf club, prolonged walking, STS, and bed mobility. Pt denies N/V, B&B changes, unexplained weight fluctuation, saddle paresthesia, fever, night sweats, or unrelenting night pain at this time. Important to note patient has expressive and receptive aphasia from Aug 20 2015    Limitations  Lifting;Walking;House hold activities    How long can you sit comfortably?  unlimited: unlimited at evaluation    How long can you stand comfortably?  unlimited: unlimited at evaluation    How long can you walk comfortably?  1 hour; 3/4 mile 1 month ago    Diagnostic tests  CT scan scheduled 9/15    Patient Stated Goals  decrease pain to play golf    Currently in Pain?  No/denies    Pain Score  0-No pain                               PT Education - 04/26/19 0910    Education Details  ther-ex, HEP    Person(s) Educated  Patient    Methods  Explanation;Demonstration;Tactile cues;Verbal cues;Handout    Comprehension  Verbalized understanding;Returned demonstration      INTERVENTION THIS DATE:     SPEAK SLOWLYsecondary to aphasia  Pacemaker   MedbridgeAccess Code: [email or web address]  Pt reports unable to perform full golf swing secondary to pacemaker placement. (04/20/2019)     Therapeutic exercise   Seated thoracic extension over chair, feet on 4 inch step to help decrease low back stress             10x5 seconds for 2 sets   Seated manually resisted scapular  retraction targeting the lower trap muscles             L 10x3 with 5 second holds    Seated B shoulder extension isometrics, hands on thighs to promote trunk muscle strengthening  10x5 seconds for 3 sets   Sitting with lumbar towel roll     Reviewed new HEP. Handout provided. Increased time to review HEP secondary to questions.      Try seated hip adduction ball and glute max squeeze next visit if appropriate     Improved exercise technique, movement at target joints, use of target muscles after mod verbal, visual, tactile cues.   Increased time secondary to aphasia    Response to treatment Pt tolerated session well without aggravation of symptoms. No complain of back pain after session.   Clinical impression Pt demonstrates good progress with alleviation of L scapular pain and decreased low back pain based on subjective reports. Good carry over of decreased pain from previous sessions. Continued working on scapular strengthening, thoracic extension and gentle low back extension to help decrease stress to low back. Pt will benefit from continued skilled physical therapy services to decrease pain, improve strength and function.         Access Code: [email or web address]  URL: [email or web address]  Date: 04/26/2019  Prepared by: Joneen Boers   Exercises Seated Hip Internal Rotation AROM - 5 reps - 6 sets - 2x daily - 7x weekly Seated Thoracic Lumbar Extension - 10 reps - 3 sets - 5 seconds hold - 1x daily - 7x weekly  PT Short Term Goals - 03/08/19 1646      PT SHORT TERM GOAL #1   Title  Pt will be independent with HEP in order to improve strength and decrease back pain in order to improve pain-free function at home and work.    Baseline  03/08/19 HEP given    Time  4    Period  Weeks    Status  New        PT Long Term Goals - 04/12/19 1008      PT LONG TERM GOAL #1   Title  Pt will decrease mODI scoreby at least 13 points in order demonstrate  clinically significant reduction in back pain/disability    Baseline  03/08/19 23    Time  8    Period  Weeks    Status  On-going      PT LONG TERM GOAL #2   Title  Pt will decrease worst back pain as reported on NPRS by at least 2 points in order to demonstrate clinically significant reduction  in back pain.    Baseline  03/08/19 9/10 pain with all transitional movements    Time  8    Period  Weeks    Status  Achieved      PT LONG TERM GOAL #3   Title  Patient will be able to stand from standard chair with proper squat technique to demonstrate full LE ROM, strength, and safety completing transfer in the community    Baseline  03/08/19 pushes from armrest into a pelvise forward position, uses momentum to bring torso forward    Time  8    Period  Weeks    Status  On-going      PT LONG TERM GOAL #4   Title  Patient to demonstrate full golf-swing without guarding or aggravation of pain.    Baseline  50% limited at reassessment.    Time  12    Period  Weeks    Status  New            Plan - 04/26/19 0859    Clinical Impression Statement  Pt demonstrates good progress with alleviation of L scapular pain and decreased low back pain based on subjective reports. Good carry over of decreased pain from previous sessions. Continued working on scapular strengthening, thoracic extension and gentle low back extension to help decrease stress to low back. Pt will benefit from continued skilled physical therapy services to decrease pain, improve strength and function.    Rehab Potential  Good    PT Frequency  2x / week    PT Duration  8 weeks    PT Treatment/Interventions  Moist Heat;Therapeutic activities;Taping;Spinal Manipulations;Passive range of motion;Manual techniques;Patient/family education;Therapeutic exercise;Gait training;Cryotherapy;Traction;Electrical Stimulation;Ultrasound;Functional mobility training;Neuromuscular re-education;Dry needling    PT Next Visit Plan  HEP review    PT Home  Exercise Plan  prone prop, repeated ext, sciatic nerve glide    Consulted and Agree with Plan of Care  Patient       Patient will benefit from skilled therapeutic intervention in order to improve the following deficits and impairments:  Postural dysfunction, Pain, Impaired flexibility, Increased fascial restricitons, Decreased activity tolerance, Decreased range of motion, Improper body mechanics, Decreased endurance, Decreased mobility, Hypomobility, Increased muscle spasms, Impaired sensation  Visit Diagnosis: Acute bilateral low back pain with bilateral sciatica     Problem List Patient Active Problem List   Diagnosis Date Noted  . CVA (cerebral infarction) 08/21/2015  . TIA (transient ischemic attack) 08/20/2015  . Acute encephalopathy 08/20/2015    Joneen Boers PT, DPT   04/26/2019, 11:53 AM  [facility] [address] [place], [address] Phone: [phone]   Fax:  [phone]  Name: JONERIK CULL MRN: [id number] Date of Birth: 04-11-1941

## 2019-05-01 ENCOUNTER — Ambulatory Visit: Payer: Medicare HMO

## 2019-05-02 ENCOUNTER — Ambulatory Visit: Payer: Medicare HMO

## 2019-05-02 ENCOUNTER — Other Ambulatory Visit: Payer: Self-pay

## 2019-05-02 DIAGNOSIS — M5442 Lumbago with sciatica, left side: Secondary | ICD-10-CM | POA: Diagnosis not present

## 2019-05-02 DIAGNOSIS — M5441 Lumbago with sciatica, right side: Secondary | ICD-10-CM

## 2019-05-02 NOTE — Therapy (Signed)
[facility] [address] [place], [address] Phone: [phone]   Fax:  [phone]  Physical Therapy Treatment  Patient Details  Name: Dalton Obrien MRN: [id number] Date of Birth: 30-Sep-1940 Referring Provider (PT): Wayland Denis PA-C    Encounter Date: 05/02/2019  PT End of Session - 05/02/19 0904    Visit Number  6    Number of Visits  17    Date for PT Re-Evaluation  05/03/19    Authorization Type  6    Authorization Time Period  of 10 medicare    PT Start Time  0904    PT Stop Time  0946    PT Time Calculation (min)  42 min    Activity Tolerance  Patient tolerated treatment well    Behavior During Therapy  [facility], [facility], [facility] for tasks assessed/performed       Past Medical History:  Diagnosis Date  . Asthma   . Cancer (HCC)    Basal Cell on Face  . CHF (congestive heart failure) ([place])   . 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  There were no vitals filed for this visit.  Subjective Assessment - 05/02/19 0905    Subjective  Back is doing very well. Using the lumbar thing in his car. Pt sprained his L ankle playing golf. Pt used a wrap on his ankle for 2 days which helped. Pt strained his ankle again.  1-2/10 shoulder blade area currently. No low back pain currently. 1/10 low back pain at most for the past 7 days. 1-2/10 shoulder blade pain at most for the past 7 days.  Pt states he can sleep really well now.    Pertinent History  Pt is a 78 year old male with LBP over the past 2 weeks. Reports he usually plays golf 9 holes 1x a day, and reports 3 weeks ago he played 18 holes one evening and again in the am and he has had pain gettin gout of bed ever since. Reports pain is limiting him in bed mobility, and STS transfers. Reports he as been taking pain medication that has made him constipated, which is making his pain worse, because he  is bearing down harder when he uses the bathroom. Hx of bladder cancer with radiation and chemo treatment last fall following tumor removal, with some continued sensitivity to lumbar area. Reports pain is bilat LB, with sharp pain in R buttock and hamstring. Denies numbness/tingling sensation with shooting sensation down post RLE. Enjoys golfing, walking on course for 2.5 miles, pulling his cart. Reports worst pain over the past week 9/10 best 0/10. Pain is made worse by swinging golf club, prolonged walking, STS, and bed mobility. Pt denies N/V, B&B changes, unexplained weight fluctuation, saddle paresthesia, fever, night sweats, or unrelenting night pain at this time. Important to note patient has expressive and receptive aphasia from Aug 20 2015    Limitations  Lifting;Walking;House hold activities    How long can you sit comfortably?  unlimited: unlimited at evaluation    How long can you stand comfortably?  unlimited: unlimited at evaluation    How long can you walk comfortably?  1 hour; 3/4 mile 1 month ago    Diagnostic tests  CT scan scheduled 9/15    Patient Stated Goals  decrease pain to play golf    Currently in Pain?  Yes  Pain Score  2    1-2/10 shoulder blade pain        [facility] PT Assessment - 05/02/19 1243      Observation/Other Assessments   Modified Oswestry  26%                           PT Education - 05/02/19 0919    Education Details  ther-ex, HEP    Person(s) Educated  Patient    Methods  Explanation;Demonstration;Tactile cues;Verbal cues;Handout    Comprehension  Verbalized understanding;Returned demonstration        INTERVENTION THIS DATE:     SPEAK SLOWLYsecondary to aphasia  Pacemaker   MedbridgeAccess Code: [email or web address]  Pt reports unable to perform full golf swing secondary to pacemaker placement. (04/20/2019)     Therapeutic exercise  Seated manually resisted scapular retraction targeting the lower trap  muscles L 10x3 with 5 second holds     Seated thoracic extension over chair, feet on 4 inch step to help decrease low back stress 10x5 seconds for 2 sets  Seated B shoulder extension isometrics, hands on thighs to promote trunk muscle strengthening             10x5 seconds for 3 sets  seated hip adduction ball and glute max squeeze 10x2 with 5 second holds  Standing hip abduction with B UE assist   R 10x3  L 10x3  Standing hip extension with B UE assist  L 10x  R 10x  Slight low back discomfort  Standing mini squats  Static mini lunge with one UE assist  R 1x. L ankle discomfort  L 10x     Improved exercise technique, movement at target joints, use of target muscles after mod verbal, visual, tactile cues.  Increased time secondary to aphasia    Response to treatment Pt tolerated session well without aggravation of symptoms. No complain of back pain after session.  Clinical impression Pt demonstrates decreasing back and shoulder blade pain overall based on subjective reports. Continued working on scapular strengthening on L to decrease L shoulder blade pain as well as trunk and glute strengthening to help decrease stress to low back when performing standing tasks. Pt tolerated session well without aggravation of symptoms. Pt will benefit from continued skilled physical therapy services to decrease pain, improve strength and function.     PT Short Term Goals - 03/08/19 1646      PT SHORT TERM GOAL #1   Title  Pt will be independent with HEP in order to improve strength and decrease back pain in order to improve pain-free function at home and work.    Baseline  03/08/19 HEP given    Time  4    Period  Weeks    Status  New        PT Long Term Goals - 04/12/19 1008      PT LONG TERM GOAL #1   Title  Pt will decrease mODI scoreby at least 13 points in order demonstrate clinically significant reduction in back pain/disability     Baseline  03/08/19 23    Time  8    Period  Weeks    Status  On-going      PT LONG TERM GOAL #2   Title  Pt will decrease worst back pain as reported on NPRS by at least 2 points in order to demonstrate clinically significant reduction in back pain.    Baseline  03/08/19 9/10  pain with all transitional movements    Time  8    Period  Weeks    Status  Achieved      PT LONG TERM GOAL #3   Title  Patient will be able to stand from standard chair with proper squat technique to demonstrate full LE ROM, strength, and safety completing transfer in the community    Baseline  03/08/19 pushes from armrest into a pelvise forward position, uses momentum to bring torso forward    Time  8    Period  Weeks    Status  On-going      PT LONG TERM GOAL #4   Title  Patient to demonstrate full golf-swing without guarding or aggravation of pain.    Baseline  50% limited at reassessment.    Time  12    Period  Weeks    Status  New            Plan - 05/02/19 0920    Clinical Impression Statement  Pt demonstrates decreasing back and shoulder blade pain overall based on subjective reports. Continued working on scapular strengthening on L to decrease L shoulder blade pain as well as trunk and glute strengthening to help decrease stress to low back when performing standing tasks. Pt tolerated session well without aggravation of symptoms. Pt will benefit from continued skilled physical therapy services to decrease pain, improve strength and function.    Rehab Potential  Good    PT Frequency  2x / week    PT Duration  8 weeks    PT Treatment/Interventions  Moist Heat;Therapeutic activities;Taping;Spinal Manipulations;Passive range of motion;Manual techniques;Patient/family education;Therapeutic exercise;Gait training;Cryotherapy;Traction;Electrical Stimulation;Ultrasound;Functional mobility training;Neuromuscular re-education;Dry needling    PT Next Visit Plan  HEP review    PT Home Exercise Plan  prone prop,  repeated ext, sciatic nerve glide    Consulted and Agree with Plan of Care  Patient       Patient will benefit from skilled therapeutic intervention in order to improve the following deficits and impairments:  Postural dysfunction, Pain, Impaired flexibility, Increased fascial restricitons, Decreased activity tolerance, Decreased range of motion, Improper body mechanics, Decreased endurance, Decreased mobility, Hypomobility, Increased muscle spasms, Impaired sensation  Visit Diagnosis: Acute bilateral low back pain with bilateral sciatica     Problem List Patient Active Problem List   Diagnosis Date Noted  . CVA (cerebral infarction) 08/21/2015  . TIA (transient ischemic attack) 08/20/2015  . Acute encephalopathy 08/20/2015    Joneen Boers PT, DPT   05/02/2019, 12:56 PM  [facility] [address], [place], [address] Phone: [phone]   Fax:  [phone]  Name: Dalton Obrien MRN: [id number] Date of Birth: 1941/03/22

## 2019-05-02 NOTE — Patient Instructions (Addendum)
Please perform all exercises except the ones from the first day for now.    If it is too much, cut the exercises in half, do one half one day, and do the other half the other day.

## 2019-05-08 ENCOUNTER — Ambulatory Visit: Payer: Medicare HMO | Attending: Student

## 2019-05-08 ENCOUNTER — Other Ambulatory Visit: Payer: Self-pay

## 2019-05-08 DIAGNOSIS — M5442 Lumbago with sciatica, left side: Secondary | ICD-10-CM | POA: Diagnosis not present

## 2019-05-08 DIAGNOSIS — M5441 Lumbago with sciatica, right side: Secondary | ICD-10-CM | POA: Insufficient documentation

## 2019-05-08 NOTE — Therapy (Signed)
[facility] [address] [place], [address] Phone: [phone]   Fax:  [phone]  Physical Therapy Treatment And Discharge Summary  Patient Details  Name: Dalton Obrien MRN: [id number] Date of Birth: March 05, 1941 Referring Provider (PT): Wayland Denis PA-C    Encounter Date: 05/08/2019  PT End of Session - 05/08/19 0949    Visit Number  7    Number of Visits  17    Date for PT Re-Evaluation  05/08/19    Authorization Type  7    Authorization Time Period  of 10 medicare    PT Start Time  0950    PT Stop Time  1017    PT Time Calculation (min)  27 min    Activity Tolerance  Patient tolerated treatment well    Behavior During Therapy  [facility] for tasks assessed/performed       Past Medical History:  Diagnosis Date  . Asthma   . Cancer (HCC)    Basal Cell on Face  . CHF (congestive heart failure) ([place])   . 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  There were no vitals filed for this visit.  Subjective Assessment - 05/08/19 0951    Subjective  Back is great. No pain currently. Only swings 2/3 of his swing for golf due to pacemaker. When pt plays golf, and does therapy after golf, he cannot do all of the sets due to not wanting to increase sensitivity to his L L5/S1. 0/10 low back pain at most for the past 7 days. Watches his movements due to arthritis in his back.  Feels like he can continue his progress with his exercises at home. After today. Can stand up from his chair now without hands.    Pertinent History  Pt is a 78 year old male with LBP over the past 2 weeks. Reports he usually plays golf 9 holes 1x a day, and reports 3 weeks ago he played 18 holes one evening and again in the am and he has had pain gettin gout of bed ever since. Reports pain is limiting him in bed mobility, and STS transfers. Reports he as been taking  pain medication that has made him constipated, which is making his pain worse, because he is bearing down harder when he uses the bathroom. Hx of bladder cancer with radiation and chemo treatment last fall following tumor removal, with some continued sensitivity to lumbar area. Reports pain is bilat LB, with sharp pain in R buttock and hamstring. Denies numbness/tingling sensation with shooting sensation down post RLE. Enjoys golfing, walking on course for 2.5 miles, pulling his cart. Reports worst pain over the past week 9/10 best 0/10. Pain is made worse by swinging golf club, prolonged walking, STS, and bed mobility. Pt denies N/V, B&B changes, unexplained weight fluctuation, saddle paresthesia, fever, night sweats, or unrelenting night pain at this time. Important to note patient has expressive and receptive aphasia from Aug 20 2015    Limitations  Lifting;Walking;House hold activities    How long can you sit comfortably?  unlimited: unlimited at evaluation    How long can you stand comfortably?  unlimited: unlimited at evaluation    How long can you walk comfortably?  1 hour; 3/4 mile 1 month ago    Diagnostic tests  CT scan scheduled 9/15    Patient Stated Goals  decrease pain  to play golf    Currently in Pain?  No/denies    Pain Score  0-No pain                               PT Education - 05/08/19 1655    Education Details  ther-ex    Person(s) Educated  Patient    Methods  Explanation;Demonstration;Tactile cues;Verbal cues    Comprehension  Returned demonstration;Verbalized understanding       INTERVENTION THIS DATE:     SPEAK SLOWLYsecondary to aphasia  Pacemaker   MedbridgeAccess Code: [email or web address]  Pt reports unable to perform full golf swing secondary to pacemaker placement. (04/20/2019)     Therapeutic exercise  Reviewed which HEP to perform and how pt performs some of the exercises such as the standing hip abduction and seated  glute and scapular squeeze.   Reviewed plan of care: continue with HEP  Seated manually resisted scapular retraction targeting the lower trap muscles L 10x3 with 5 second holds     Improved exercise technique, movement at target joints, use of target muscles after mod verbal, visual, tactile cues.    Increased time secondary to aphasia    Response to treatment Pt tolerated session well without aggravation of symptoms. No complain of back pain after session.  Clinical impression Pt demonstrates significant decrease in back pain, improved ability to perform sit <> stand and improved ability to play golf more comfortably since initial evaluation. Pt has made very good progress with PT towards goals. Pt also demonstrates independence and consistency with his HEP. Skilled PT services discharged with PT continuing progress with his HEP after recert for today's appointment.      PT Short Term Goals - 05/08/19 1006      PT SHORT TERM GOAL #1   Title  Pt will be independent with HEP in order to improve strength and decrease back pain in order to improve pain-free function at home and work.    Baseline  03/08/19 HEP given; No questions with HEP (05/08/2019)    Time  4    Period  Weeks    Status  Achieved    Target Date  05/08/19        PT Long Term Goals - 05/08/19 1009      PT LONG TERM GOAL #1   Title  Pt will decrease mODI scoreby at least 13 points in order demonstrate clinically significant reduction in back pain/disability    Baseline  03/08/19 23; 26% (05/02/2019)    Time  8    Period  Weeks    Status  On-going    Target Date  05/03/19      PT LONG TERM GOAL #2   Title  Pt will decrease worst back pain as reported on NPRS by at least 2 points in order to demonstrate clinically significant reduction in back pain.    Baseline  03/08/19 9/10 pain with all transitional movements; 0/10 (05/08/2019)    Time  8    Period  Weeks    Status  Achieved    Target  Date  05/03/19      PT LONG TERM GOAL #3   Title  Patient will be able to stand from standard chair with proper squat technique to demonstrate full LE ROM, strength, and safety completing transfer in the community    Baseline  03/08/19 pushes from armrest into a pelvise forward position, uses momentum to bring  torso forward; independent sit <> stand from regular chair without use off UE and with good form (05/08/2019)    Time  8    Period  Weeks    Status  Achieved    Target Date  05/03/19      PT LONG TERM GOAL #4   Title  Patient to demonstrate full golf-swing without guarding or aggravation of pain.    Baseline  50% limited at reassessment; 25% limited based of subjective reports. No full golf swing due to pacemaker (05/08/2019)    Time  12    Period  Weeks    Status  Partially Met    Target Date  05/03/19            Plan - 05/08/19 1656    Clinical Impression Statement  Pt demonstrates significant decrease in back pain, improved ability to perform sit <> stand and improved ability to play golf more comfortably since initial evaluation. Pt has made very good progress with PT towards goals. Pt also demonstrates independence and consistency with his HEP. Skilled PT services discharged with PT continuing progress with his HEP after recert for today's appointment.    Stability/Clinical Decision Making  Stable/Uncomplicated    Clinical Decision Making  Low    Rehab Potential  Good    PT Treatment/Interventions  Therapeutic activities;Passive range of motion;Manual techniques;Patient/family education;Therapeutic exercise;Functional mobility training;Neuromuscular re-education    PT Next Visit Plan  Continue progress with HEP    Consulted and Agree with Plan of Care  Patient       Patient will benefit from skilled therapeutic intervention in order to improve the following deficits and impairments:  Postural dysfunction, Pain, Impaired flexibility, Increased fascial restricitons, Decreased  activity tolerance, Decreased range of motion, Improper body mechanics, Decreased endurance, Decreased mobility, Hypomobility, Increased muscle spasms, Impaired sensation  Visit Diagnosis: Acute bilateral low back pain with bilateral sciatica - Plan: PT plan of care cert/re-cert     Problem List Patient Active Problem List   Diagnosis Date Noted  . CVA (cerebral infarction) 08/21/2015  . TIA (transient ischemic attack) 08/20/2015  . Acute encephalopathy 08/20/2015    Thank you for your referral.   Joneen Boers PT, DPT   05/08/2019, 5:06 PM  [facility] [address], [place], [address] Phone: [phone]   Fax:  [phone]  Name: BRITTON BERA MRN: [id number] Date of Birth: 07-19-1940

## 2019-05-08 NOTE — Patient Instructions (Signed)
HEP  Hold off prone hip extension due to neck discomfort when doing them

## 2019-08-23 ENCOUNTER — Other Ambulatory Visit: Payer: Self-pay

## 2019-08-23 ENCOUNTER — Ambulatory Visit: Payer: Medicare HMO | Attending: Student

## 2019-08-23 DIAGNOSIS — G8929 Other chronic pain: Secondary | ICD-10-CM

## 2019-08-23 DIAGNOSIS — M545 Low back pain, unspecified: Secondary | ICD-10-CM

## 2019-08-23 DIAGNOSIS — M25562 Pain in left knee: Secondary | ICD-10-CM | POA: Insufficient documentation

## 2019-08-23 DIAGNOSIS — M6281 Muscle weakness (generalized): Secondary | ICD-10-CM

## 2019-08-23 DIAGNOSIS — M25552 Pain in left hip: Secondary | ICD-10-CM

## 2019-08-23 NOTE — Therapy (Signed)
[facility] [address], [place], [address] Phone: [phone]   Fax:  [phone]  Physical Therapy Evaluation  Patient Details  Name: Dalton Obrien MRN: [id number] Date of Birth: January 31, 1941 Referring Provider (PT): Allene Dillon, NP    Encounter Date: 08/23/2019  PT End of Session - 08/23/19 0840    Visit Number  1    Number of Visits  17    Date for PT Re-Evaluation  10/19/19    Authorization Type  1    Authorization Time Period  of 10    PT Start Time  0841    PT Stop Time  0946    PT Time Calculation (min)  65 min    Activity Tolerance  Patient tolerated treatment well    Behavior During Therapy  [facility] for tasks assessed/performed       Past Medical History:  Diagnosis Date  . Asthma   . Cancer (HCC)    Basal Cell on Face  . CHF (congestive heart failure) ([place])   . 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  There were no vitals filed for this visit.   Subjective Assessment - 08/23/19 0841    Subjective  Low back: 4/10 L side (pt currently sitting) currently, 9/10 at worst for the past 2 months (this morning at 3 am, probably on his right side) at L low back and hip    Pertinent History  Low back, L hip and L knee pain. Pt states that the PT a few months ago helped. Bladder cancer returned this December 2020. [facility] removed cancer. Unable to do chemo due to weak heart. Currently on immunotherapy to increase immune system to fight cancer.  S1-L3 is sensitive but not pain. Does not feel right. No pain in lumbar area. Feels pain in L posterior iliac crest, L posterior greater trochanter, L illiotibial band (or L5 dermatome) as well as posterior L patella and L knee joint. Pain oscillates and is random. The pain feels like it is radiating from his low back. Wants to figure out where to stick the steroid injection. No pain with  walking 2-3 miles or when playing golf. Used to have pain in R side. Now his pain is in his L side. Also feels L groin pain/ache.  Felt like he might have overdone his home exercises. No back surgeries. Denies loss of bowel or bladder control.  Pt states that his L hip pain/ache keeps him from sleeping at night and could not get comfortable. Tylenol takes to edge of at times.  PA and nurse know about the ache in his L side that keeps him from sleeping.  Denies saddle anesthesia. No unexplained changes in weight. Pain is worsening everyday. L LE feels like it may buckle at any time.    Diagnostic tests  X-ray L hip in January 2021 which was negative    Patient Stated Goals  Be able to get back to where he was 3 months ago. No pain in lumbar area or sciatica.    Currently in Pain?  Yes    Pain Score  4     Pain Location  Back    Pain Type  Chronic pain    Pain Onset  More than a month ago    Pain Frequency  Occasional    Aggravating Factors   upright sitting, leaning back, leaning  forward, flat on his back, R or L side lying.    Pain Relieving Factors  walking 2-3 miles         [facility] PT Assessment - 08/23/19 0915      Assessment   Medical Diagnosis  Lumbar DDD, stenosis, with neurogenic claudication, L hip pain, chronic pain of L knee.     Referring Provider (PT)  Allene Dillon, NP    Onset Date/Surgical Date  08/14/19    Prior Therapy  Pt participated in PT for low back pain previously with positive results      Precautions   Precaution Comments  pacemaker, hx of CA      Restrictions   Other Position/Activity Restrictions  pacemaker related restrictions      Balance Screen   Has the patient fallen in the past 6 months  No    Has the patient had a decrease in activity level because of a fear of falling?   Yes    Is the patient reluctant to leave their home because of a fear of falling?   No      Prior Function   Vocation  Retired      Observation/Other Assessments   Focus on  Therapeutic Outcomes (FOTO)   Lumbar FOTO: 51       Posture/Postural Control   Posture Comments  Protracted neck, B protracted shoulders, L shoulder lower, decreased lumbar lordosis, R lumbar rotation, R pelvic rotation, slight R lumbar convexity, R greater trochanter higher, R femur ER      AROM   Lumbar Flexion  limited with reproduction of L low back pain with L L5 dermatome radiation     Lumbar Extension  WFL    Lumbar - Right Side Bend  WFL    Lumbar - Left Side Bend  WFL    Lumbar - Right Rotation  WFL    Lumbar - Left Rotation  WFL      PROM   Overall PROM Comments  Hip at 90/90: IR: R 15 degrees, L 22 degrees      Strength   Right Hip Flexion  4-/5    Right Hip Extension  3/5   seated manually resisted   Right Hip ABduction  4-/5   seated manually resisted clamshell   Left Hip Flexion  4-/5    Left Hip Extension  4-/5   seated manually resisted   Left Hip ABduction  4/5   seated manually resisted clamshell   Right Knee Flexion  4+/5    Right Knee Extension  4+/5    Left Knee Flexion  4+/5    Left Knee Extension  4+/5      Palpation   Palpation comment   R pelvic rotation, L lumbar rotation palpated in supine.       Special Tests   Other special tests  (-) Slump B. (+) long sit test suggesting anterior nutation L innominate.                 Objective measurements completed on examination: See above findings.   Aphasia Speak slowly facing patient R hand dominant  Pacemaker/Defibrillator   MedbridgeAccess Code: [email or web address]   Has been doing his HEP Shoulder blade squeezes helps with scapular discomofrt.   Reproduced L knee joint pain with resisted clamshell    Patient is a 79 year old male who came to physical therapy secondary to low back, L hip, thigh, and knee pain. He also presents with limited B hip IR  ROM, bilateral hip weakness, altered posture, positive special test suggesting lumbopelvic involvement and difficulty performing functional  tasks such as transfers, and tolerating positions such as sitting upright as well as supine or S/L positions for sleep. Pt will benefit from skilled physical therapy services to address the aforementioned deficits.         PT Education - 08/23/19 1746    Education Details  plan of care    Person(s) Educated  Patient    Methods  Explanation    Comprehension  Verbalized understanding       PT Short Term Goals - 08/23/19 1716      PT SHORT TERM GOAL #1   Title  Pt will be independent with HEP in order to improve strength and decrease back, L hip, thigh and knee pain in order to improve pain-free function at home    Time  4    Period  Weeks    Status  New    Target Date  09/21/19        PT Long Term Goals - 08/23/19 1716      PT LONG TERM GOAL #1   Title  Patient will have a decrease in low back pain to 4/10 or less at worst to promote ability to sleep, as well as tolerate positions such as sitting, supine and S/L.    Baseline  9/10 low back pain at most for the past 2 months (08/23/2019)    Time  8    Period  Weeks    Status  New    Target Date  10/19/19      PT LONG TERM GOAL #2   Title  Patient will improve bilateral hip abduction and extension strength to promote ability to perform tasks with less back and hip pain .    Time  8    Period  Weeks    Status  New    Target Date  10/19/19      PT LONG TERM GOAL #3   Title  Patient will  improve bilaterl hip IR PROM by at least 8 degrees to promote ability to perform standing tasks with less hip and knee pain.    Baseline  Hip IR PROM: 15 degrees R, 22 degrees L (08/23/2019)    Time  8    Period  Weeks    Status  New    Target Date  10/19/19      PT LONG TERM GOAL #4   Title  Patient will improve his Lumbar FOTO score by at least 10 points as a demonstration of improved function.    Baseline  Lumbar FOTO: 51 (08/23/2019)    Time  8    Period  Weeks    Status  New    Target Date  10/19/19             Plan -  08/23/19 1259    Clinical Impression Statement  Patient is a 79 year old male who came to physical therapy secondary to low back, L hip, thigh, and knee pain. He also presents with limited B hip IR ROM, bilateral hip weakness, altered posture, positive special test suggesting lumbopelvic involvement and difficulty performing functional tasks such as transfers, and tolerating positions such as sitting upright as well as supine or S/L positions for sleep. Pt will benefit from skilled physical therapy services to address the aforementioned deficits.    Personal Factors and Comorbidities  Age;Comorbidity 3+;Time since onset of injury/illness/exacerbation;Past/Current Experience  Comorbidities  Basal cell CA, bladder CA, CHF    Examination-Activity Limitations  Sleep;Transfers;Sit    Stability/Clinical Decision Making  Evolving/Moderate complexity   pain is worsening per pt   Clinical Decision Making  Moderate    Rehab Potential  Fair    PT Frequency  2x / week    PT Duration  8 weeks    PT Treatment/Interventions  Therapeutic activities;Passive range of motion;Manual techniques;Patient/family education;Therapeutic exercise;Functional mobility training;Neuromuscular re-education    PT Next Visit Plan  Continue progress with HEP    Consulted and Agree with Plan of Care  Patient       Patient will benefit from skilled therapeutic intervention in order to improve the following deficits and impairments:  Postural dysfunction, Pain, Decreased activity tolerance, Decreased range of motion, Improper body mechanics, Decreased endurance, Decreased mobility  Visit Diagnosis: Chronic left-sided low back pain, unspecified whether sciatica present - Plan: PT plan of care cert/re-cert, CANCELED: PT plan of care cert/re-cert  Muscle weakness (generalized) - Plan: PT plan of care cert/re-cert, CANCELED: PT plan of care cert/re-cert  Pain in left hip - Plan: PT plan of care cert/re-cert  Left knee pain,  unspecified chronicity - Plan: PT plan of care cert/re-cert     Problem List Patient Active Problem List   Diagnosis Date Noted  . CVA (cerebral infarction) 08/21/2015  . TIA (transient ischemic attack) 08/20/2015  . Acute encephalopathy 08/20/2015    Joneen Boers PT, DPT   08/23/2019, 6:00 PM  [facility] [address] [place], [address] Phone: [phone]   Fax:  [phone]  Name: Dalton Obrien MRN: [id number] Date of Birth: 03/18/1941

## 2019-08-29 ENCOUNTER — Ambulatory Visit: Payer: Medicare HMO

## 2019-08-29 ENCOUNTER — Other Ambulatory Visit: Payer: Self-pay

## 2019-08-29 DIAGNOSIS — M25552 Pain in left hip: Secondary | ICD-10-CM

## 2019-08-29 DIAGNOSIS — M545 Low back pain, unspecified: Secondary | ICD-10-CM

## 2019-08-29 DIAGNOSIS — M6281 Muscle weakness (generalized): Secondary | ICD-10-CM

## 2019-08-29 DIAGNOSIS — G8929 Other chronic pain: Secondary | ICD-10-CM

## 2019-08-29 DIAGNOSIS — M25562 Pain in left knee: Secondary | ICD-10-CM

## 2019-08-29 NOTE — Therapy (Signed)
[facility] [address] [place], [address] Phone: [phone]   Fax:  [phone]  Physical Therapy Treatment  Patient Details  Name: Dalton Obrien MRN: [id number] Date of Birth: 04-30-1941 Referring Provider (PT): Allene Dillon, NP   Encounter Date: 08/29/2019  PT End of Session - 08/29/19 0901    Visit Number  2    Number of Visits  17    Date for PT Re-Evaluation  10/19/19    Authorization Type  2    Authorization Time Period  of 10    PT Start Time  0901    PT Stop Time  0948    PT Time Calculation (min)  47 min    Activity Tolerance  Patient tolerated treatment well    Behavior During Therapy  [facility] for tasks assessed/performed       Past Medical History:  Diagnosis Date  . Asthma   . Cancer (HCC)    Basal Cell on Face  . CHF (congestive heart failure) ([place])   . 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  There were no vitals filed for this visit.  Subjective Assessment - 08/29/19 0904    Subjective  Does his exercises in the morning. The supine lower trunk rotation bothers his back. Does not ease off quickly.  Starting to have R hip pain too. Pt takes Tylenol regularly. 1.5/10 low back and L hip pain currently in sitting. 2/10 R hip pain with gait. R hip pain just started about 2 days ago.    Pertinent History  Low back, L hip and L knee pain. Pt states that the PT a few months ago helped. Bladder cancer returned this December 2020. [facility] removed cancer. Unable to do chemo due to weak heart. Currently on immunotherapy to increase immune system to fight cancer.  S1-L3 is sensitive but not pain. Does not feel right. No pain in lumbar area. Feels pain in L posterior iliac crest, L posterior greater trochanter, L illiotibial band (or L5 dermatome) as well as posterior L patella and L knee joint. Pain oscillates and is random.  The pain feels like it is radiating from his low back. Wants to figure out where to stick the steroid injection. No pain with walking 2-3 miles or when playing golf. Used to have pain in R side. Now his pain is in his L side. Also feels L groin pain/ache.  Felt like he might have overdone his home exercises. No back surgeries. Denies loss of bowel or bladder control.  Pt states that his L hip pain/ache keeps him from sleeping at night and could not get comfortable. Tylenol takes to edge of at times.  PA and nurse know about the ache in his L side that keeps him from sleeping.  Denies saddle anesthesia. No unexplained changes in weight. Pain is worsening everyday. L LE feels like it may buckle at any time.    Diagnostic tests  X-ray L hip in January 2021 which was negative    Patient Stated Goals  Be able to get back to where he was 3 months ago. No pain in lumbar area or sciatica.    Currently in Pain?  Yes    Pain Score  2     Pain Onset  More than a month ago  PT Education - 08/29/19 [phone]    Education Details  ther-ex, HEP modification    Person(s) Educated  Patient    Methods  Explanation;Demonstration;Tactile cues;Verbal cues    Comprehension  Returned demonstration;Verbalized understanding      Objective   Aphasia Speak slowly facing patient R hand dominant Pacemaker/Defibrillator   MedbridgeAccess Code: [email or web address]   Has been doing his HEP Shoulder blade squeezes helps with scapular discomfort.   Reproduced L knee joint pain with resisted clamshell   Therapeutic exercise  Reviewed HEP. Held off lower trunk rotation secondary to low back irritation.   Seated L hip extension isometrics 10x5 seconds for 3 sets   Decreased R and L hip pain during gait   Supine hip IR stretch with PT   R 2x  L 2x  Increased hold time (no specific time recorded... held until stretch felt; stiff B hips)  to promote IR ROM. Improved B  hip IR PROM afterwards  Supine hip IR PROM at 90/90  R 10x3  L 10x3    Improved exercise technique, movement at target joints, use of target muscles after mod verbal, visual, tactile cues.   Response to treatment Decreased bilateral hip pain with gait after session   Clinical impression Pt demonstrates bilateral hip stiffness which was addressed with IR stretches. Decreased B hip pain with gait after treatment. Pt will benefit from continued skilled physical therapy services to decrease pain, stiffness, improve strength, and function.         PT Short Term Goals - 08/23/19 1716      PT SHORT TERM GOAL #1   Title  Pt will be independent with HEP in order to improve strength and decrease back, L hip, thigh and knee pain in order to improve pain-free function at home    Time  4    Period  Weeks    Status  New    Target Date  09/21/19        PT Long Term Goals - 08/23/19 1716      PT LONG TERM GOAL #1   Title  Patient will have a decrease in low back pain to 4/10 or less at worst to promote ability to sleep, as well as tolerate positions such as sitting, supine and S/L.    Baseline  9/10 low back pain at most for the past 2 months (08/23/2019)    Time  8    Period  Weeks    Status  New    Target Date  10/19/19      PT LONG TERM GOAL #2   Title  Patient will improve bilateral hip abduction and extension strength to promote ability to perform tasks with less back and hip pain .    Time  8    Period  Weeks    Status  New    Target Date  10/19/19      PT LONG TERM GOAL #3   Title  Patient will  improve bilaterl hip IR PROM by at least 8 degrees to promote ability to perform standing tasks with less hip and knee pain.    Baseline  Hip IR PROM: 15 degrees R, 22 degrees L (08/23/2019)    Time  8    Period  Weeks    Status  New    Target Date  10/19/19      PT LONG TERM GOAL #4   Title  Patient will improve his Lumbar FOTO score by at least 10 points  as a demonstration  of improved function.    Baseline  Lumbar FOTO: 51 (08/23/2019)    Time  8    Period  Weeks    Status  New    Target Date  10/19/19            Plan - 08/29/19 [id number]    Clinical Impression Statement  Pt demonstrates bilateral hip stiffness which was addressed with IR stretches. Decreased B hip pain with gait after treatment. Pt will benefit from continued skilled physical therapy services to decrease pain, stiffness, improve strength, and function.    Personal Factors and Comorbidities  Age;Comorbidity 3+;Time since onset of injury/illness/exacerbation;Past/Current Experience    Comorbidities  Basal cell CA, bladder CA, CHF    Examination-Activity Limitations  Sleep;Transfers;Sit    Stability/Clinical Decision Making  Evolving/Moderate complexity   pain is worsening per pt   Rehab Potential  Fair    PT Frequency  2x / week    PT Duration  8 weeks    PT Treatment/Interventions  Therapeutic activities;Passive range of motion;Manual techniques;Patient/family education;Therapeutic exercise;Functional mobility training;Neuromuscular re-education    PT Next Visit Plan  Continue progress with HEP    Consulted and Agree with Plan of Care  Patient       Patient will benefit from skilled therapeutic intervention in order to improve the following deficits and impairments:  Postural dysfunction, Pain, Decreased activity tolerance, Decreased range of motion, Improper body mechanics, Decreased endurance, Decreased mobility  Visit Diagnosis: Chronic left-sided low back pain, unspecified whether sciatica present  Muscle weakness (generalized)  Pain in left hip  Left knee pain, unspecified chronicity     Problem List Patient Active Problem List   Diagnosis Date Noted  . CVA (cerebral infarction) 08/21/2015  . TIA (transient ischemic attack) 08/20/2015  . Acute encephalopathy 08/20/2015    Sanjuan Sawa 08/29/2019, 2:18 PM  [facility] [place] [facility] [address] [place], [address] Phone: [phone]   Fax:  [phone]  Name: KEYVAN DEVEAUX MRN: [id number] Date of Birth: 09-Feb-1941

## 2019-08-30 ENCOUNTER — Ambulatory Visit: Payer: Medicare HMO

## 2019-08-31 ENCOUNTER — Ambulatory Visit: Payer: Medicare HMO

## 2019-08-31 ENCOUNTER — Other Ambulatory Visit: Payer: Self-pay

## 2019-08-31 DIAGNOSIS — M25562 Pain in left knee: Secondary | ICD-10-CM

## 2019-08-31 DIAGNOSIS — M6281 Muscle weakness (generalized): Secondary | ICD-10-CM

## 2019-08-31 DIAGNOSIS — G8929 Other chronic pain: Secondary | ICD-10-CM

## 2019-08-31 DIAGNOSIS — M25552 Pain in left hip: Secondary | ICD-10-CM

## 2019-08-31 DIAGNOSIS — M545 Low back pain: Secondary | ICD-10-CM | POA: Diagnosis not present

## 2019-08-31 NOTE — Therapy (Signed)
[facility] [address], [place], [address] Phone: [phone]   Fax:  [phone]  Physical Therapy Treatment  Patient Details  Name: Dalton Obrien MRN: [id number] Date of Birth: [birth year] Referring Provider (PT): Allene Dillon, NP    Encounter Date: 08/31/2019  PT End of Session - 08/31/19 0902    Visit Number  3    Number of Visits  17    Date for PT Re-Evaluation  10/19/19    Authorization Type  3    Authorization Time Period  of 10    PT Start Time  0902    PT Stop Time  0946    PT Time Calculation (min)  44 min    Activity Tolerance  Patient tolerated treatment well    Behavior During Therapy  [facility] for tasks assessed/performed       Past Medical History:  Diagnosis Date  . Asthma   . Cancer (HCC)    Basal Cell on Face  . CHF (congestive heart failure) ([place])   . 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  There were no vitals filed for this visit.  Subjective Assessment - 08/31/19 0904    Subjective  Low back is good. The hip treatment after last session helped loosen him up and felt good until last night around 8 pm. L hip pain got up to 5/10 yesterday but since after treatment to last night, his L hip pain at most was only about 1-2/10.    Pertinent History  Low back, L hip and L knee pain. Pt states that the PT a few months ago helped. Bladder cancer returned this December 2020. [facility] removed cancer. Unable to do chemo due to weak heart. Currently on immunotherapy to increase immune system to fight cancer.  S1-L3 is sensitive but not pain. Does not feel right. No pain in lumbar area. Feels pain in L posterior iliac crest, L posterior greater trochanter, L illiotibial band (or L5 dermatome) as well as posterior L patella and L knee joint. Pain oscillates and is random. The pain feels like it is radiating from his low  back. Wants to figure out where to stick the steroid injection. No pain with walking 2-3 miles or when playing golf. Used to have pain in R side. Now his pain is in his L side. Also feels L groin pain/ache.  Felt like he might have overdone his home exercises. No back surgeries. Denies loss of bowel or bladder control.  Pt states that his L hip pain/ache keeps him from sleeping at night and could not get comfortable. Tylenol takes to edge of at times.  PA and nurse know about the ache in his L side that keeps him from sleeping.  Denies saddle anesthesia. No unexplained changes in weight. Pain is worsening everyday. L LE feels like it may buckle at any time.    Diagnostic tests  X-ray L hip in January 2021 which was negative    Patient Stated Goals  Be able to get back to where he was 3 months ago. No pain in lumbar area or sciatica.    Currently in Pain?  Yes    Pain Score  2    1.5/10 L hip   Pain Onset  More than a month ago  PT Education - 08/31/19 1244    Education Details  ther-ex    Person(s) Educated  Patient    Methods  Explanation;Demonstration;Tactile cues;Verbal cues    Comprehension  Verbalized understanding;Returned demonstration      Objective   Aphasia Speak slowly facing patient R hand dominant Pacemaker/Defibrillator  MedbridgeAccess Code: [email or web address]   Has been doing his HEP Shoulder blade squeezes helps with scapular discomfort.   Reproduced L knee joint pain with resisted clamshell   Therapeutic exercise  Reviewed HEP. Held off seated thoracic extension on chair from HEP.    Standing posture: R lumbar convexity, R lumbar rotation    Seated manually resisted R trunk rotation to promote L lumbar rotation 10x5 seconds fpr 3 sets  PT L hand on pt L arm  Improved more neutral low back posture observed  Decreased L low back pain  Seated manually resisted L trunk flexion isometrics 10x5 seconds for 3  sets  Seated L hip extension isometrics 10x with 5 second holds   Seated R hip extension isometrics 10 x 5 second holds  More neutral low back posture observed, decreased R lumbar rotation  Decreased back pain   Seated hip adduction pillow squeeze with R hip IR 3x  L hip IR increased lateral hip discomfort at greater trochanter  Seated AAROM hip IR with PT     L 10x   Supine hip IR PROM at 90/90            R 10x             L 10x       Improved exercise technique, movement at target joints, use of target muscles after mod verbal, visual, tactile cues.   Response to treatment Decreased L low back pain with treatment.    Clinical impression  Decreased L low back pain at iliac area after tretment to decrease R trunk rotation. Pt presents with L lateral hip pain, TTP around greater trochanter area, and increased pain with activation of L glute min muscle. Possible glute min tendon involvement with pain. Pt will benefit from continued skilled physical therapy services to decrease pain, improve strength and function.         PT Short Term Goals - 08/23/19 1716      PT SHORT TERM GOAL #1   Title  Pt will be independent with HEP in order to improve strength and decrease back, L hip, thigh and knee pain in order to improve pain-free function at home    Time  4    Period  Weeks    Status  New    Target Date  09/21/19        PT Long Term Goals - 08/23/19 1716      PT LONG TERM GOAL #1   Title  Patient will have a decrease in low back pain to 4/10 or less at worst to promote ability to sleep, as well as tolerate positions such as sitting, supine and S/L.    Baseline  9/10 low back pain at most for the past 2 months (08/23/2019)    Time  8    Period  Weeks    Status  New    Target Date  10/19/19      PT LONG TERM GOAL #2   Title  Patient will improve bilateral hip abduction and extension strength to promote ability to perform tasks with less back and hip  pain .    Time  8  Period  Weeks    Status  New    Target Date  10/19/19      PT LONG TERM GOAL #3   Title  Patient will  improve bilaterl hip IR PROM by at least 8 degrees to promote ability to perform standing tasks with less hip and knee pain.    Baseline  Hip IR PROM: 15 degrees R, 22 degrees L (08/23/2019)    Time  8    Period  Weeks    Status  New    Target Date  10/19/19      PT LONG TERM GOAL #4   Title  Patient will improve his Lumbar FOTO score by at least 10 points as a demonstration of improved function.    Baseline  Lumbar FOTO: 51 (08/23/2019)    Time  8    Period  Weeks    Status  New    Target Date  10/19/19            Plan - 08/31/19 1244    Clinical Impression Statement  Decreased L low back pain at iliac area after tretment to decrease R trunk rotation. Pt presents with L lateral hip pain, TTP around greater trochanter area, and increased pain with activation of L glute min muscle. Possible glute min tendon involvement with pain. Pt will benefit from continued skilled physical therapy services to decrease pain, improve strength and function.    Personal Factors and Comorbidities  Age;Comorbidity 3+;Time since onset of injury/illness/exacerbation;Past/Current Experience    Comorbidities  Basal cell CA, bladder CA, CHF    Examination-Activity Limitations  Sleep;Transfers;Sit    Stability/Clinical Decision Making  Evolving/Moderate complexity   pain is worsening per pt   Rehab Potential  Fair    PT Frequency  2x / week    PT Duration  8 weeks    PT Treatment/Interventions  Therapeutic activities;Passive range of motion;Manual techniques;Patient/family education;Therapeutic exercise;Functional mobility training;Neuromuscular re-education    PT Next Visit Plan  Continue progress with HEP    Consulted and Agree with Plan of Care  Patient       Patient will benefit from skilled therapeutic intervention in order to improve the following deficits and  impairments:  Postural dysfunction, Pain, Decreased activity tolerance, Decreased range of motion, Improper body mechanics, Decreased endurance, Decreased mobility  Visit Diagnosis: Chronic left-sided low back pain, unspecified whether sciatica present  Muscle weakness (generalized)  Pain in left hip  Left knee pain, unspecified chronicity     Problem List Patient Active Problem List   Diagnosis Date Noted  . CVA (cerebral infarction) 08/21/2015  . TIA (transient ischemic attack) 08/20/2015  . Acute encephalopathy 08/20/2015   Joneen Boers PT, DPT   08/31/2019, 12:50 PM  [facility] [address] [place], [address] Phone: [phone]   Fax:  [phone]  Name: Dalton Obrien MRN: [id number] Date of Birth: 1940-09-12

## 2019-09-04 ENCOUNTER — Other Ambulatory Visit: Payer: Self-pay

## 2019-09-04 ENCOUNTER — Ambulatory Visit: Payer: Medicare HMO | Attending: Student

## 2019-09-04 DIAGNOSIS — M25562 Pain in left knee: Secondary | ICD-10-CM | POA: Insufficient documentation

## 2019-09-04 DIAGNOSIS — M25512 Pain in left shoulder: Secondary | ICD-10-CM | POA: Insufficient documentation

## 2019-09-04 DIAGNOSIS — M545 Low back pain, unspecified: Secondary | ICD-10-CM

## 2019-09-04 DIAGNOSIS — M6281 Muscle weakness (generalized): Secondary | ICD-10-CM

## 2019-09-04 DIAGNOSIS — M25552 Pain in left hip: Secondary | ICD-10-CM

## 2019-09-04 DIAGNOSIS — M542 Cervicalgia: Secondary | ICD-10-CM | POA: Insufficient documentation

## 2019-09-04 DIAGNOSIS — G8929 Other chronic pain: Secondary | ICD-10-CM | POA: Diagnosis present

## 2019-09-04 DIAGNOSIS — M5412 Radiculopathy, cervical region: Secondary | ICD-10-CM | POA: Insufficient documentation

## 2019-09-04 NOTE — Patient Instructions (Signed)
  Lying on your back on your bed with your knees bent   Belt around thighs, which are shoulder width apart,    Press knees out against belt with 40 % effort for 1 minute   Rest for 1-2 minutes   Perform 5 times per set.     Do 3 sets per day. Each set to be performed a few hours apart.

## 2019-09-04 NOTE — Therapy (Signed)
[facility] [address], [place], [address] Phone: [phone]   Fax:  [phone]  Physical Therapy Treatment  Patient Details  Name: Dalton Obrien MRN: [id number] Date of Birth: 11-11-1940 Referring Provider (PT): Allene Dillon, NP   Encounter Date: 09/04/2019  PT End of Session - 09/04/19 1038    Visit Number  4    Number of Visits  17    Date for PT Re-Evaluation  10/19/19    Authorization Type  4    Authorization Time Period  of 10    PT Start Time  1038    PT Stop Time  1120    PT Time Calculation (min)  42 min    Activity Tolerance  Patient tolerated treatment well    Behavior During Therapy  [facility] for tasks assessed/performed       Past Medical History:  Diagnosis Date  . Asthma   . Cancer (HCC)    Basal Cell on Face  . CHF (congestive heart failure) ([place])   . 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  There were no vitals filed for this visit.  Subjective Assessment - 09/04/19 1039    Subjective  Low back is the same. Slightly sensitive, not pain, not ache. L hip for the past 3 days, has been anywhere between 0-4/10. Had a longer period of no L hip pain.  Still has L lateral hip pain. Takes Tylenol regular strength around every 4 hours except during PT.    Pertinent History  Low back, L hip and L knee pain. Pt states that the PT a few months ago helped. Bladder cancer returned this December 2020. [facility] removed cancer. Unable to do chemo due to weak heart. Currently on immunotherapy to increase immune system to fight cancer.  S1-L3 is sensitive but not pain. Does not feel right. No pain in lumbar area. Feels pain in L posterior iliac crest, L posterior greater trochanter, L illiotibial band (or L5 dermatome) as well as posterior L patella and L knee joint. Pain oscillates and is random. The pain feels like it is radiating  from his low back. Wants to figure out where to stick the steroid injection. No pain with walking 2-3 miles or when playing golf. Used to have pain in R side. Now his pain is in his L side. Also feels L groin pain/ache.  Felt like he might have overdone his home exercises. No back surgeries. Denies loss of bowel or bladder control.  Pt states that his L hip pain/ache keeps him from sleeping at night and could not get comfortable. Tylenol takes to edge of at times.  PA and nurse know about the ache in his L side that keeps him from sleeping.  Denies saddle anesthesia. No unexplained changes in weight. Pain is worsening everyday. L LE feels like it may buckle at any time.    Diagnostic tests  X-ray L hip in January 2021 which was negative    Patient Stated Goals  Be able to get back to where he was 3 months ago. No pain in lumbar area or sciatica.    Currently in Pain?  Yes    Pain Score  2    L latearl hip.   Pain Onset  More than a month ago  PT Education - 09/04/19 1057    Education Details  ther-ex, HEP    Person(s) Educated  Patient    Methods  Explanation;Demonstration;Tactile cues;Verbal cues;Handout    Comprehension  Returned demonstration;Verbalized understanding       Objective   Aphasia Speak slowly facing patient R hand dominant Pacemaker/Defibrillator  MedbridgeAccess Code: [email or web address]   Has been doing his HEP Shoulder blade squeezes helps with scapular discomfort.   Reproduced L knee joint pain with resisted clamshell    Therapeutic exercise  TTP L greater trochanter   Hooklying clamshell isometrics in neutral, 40% effort x 1 min with 1 minute rest break 5x  Increased time secondary to instructions and hold times  Reviewed and given as part of his HEP. Pt demonstrated and verbalized understanding. Handout provided.   Supine hip IRPROMat 90/90 to decrease stiffness and improve mobility R  10x3 L 10x3   Seated manually resisted R trunk rotation to promote L lumbar rotation 10x5 seconds for 3 sets             PT L hand on pt L arm             Seated manually resisted L trunk side bend isometrics 10x5 seconds for 2 sets     Improved exercise technique, movement at target joints, use of target muscles after mod verbal, visual, tactile cues.  Response to treatment Decreased L low back pain with treatment.  Decreased L hip pain after low back posture treatment.    Clinical impression  Pt demonstrates TTP to L greater trochanter suggesting glute med tendon involvement. Worked on isometric muscle activation to help address. Continued working on decreasing R lumbar rotation, and R side bend posture to promote more neutral lumbopelvic position. Decreased L hip pain after session. Pt will benefit from continued skilled physical therapy services to decrease pain, improve strength, and function.       PT Short Term Goals - 08/23/19 1716      PT SHORT TERM GOAL #1   Title  Pt will be independent with HEP in order to improve strength and decrease back, L hip, thigh and knee pain in order to improve pain-free function at home    Time  4    Period  Weeks    Status  New    Target Date  09/21/19        PT Long Term Goals - 08/23/19 1716      PT LONG TERM GOAL #1   Title  Patient will have a decrease in low back pain to 4/10 or less at worst to promote ability to sleep, as well as tolerate positions such as sitting, supine and S/L.    Baseline  9/10 low back pain at most for the past 2 months (08/23/2019)    Time  8    Period  Weeks    Status  New    Target Date  10/19/19      PT LONG TERM GOAL #2   Title  Patient will improve bilateral hip abduction and extension strength to promote ability to perform tasks with less back and hip pain .    Time  8    Period  Weeks    Status  New    Target Date  10/19/19      PT LONG TERM GOAL #3   Title   Patient will  improve bilaterl hip IR PROM by at least 8 degrees to promote ability to perform standing tasks with  less hip and knee pain.    Baseline  Hip IR PROM: 15 degrees R, 22 degrees L (08/23/2019)    Time  8    Period  Weeks    Status  New    Target Date  10/19/19      PT LONG TERM GOAL #4   Title  Patient will improve his Lumbar FOTO score by at least 10 points as a demonstration of improved function.    Baseline  Lumbar FOTO: 51 (08/23/2019)    Time  8    Period  Weeks    Status  New    Target Date  10/19/19            Plan - 09/04/19 1059    Clinical Impression Statement  Pt demonstrates TTP to L greater trochanter suggesting glute med tendon involvement. Worked on isometric muscle activation to help address. Continued working on decreasing R lumbar rotation, and R side bend posture to promote more neutral lumbopelvic position. Decreased L hip pain after session. Pt will benefit from continued skilled physical therapy services to decrease pain, improve strength, and function.    Personal Factors and Comorbidities  Age;Comorbidity 3+;Time since onset of injury/illness/exacerbation;Past/Current Experience    Comorbidities  Basal cell CA, bladder CA, CHF    Examination-Activity Limitations  Sleep;Transfers;Sit    Stability/Clinical Decision Making  Evolving/Moderate complexity   pain is worsening per pt   Rehab Potential  Fair    PT Frequency  2x / week    PT Duration  8 weeks    PT Treatment/Interventions  Therapeutic activities;Passive range of motion;Manual techniques;Patient/family education;Therapeutic exercise;Functional mobility training;Neuromuscular re-education    PT Next Visit Plan  Continue progress with HEP    Consulted and Agree with Plan of Care  Patient       Patient will benefit from skilled therapeutic intervention in order to improve the following deficits and impairments:  Postural dysfunction, Pain, Decreased activity tolerance, Decreased range of  motion, Improper body mechanics, Decreased endurance, Decreased mobility  Visit Diagnosis: Chronic left-sided low back pain, unspecified whether sciatica present  Muscle weakness (generalized)  Pain in left hip  Left knee pain, unspecified chronicity     Problem List Patient Active Problem List   Diagnosis Date Noted  . CVA (cerebral infarction) 08/21/2015  . TIA (transient ischemic attack) 08/20/2015  . Acute encephalopathy 08/20/2015    Joneen Boers PT, DPT   09/04/2019, 12:32 PM  [facility] [address] [place], [address] Phone: [phone]   Fax:  [phone]  Name: SHARONE AGNE MRN: [id number] Date of Birth: 1940-08-28

## 2019-09-06 ENCOUNTER — Ambulatory Visit: Payer: Medicare HMO

## 2019-09-06 ENCOUNTER — Other Ambulatory Visit: Payer: Self-pay

## 2019-09-06 DIAGNOSIS — M6281 Muscle weakness (generalized): Secondary | ICD-10-CM

## 2019-09-06 DIAGNOSIS — G8929 Other chronic pain: Secondary | ICD-10-CM

## 2019-09-06 DIAGNOSIS — M545 Low back pain, unspecified: Secondary | ICD-10-CM

## 2019-09-06 DIAGNOSIS — M25562 Pain in left knee: Secondary | ICD-10-CM

## 2019-09-06 DIAGNOSIS — M25552 Pain in left hip: Secondary | ICD-10-CM

## 2019-09-06 NOTE — Therapy (Signed)
[name] [address], [place], [address] Phone: [phone]   Fax:  [phone]  Physical Therapy Treatment  Patient Details  Name: Dalton Obrien MRN: [id number]  Date of Birth: 1941/01/31 Referring Provider (PT): Allene Dillon, NP   Encounter Date: 09/06/2019  PT End of Session - 09/06/19 0816    Visit Number  5    Number of Visits  17    Date for PT Re-Evaluation  10/19/19    Authorization Type  5    Authorization Time Period  of 10    PT Start Time  0816    PT Stop Time  0859    PT Time Calculation (min)  43 min    Activity Tolerance  Patient tolerated treatment well    Behavior During Therapy  [facility] for tasks assessed/performed       Past Medical History:  Diagnosis Date  . Asthma   . Cancer (HCC)    Basal Cell on Face  . CHF (congestive heart failure) ([place])   . 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  There were no vitals filed for this visit.  Subjective Assessment - 09/06/19 0817    Subjective  Lumbar area is sensitive but no pain. L hip is better I think the belt exercise and the pillow helped him. Walked 2 miles and played golf yesterday. Was fine during the walk but had to take tylenol yesterday night. L shoulder is beginning to hurt again. Neck does not hurt at all. Pt was told that his neck is worse than his back but neck has no pain but pops randomly and frequently. L knee joint is 1.5/10, L hip is 1.5/10 at rest. It's all getting better but very slowly.    Pertinent History  Low back, L hip and L knee pain. Pt states that the PT a few months ago helped. Bladder cancer returned this December 2020. [facility] removed cancer. Unable to do chemo due to weak heart. Currently on immunotherapy to increase immune system to fight cancer.  S1-L3 is sensitive but not pain. Does not feel right. No pain in lumbar area. Feels  pain in L posterior iliac crest, L posterior greater trochanter, L illiotibial band (or L5 dermatome) as well as posterior L patella and L knee joint. Pain oscillates and is random. The pain feels like it is radiating from his low back. Wants to figure out where to stick the steroid injection. No pain with walking 2-3 miles or when playing golf. Used to have pain in R side. Now his pain is in his L side. Also feels L groin pain/ache.  Felt like he might have overdone his home exercises. No back surgeries. Denies loss of bowel or bladder control.  Pt states that his L hip pain/ache keeps him from sleeping at night and could not get comfortable. Tylenol takes to edge of at times.  PA and nurse know about the ache in his L side that keeps him from sleeping.  Denies saddle anesthesia. No unexplained changes in weight. Pain is worsening everyday. L LE feels like it may buckle at any time.    Diagnostic tests  X-ray L hip in January 2021 which was negative    Patient Stated Goals  Be able to get back to where he was 3 months ago. No pain in lumbar area or sciatica.  Currently in Pain?  Yes    Pain Score  2    1.5/10 L hip.   Pain Onset  More than a month ago                               PT Education - 09/06/19 0825    Education Details  ther-ex    Person(s) Educated  Patient    Methods  Explanation;Demonstration;Tactile cues;Verbal cues    Comprehension  Returned demonstration;Verbalized understanding       Objective   Aphasia Speak slowly facing patient R hand dominant Pacemaker/Defibrillator  MedbridgeAccess Code: [email or web address]   Has been doing his HEP Shoulder blade squeezes helps with scapular discomfort.   Reproduced L knee joint pain with resisted clamshell    Therapeutic exercise  Seated manually resisted R trunk rotation to promote L lumbar rotation 10x5 seconds for 2 sets PT L hand on pt L arm  R lumbar paraspinal muscle  tension palpated   Seated manually resisted L trunkside bend isometrics 10x5 seconds for 3 sets  Decreased low back sensitivity afterwards  Decreased R lumbar paraspinal muscle tension palpated during exercise  Seated manually resisted trunk flexion isometrics, PT manually resisted 10x3 with 5 second holds  To decrease R lumbar paraspinal muscle tension   Supine hip IRPROMat 90/90 to decrease stiffness and improve mobility R 10x3 L 10x3   supine L hip IR stretch with PT 2x  No L hip or back pain afterwards  Improved exercise technique, movement at target joints, use of target muscles after mod verbal, visual, tactile cues.  Response to treatment Pt states back and L hip feels better after session reported by pt   Clinical impression  Decreased low back sensitivity with treatment to promote trunk strength and decreased L hip discomfort with treatment to promote hip mobility. Pt tolerated session well without aggravation of symptoms. Pt will benefit from continued skilled physical therapy services to decrease pain, improve strength and function.      PT Short Term Goals - 08/23/19 1716      PT SHORT TERM GOAL #1   Title  Pt will be independent with HEP in order to improve strength and decrease back, L hip, thigh and knee pain in order to improve pain-free function at home    Time  4    Period  Weeks    Status  New    Target Date  09/21/19        PT Long Term Goals - 08/23/19 1716      PT LONG TERM GOAL #1   Title  Patient will have a decrease in low back pain to 4/10 or less at worst to promote ability to sleep, as well as tolerate positions such as sitting, supine and S/L.    Baseline  9/10 low back pain at most for the past 2 months (08/23/2019)    Time  8    Period  Weeks    Status  New    Target Date  10/19/19      PT LONG TERM GOAL #2   Title  Patient will improve bilateral hip abduction and extension strength to promote  ability to perform tasks with less back and hip pain .    Time  8    Period  Weeks    Status  New    Target Date  10/19/19      PT LONG TERM GOAL #3  Title  Patient will  improve bilaterl hip IR PROM by at least 8 degrees to promote ability to perform standing tasks with less hip and knee pain.    Baseline  Hip IR PROM: 15 degrees R, 22 degrees L (08/23/2019)    Time  8    Period  Weeks    Status  New    Target Date  10/19/19      PT LONG TERM GOAL #4   Title  Patient will improve his Lumbar FOTO score by at least 10 points as a demonstration of improved function.    Baseline  Lumbar FOTO: 51 (08/23/2019)    Time  8    Period  Weeks    Status  New    Target Date  10/19/19            Plan - 09/06/19 0829    Clinical Impression Statement  Decreased low back sensitivity with treatment to promote trunk strength and decreased L hip discomfort with treatment to promote hip mobility. Pt tolerated session well without aggravation of symptoms. Pt will benefit from continued skilled physical therapy services to decrease pain, improve strength and function.    Personal Factors and Comorbidities  Age;Comorbidity 3+;Time since onset of injury/illness/exacerbation;Past/Current Experience    Comorbidities  Basal cell CA, bladder CA, CHF    Examination-Activity Limitations  Sleep;Transfers;Sit    Stability/Clinical Decision Making  Evolving/Moderate complexity   pain is worsening per pt   Rehab Potential  Fair    PT Frequency  2x / week    PT Duration  8 weeks    PT Treatment/Interventions  Therapeutic activities;Passive range of motion;Manual techniques;Patient/family education;Therapeutic exercise;Functional mobility training;Neuromuscular re-education    PT Next Visit Plan  Continue progress with HEP    Consulted and Agree with Plan of Care  Patient       Patient will benefit from skilled therapeutic intervention in order to improve the following deficits and impairments:  Postural  dysfunction, Pain, Decreased activity tolerance, Decreased range of motion, Improper body mechanics, Decreased endurance, Decreased mobility  Visit Diagnosis: Chronic left-sided low back pain, unspecified whether sciatica present  Muscle weakness (generalized)  Pain in left hip  Left knee pain, unspecified chronicity     Problem List Patient Active Problem List   Diagnosis Date Noted  . CVA (cerebral infarction) 08/21/2015  . TIA (transient ischemic attack) 08/20/2015  . Acute encephalopathy 08/20/2015   Joneen Boers PT, DPT   09/06/2019, 10:19 AM  [facility] [address], [place], [address] Phone: [phone]   Fax:  [phone]  Name: Dalton Obrien MRN: [id number] Date of Birth: 1940-08-04

## 2019-09-11 ENCOUNTER — Other Ambulatory Visit: Payer: Self-pay

## 2019-09-11 ENCOUNTER — Ambulatory Visit: Payer: Medicare HMO

## 2019-09-11 DIAGNOSIS — M6281 Muscle weakness (generalized): Secondary | ICD-10-CM

## 2019-09-11 DIAGNOSIS — M25552 Pain in left hip: Secondary | ICD-10-CM

## 2019-09-11 DIAGNOSIS — M545 Low back pain, unspecified: Secondary | ICD-10-CM

## 2019-09-11 DIAGNOSIS — G8929 Other chronic pain: Secondary | ICD-10-CM

## 2019-09-11 DIAGNOSIS — M25562 Pain in left knee: Secondary | ICD-10-CM

## 2019-09-11 NOTE — Therapy (Signed)
[facility] [address], [place], [address] Phone: [phone]   Fax:  [phone]  Physical Therapy Treatment  Patient Details  Name: Dalton Obrien MRN: [id number] Date of Birth: [birth year] Referring Provider (PT): Allene Dillon, NP   Encounter Date: 09/11/2019  PT End of Session - 09/11/19 0851    Visit Number  6    Number of Visits  17    Date for PT Re-Evaluation  10/19/19    Authorization Type  6    Authorization Time Period  of 10    PT Start Time  0851    PT Stop Time  0935    PT Time Calculation (min)  44 min    Activity Tolerance  Patient tolerated treatment well    Behavior During Therapy  [facility] for tasks assessed/performed       Past Medical History:  Diagnosis Date  . Asthma   . Cancer (HCC)    Basal Cell on Face  . CHF (congestive heart failure) ([place])   . 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  There were no vitals filed for this visit.  Subjective Assessment - 09/11/19 0853    Subjective  Low back and L hip is very good. Has had 0/10 and not Tylenol for the past 2 days. Does his HEP every day BID. Plays golf in the afternoon if it does not rain. No L knee pain, groin pain, lateral thigh pain currently. Neck and L shoulder (posterior) seem to be bothering him.    Pertinent History  Low back, L hip and L knee pain. Pt states that the PT a few months ago helped. Bladder cancer returned this December 2020. [facility] removed cancer. Unable to do chemo due to weak heart. Currently on immunotherapy to increase immune system to fight cancer.  S1-L3 is sensitive but not pain. Does not feel right. No pain in lumbar area. Feels pain in L posterior iliac crest, L posterior greater trochanter, L illiotibial band (or L5 dermatome) as well as posterior L patella and L knee joint. Pain oscillates and is random. The pain feels like  it is radiating from his low back. Wants to figure out where to stick the steroid injection. No pain with walking 2-3 miles or when playing golf. Used to have pain in R side. Now his pain is in his L side. Also feels L groin pain/ache.  Felt like he might have overdone his home exercises. No back surgeries. Denies loss of bowel or bladder control.  Pt states that his L hip pain/ache keeps him from sleeping at night and could not get comfortable. Tylenol takes to edge of at times.  PA and nurse know about the ache in his L side that keeps him from sleeping.  Denies saddle anesthesia. No unexplained changes in weight. Pain is worsening everyday. L LE feels like it may buckle at any time.    Diagnostic tests  X-ray L hip in January 2021 which was negative    Patient Stated Goals  Be able to get back to where he was 3 months ago. No pain in lumbar area or sciatica.    Currently in Pain?  No/denies    Pain Score  0-No pain    Pain Onset  More than a month ago  PT Education - 09/11/19 0857    Education Details  ther-ex, HEP, plan of care    Person(s) Educated  Patient    Methods  Explanation;Demonstration;Tactile cues;Verbal cues;Handout    Comprehension  Returned demonstration;Verbalized understanding      Objective   Aphasia Speak slowly facing patient R hand dominant Pacemaker/Defibrillator  MedbridgeAccess Code: [email or web address]   Has been doing his HEP Shoulder blade squeezes helps with scapular discomfort.   Reproduced L knee joint pain with resisted clamshell    Therapeutic exercise   Discussed plan of care: contue PT for low back and L hip and work on neck and L shoulder with new order when pt graduates from current plan of care for low back and L hip.   Seated manually resisted R trunk rotation to promote L lumbar rotation 10x5 seconds for 2 sets PT L hand on pt L shoulder  Seated manually  resisted L trunkside bendisometrics 10x5 seconds for3sets           Seated manually resisted trunk flexion isometrics, PT manually resisted 10x with 5 second holds             To decrease R lumbar paraspinal muscle tension   supine L hip IR stretch with PT 2x           Supine hip IRPROMat 90/90to decrease stiffness and improve mobility R 10x3 L 10x3  Seated chin tucks to promote thoracic extension and decrease low back extension pressure 10x2 with 5 second holds. Reviewed and given as part of his HEP. Pt demonstrated and verbalized understanding. Handout provided.   FOTO next visit if appropriate       Improved exercise technique, movement at target joints, use of target muscles after mod verbal, visual, tactile cues.  Response to treatment Pt tolerated session well without aggravation of symptoms.    Clinical impression  No pain after session. Improving back and L hip pain and function based on subjective reports. Continued working on posture, trunk strengthening, thoracic extension, and hip IR ROM to continue progress. Pt tolerated session well without aggravation of symptoms. Pt will benefit from continued skilled physical therapy services to decrease pain, improve strength and function.      PT Short Term Goals - 08/23/19 1716      PT SHORT TERM GOAL #1   Title  Pt will be independent with HEP in order to improve strength and decrease back, L hip, thigh and knee pain in order to improve pain-free function at home    Time  4    Period  Weeks    Status  New    Target Date  09/21/19        PT Long Term Goals - 08/23/19 1716      PT LONG TERM GOAL #1   Title  Patient will have a decrease in low back pain to 4/10 or less at worst to promote ability to sleep, as well as tolerate positions such as sitting, supine and S/L.    Baseline  9/10 low back pain at most for the past 2 months (08/23/2019)    Time  8    Period  Weeks     Status  New    Target Date  10/19/19      PT LONG TERM GOAL #2   Title  Patient will improve bilateral hip abduction and extension strength to promote ability to perform tasks with less back and hip pain .    Time  8  Period  Weeks    Status  New    Target Date  10/19/19      PT LONG TERM GOAL #3   Title  Patient will  improve bilaterl hip IR PROM by at least 8 degrees to promote ability to perform standing tasks with less hip and knee pain.    Baseline  Hip IR PROM: 15 degrees R, 22 degrees L (08/23/2019)    Time  8    Period  Weeks    Status  New    Target Date  10/19/19      PT LONG TERM GOAL #4   Title  Patient will improve his Lumbar FOTO score by at least 10 points as a demonstration of improved function.    Baseline  Lumbar FOTO: 51 (08/23/2019)    Time  8    Period  Weeks    Status  New    Target Date  10/19/19            Plan - 09/11/19 0858    Clinical Impression Statement  No pain after session. Improving back and L hip pain and function based on subjective reports. Continued working on posture, trunk strengthening, thoracic extension, and hip IR ROM to continue progress. Pt tolerated session well without aggravation of symptoms. Pt will benefit from continued skilled physical therapy services to decrease pain, improve strength and function.    Personal Factors and Comorbidities  Age;Comorbidity 3+;Time since onset of injury/illness/exacerbation;Past/Current Experience    Comorbidities  Basal cell CA, bladder CA, CHF    Examination-Activity Limitations  Sleep;Transfers;Sit    Stability/Clinical Decision Making  Evolving/Moderate complexity   pain is worsening per pt   Rehab Potential  Fair    PT Frequency  2x / week    PT Duration  8 weeks    PT Treatment/Interventions  Therapeutic activities;Passive range of motion;Manual techniques;Patient/family education;Therapeutic exercise;Functional mobility training;Neuromuscular re-education    PT Next Visit Plan   Continue progress with HEP    Consulted and Agree with Plan of Care  Patient       Patient will benefit from skilled therapeutic intervention in order to improve the following deficits and impairments:  Postural dysfunction, Pain, Decreased activity tolerance, Decreased range of motion, Improper body mechanics, Decreased endurance, Decreased mobility  Visit Diagnosis: Chronic left-sided low back pain, unspecified whether sciatica present  Muscle weakness (generalized)  Pain in left hip  Left knee pain, unspecified chronicity     Problem List Patient Active Problem List   Diagnosis Date Noted  . CVA (cerebral infarction) 08/21/2015  . TIA (transient ischemic attack) 08/20/2015  . Acute encephalopathy 08/20/2015    Joneen Boers PT, DPT   09/11/2019, 2:37 PM  [facility] [address], [place], [address] Phone: [phone]   Fax:  [phone]  Name: VALLEN LINTHICUM MRN: [id number] Date of Birth: [birth year]

## 2019-09-11 NOTE — Patient Instructions (Signed)
Access Code: [email or web address] URL: [email or web address] Date: 09/11/2019 Prepared by: Joneen Boers Exercises Seated Hip Internal Rotation AROM - 5 reps - 6 sets - 2x daily - 7x weekly Seated Thoracic Lumbar Extension - 10 reps - 3 sets - 5 seconds hold - 1x daily - 7x weekly Standing Hip Abduction with Counter Support - 10 reps - 3 sets - 1x daily - 7x weekly Seated Cervical Retraction - 10 reps - 3 sets - 5 seconds hold - 1x daily - 7x weekly

## 2019-09-14 ENCOUNTER — Ambulatory Visit: Payer: Medicare HMO

## 2019-09-14 ENCOUNTER — Other Ambulatory Visit: Payer: Self-pay

## 2019-09-14 DIAGNOSIS — M545 Low back pain, unspecified: Secondary | ICD-10-CM

## 2019-09-14 DIAGNOSIS — G8929 Other chronic pain: Secondary | ICD-10-CM

## 2019-09-14 DIAGNOSIS — M25552 Pain in left hip: Secondary | ICD-10-CM

## 2019-09-14 DIAGNOSIS — M25562 Pain in left knee: Secondary | ICD-10-CM

## 2019-09-14 DIAGNOSIS — M6281 Muscle weakness (generalized): Secondary | ICD-10-CM

## 2019-09-14 NOTE — Therapy (Signed)
[facility] [address] [place], [address] Phone: [phone]   Fax:  [phone]  Physical Therapy Treatment And Discharge Summary  Patient Details  Name: Dalton Obrien MRN: [id number] Date of Birth: Mar 15, 1941 Referring Provider (PT): Allene Dillon, NP   Encounter Date: 09/14/2019  PT End of Session - 09/14/19 0932    Visit Number  7    Number of Visits  17    Date for PT Re-Evaluation  10/19/19    Authorization Type  7    Authorization Time Period  of 10    PT Start Time  0932    PT Stop Time  1026    PT Time Calculation (min)  54 min    Activity Tolerance  Patient tolerated treatment well    Behavior During Therapy  [facility] for tasks assessed/performed       Past Medical History:  Diagnosis Date  . Asthma   . Cancer (HCC)    Basal Cell on Face  . CHF (congestive heart failure) ([place])   . 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  There were no vitals filed for this visit.  Subjective Assessment - 09/14/19 0933    Subjective  No back pain and no L hip pain is 0/10 currently since Saturday. L shoulder is about the same, does not hurt unless he raises his L arm up to the side, as well as comb his hair, as well as reaching across. L medial shoulder blade bothers him occasionally. Played 18 holes of golf yesterday. Wonders if pinching of the shoulder blades contributes to the medial shoulder blade pain. Also did not take Tylenol for back for the past 5 days.  Feels like he can graduate PT for low back and L hip today and start on the L shoulder and neck.    Pertinent History  Low back, L hip and L knee pain. Pt states that the PT a few months ago helped. Bladder cancer returned this December 2020. [facility] removed cancer. Unable to do chemo due to weak heart. Currently on immunotherapy to increase immune system to fight cancer.   S1-L3 is sensitive but not pain. Does not feel right. No pain in lumbar area. Feels pain in L posterior iliac crest, L posterior greater trochanter, L illiotibial band (or L5 dermatome) as well as posterior L patella and L knee joint. Pain oscillates and is random. The pain feels like it is radiating from his low back. Wants to figure out where to stick the steroid injection. No pain with walking 2-3 miles or when playing golf. Used to have pain in R side. Now his pain is in his L side. Also feels L groin pain/ache.  Felt like he might have overdone his home exercises. No back surgeries. Denies loss of bowel or bladder control.  Pt states that his L hip pain/ache keeps him from sleeping at night and could not get comfortable. Tylenol takes to edge of at times.  PA and nurse know about the ache in his L side that keeps him from sleeping.  Denies saddle anesthesia. No unexplained changes in weight. Pain is worsening everyday. L LE feels like it may buckle at any time.    Diagnostic tests  X-ray L hip in January 2021 which was negative    Patient Stated Goals  Be able to get back to where he  was 3 months ago. No pain in lumbar area or sciatica.    Currently in Pain?  Yes    Pain Score  1    L shoulder pain (posterior shoulder and medial scapular pain)   Pain Onset  More than a month ago         [facility] PT Assessment - 09/14/19 0956      Strength   Right Hip Extension  4/5   seated manually resisted   Right Hip ABduction  4+/5   seated manually resisted clamshell   Left Hip Extension  4/5   seated manually resisted   Left Hip ABduction  4+/5   seated manually resisted clamshell                          PT Education - 09/14/19 0948    Education Details  ther-ex    Person(s) Educated  Patient    Methods  Explanation;Demonstration;Tactile cues;Verbal cues    Comprehension  Returned demonstration;Verbalized understanding       Objective   Aphasia Speak slowly facing  patient R hand dominant Pacemaker/Defibrillator  MedbridgeAccess Code: [email or web address]   Has been doing his HEP Shoulder blade squeezes helps with scapular discomfort.   Reproduced L knee joint pain with resisted clamshell    Therapeutic exercise  Cervical flexion: reproduces L medial scapular pain  Reviewed HEP and plan of care: Graduate PT and start on working on neck and shoulder blade  Supine hip IR PROM   R: 20 degrees  L: 30 degrees   supine R hip IR stretch with PT 3x 1 minute   Supine hip IRPROMat 90/90to decrease stiffness and improve mobility R 10x3 L 10x3  Seated manually resisted hip extension and abduction   1-2x each way for each LE.  Reviewed progress with strength with pt.   Seated manually resisted L trunkside bendisometrics 10x5 seconds for3sets  Seated manually resisted trunk flexion isometrics, PT manually resisted 10x with 5 second holds To decrease R lumbar paraspinal muscle tension    Improved exercise technique, movement at target joints, use of target muscles after mod verbal, visual, tactile cues.  Response to treatment Pt tolerated session well without aggravation of symptoms.    Clinical impression   Pt demonstrates significant decrease in low back pain, improved bilateral hip extension and abduction strength, improved hip IR, and improve ability to perform functional tasks (improved FOTO score) since initial evaluation. Pt has made very good progress with PT towards goals and demonstrates independence and consistency with his HEP. Skilled physical therapy for low back and hip pain discharged with pt continuing progress with HEP. Pt to start on PT for neck and shoulder pain next session.          PT Short Term Goals - 09/14/19 0949      PT SHORT TERM GOAL #1   Title  Pt will be independent with HEP in order to improve strength and decrease back, L hip, thigh  and knee pain in order to improve pain-free function at home    Baseline  Pt performing HEP independently and consistently (09/14/2019)    Time  4    Period  Weeks    Status  Achieved    Target Date  09/21/19        PT Long Term Goals - 09/14/19 0949      PT LONG TERM GOAL #1   Title  Patient will have a decrease in low  back pain to 4/10 or less at worst to promote ability to sleep, as well as tolerate positions such as sitting, supine and S/L.    Baseline  9/10 low back pain at most for the past 2 months (08/23/2019); 0/10 at worst for the past 5 days (09/14/2019)    Time  8    Period  Weeks    Status  Achieved    Target Date  10/19/19      PT LONG TERM GOAL #2   Title  Patient will improve bilateral hip abduction and extension strength to promote ability to perform tasks with less back and hip pain .    Time  8    Period  Weeks    Status  Achieved    Target Date  10/19/19      PT LONG TERM GOAL #3   Title  Patient will  improve bilaterl hip IR PROM by at least 8 degrees to promote ability to perform standing tasks with less hip and knee pain.    Baseline  Hip IR PROM: 15 degrees R, 22 degrees L (08/23/2019); R 20 degrees, L 30 degrees (09/14/2019)    Time  8    Period  Weeks    Status  Partially Met    Target Date  10/19/19      PT LONG TERM GOAL #4   Title  Patient will improve his Lumbar FOTO score by at least 10 points as a demonstration of improved function.    Baseline  Lumbar FOTO: 51 (08/23/2019); Lumbar FOTO 69 (09/14/19)    Time  8    Period  Weeks    Status  Achieved    Target Date  10/19/19            Plan - 09/14/19 0949    Clinical Impression Statement  Pt demonstrates significant decrease in low back pain, improved bilateral hip extension and abduction strength, improved hip IR, and improve ability to perform functional tasks (improved FOTO score) since initial evaluation. Pt has made very good progress with PT towards goals and demonstrates independence  and consistency with his HEP. Skilled physical therapy for low back and hip pain discharged with pt continuing progress with HEP. Pt to start on PT for neck and shoulder pain next session.    Personal Factors and Comorbidities  Age;Comorbidity 3+;Time since onset of injury/illness/exacerbation;Past/Current Experience    Comorbidities  Basal cell CA, bladder CA, CHF    Examination-Activity Limitations  Sleep;Transfers;Sit    Stability/Clinical Decision Making  Evolving/Moderate complexity   pain is worsening per pt   Rehab Potential  Fair    PT Frequency  2x / week    PT Duration  8 weeks    PT Treatment/Interventions  Therapeutic activities;Passive range of motion;Manual techniques;Patient/family education;Therapeutic exercise;Functional mobility training;Neuromuscular re-education    PT Next Visit Plan  Continue progress with HEP    Consulted and Agree with Plan of Care  Patient       Patient will benefit from skilled therapeutic intervention in order to improve the following deficits and impairments:  Postural dysfunction, Pain, Decreased activity tolerance, Decreased range of motion, Improper body mechanics, Decreased endurance, Decreased mobility  Visit Diagnosis: Chronic left-sided low back pain, unspecified whether sciatica present  Muscle weakness (generalized)  Pain in left hip  Left knee pain, unspecified chronicity     Problem List Patient Active Problem List   Diagnosis Date Noted  . CVA (cerebral infarction) 08/21/2015  . TIA (transient ischemic  attack) 08/20/2015  . Acute encephalopathy 08/20/2015   Thank you for your referral.  Joneen Boers PT, DPT   09/14/2019, 4:21 PM  [place] [facility] [address], [place], [address] Phone: [phone]   Fax:  [phone]  Name: RAMIRO PANGILINAN MRN: [id number] Date of Birth: 16-Aug-1940

## 2019-09-18 ENCOUNTER — Ambulatory Visit: Payer: Medicare HMO

## 2019-09-18 ENCOUNTER — Other Ambulatory Visit: Payer: Self-pay

## 2019-09-18 DIAGNOSIS — M545 Low back pain: Secondary | ICD-10-CM | POA: Diagnosis not present

## 2019-09-18 DIAGNOSIS — M542 Cervicalgia: Secondary | ICD-10-CM

## 2019-09-18 DIAGNOSIS — M25512 Pain in left shoulder: Secondary | ICD-10-CM

## 2019-09-18 DIAGNOSIS — G8929 Other chronic pain: Secondary | ICD-10-CM

## 2019-09-18 DIAGNOSIS — M6281 Muscle weakness (generalized): Secondary | ICD-10-CM

## 2019-09-18 DIAGNOSIS — M5412 Radiculopathy, cervical region: Secondary | ICD-10-CM

## 2019-09-18 NOTE — Patient Instructions (Signed)
Access Code: [email or web address] URL: [email or web address] Date: 09/18/2019 Prepared by: Joneen Boers  Exercises Seated Hip Internal Rotation AROM - 2 x daily - 7 x weekly - 5 reps - 6 sets Seated Thoracic Lumbar Extension - 1 x daily - 7 x weekly - 10 reps - 3 sets - 5 seconds hold Standing Hip Abduction with Counter Support - 1 x daily - 7 x weekly - 10 reps - 3 sets Seated Cervical Retraction - 1 x daily - 7 x weekly - 10 reps - 3 sets - 5 seconds hold Standing Isometric Shoulder External Rotation with Doorway - 1 x daily - 7 x weekly - 3 sets - 10 reps - 5 seconds hold

## 2019-09-18 NOTE — Therapy (Signed)
[place] [facility] [address], [place], [address] Phone: [phone]   Fax:  [phone]  Physical Therapy Evaluation  Patient Details  Name: Dalton Obrien MRN: [id number] Date of Birth: [birth year] Referring Provider (PT): Allene Dillon, NP   Encounter Date: 09/18/2019  PT End of Session - 09/18/19 0807    Visit Number  1    Number of Visits  13    Date for PT Re-Evaluation  11/02/19    Authorization Type  1    Authorization Time Period  of 10    PT Start Time  0807    PT Stop Time  0916    PT Time Calculation (min)  69 min    Activity Tolerance  Patient tolerated treatment well    Behavior During Therapy  [facility] for tasks assessed/performed       Past Medical History:  Diagnosis Date  . Asthma   . Cancer (HCC)    Basal Cell on Face  . CHF (congestive heart failure) ([place])   . 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  There were no vitals filed for this visit.   Subjective Assessment - 09/18/19 0810    Subjective  Neck: L shoulder    Pertinent History  Neck and L shoulder pain. Neck pain scares him more than the L shoulder. Feels more L shoulder pain however. No pain in neck currently. Pt looked down this morning preparing his breakfast and heard a loud pop. Looking down bothers his L medial scapular border. Feels like it is going to get worse. Pt was laying flat on his back this morning, reached to the L for his phone and bothered his L posterior shoulder inferior to scapular spine. 99% of the time, his L shoulder pain is 0/10. Can play golf without his L shoulder bothering him. Got a cortisone shot L shoulder about 3 months ago which helped but pain returned. Lumbar is good.    Patient Stated Goals  No L shoulder pain 100% of the time.    Currently in Pain?  No/denies    Pain Orientation  Left    Pain Type  Chronic pain    Pain Onset  More than a month ago    Pain Frequency  Occasional    Aggravating Factors   Coughing bothers his skull (posterior and lateral; around greater occipital nerve). looking down, Looking to the L, looking to the R (feels gridning). L shoulder: reaching to the side, empty can position, open can position, donning and doffing coat and shirts.    Pain Relieving Factors  resting position, cortisone shot, chin tucks (?),         [facility] PT Assessment - 09/18/19 0823      Assessment   Medical Diagnosis  cervical pain, chronic L shoulder pain    Referring Provider (PT)  Allene Dillon, NP    Onset Date/Surgical Date  09/11/19   date PT referral signed   Hand Dominance  Right    Prior Therapy  PT had prior PT for back with positive results      Precautions   Precaution Comments  Pacemaker      Restrictions   Other Position/Activity Restrictions  Pacemaker related      Balance Screen   Has the patient fallen in the past 6 months  No    Has the patient had  a decrease in activity level because of a fear of falling?   Yes    Is the patient reluctant to leave their home because of a fear of falling?   No      Prior Function   Level of Independence  Independent    Vocation  Retired    Biomedical scientist  PLOF: less difficulty reaching to the side and turning his head to the side    Leisure  Lots of Golf      Observation/Other Assessments   Focus on Therapeutic Outcomes (FOTO)   Neck FOTO 56      Posture/Postural Control   Posture Comments  Protracted neck, B protracted shoulders, L shoulder lower, decreased lumbar lordosis, R lumbar rotation, R pelvic rotation, slight R lumbar convexity, R greater trochanter higher, R femur ER      AROM   Cervical Flexion  WFL with L medail shoulder blade pain    Cervical Extension  WFL (very minimal L medial shoulder blade sensation)    Cervical - Right Side Bend  Limited with L lateral neck pain     Cervical - Left Side Bend  limited     Cervical - Right Rotation  45    Cervical - Left Rotation  45   with L medial scapular pain     Strength   Right Shoulder Flexion  4+/5    Right Shoulder ABduction  4/5    Right Shoulder Internal Rotation  4/5    Right Shoulder External Rotation  4/5    Left Shoulder Flexion  4/5    Left Shoulder ABduction  4/5    Left Shoulder Internal Rotation  4-/5   with reproduction of L posterior lateral shoulder pain   Left Shoulder External Rotation  4-/5    Right Elbow Flexion  4/5    Right Elbow Extension  4-/5    Left Elbow Flexion  4-/5    Left Elbow Extension  4/5    Right Wrist Extension  4/5    Left Wrist Extension  4/5      Special Tests   Other special tests  (+) open can, (-) Neer's. (+) Apprehenshion test L shoulder                Objective measurements completed on examination: See above findings.      X-rays from patient (sent via email):             Pacemaker   Hx of L biceps tear  increased lordosis around C4 area with TTP   Pt states that L shoulder pain might have started about 2 winters ago on a cold day playing golf and did not warm up.     B scapular protraction R shoulder slightly higher R upper trap muscle tension  L scapular crepitus with scapular retraction to end range   MedbridgeAccess Code: [email or web address]    Therapeutic exercise  Seated manually resisted L scapular retraction isometrics targeting the lower trap muscles 10x3 with 5 second holds   Decreased L medial scapular pain with L cervical rotation and cervical flexion  Standing L shoulder ER Isometrics 10x3 with 5 seconds   Reviewed shoulder HEP with pt. Pt demonstrated and verbalized understanding.    Improved exercise technique, movement at target joints, use of target muscles after mod verbal, visual, tactile cues.   Response to treatment No L medial scapular pain with cervical flexion and L rotation and decreased L shoulder pain with donning his jacket  as well as  reaching to the side   Clinical impression   Patient is a 79 year old male who came to physical therapy secondary to neck and L shoulder pain. He also presents with bilaterally protracted scapulae, bilateral shoulder and scapular weakness, increased R upper trap and rhomboid muscle tension compared to the L, limited cervical AROM and L shoulder flexion AROM, L cervical radiating symptoms to L scapula, positive special test suggesting L shoulder joint involvement, and difficulty performing tasks such as looking to the L, looking down, as well as reaching to the L with his L arm. Pt will benefit from skilled physical therapy services to address the aforementioned deficits.         PT Education - 09/18/19 0857    Education Details  ther-ex, HEP, plan of care    Person(s) Educated  Patient    Methods  Explanation;Demonstration;Tactile cues;Verbal cues;Handout    Comprehension  Returned demonstration;Verbalized understanding       PT Short Term Goals - 09/18/19 0945      PT SHORT TERM GOAL #1   Title  Patient will be independent with his HEP to decrease neck and L shoulder pain and improve ability to look around and reach more comfortably.    Baseline  Pt has started his HEP (09/18/2019)    Time  3    Period  Weeks    Status  New    Target Date  10/12/19        PT Long Term Goals - 09/18/19 0946      PT LONG TERM GOAL #1   Title  Pt will improve L shoulder ER strength by at least 1/2 MMT grade to promote ability to reach more comfortably with his L arm.    Baseline  L shoulder ER 4-/5 (09/18/2019)    Time  6    Period  Weeks    Status  New    Target Date  11/02/19      PT LONG TERM GOAL #2   Title  Pt will be able to turn his head to the R and L without pain to promote ability to look around.    Baseline  Cervical rotation: 45 degrees R, 45 degrees L with L medial scapular pain (09/18/2019)    Time  6    Period  Weeks    Status  --    Target Date  11/02/19      PT LONG  TERM GOAL #3   Title  Patient will improve his Neck FOTO score by at least 10 points as a demonstration of improved function.    Baseline  Neck FOTO 56 (09/18/19)    Time  6    Period  Weeks    Status  New    Target Date  11/02/19      PT LONG TERM GOAL #4   Title  --    Baseline  --    Time  --    Period  --    Status  --             Plan - 09/18/19 0937    Clinical Impression Statement  Patient is a 79 year old male who came to physical therapy secondary to neck and L shoulder pain. He also presents with bilaterally protracted scapulae, bilateral shoulder and scapular weakness, increased R upper trap and rhomboid muscle tension compared to the L, limited cervical AROM and L shoulder flexion AROM, L cervical radiating symptoms  to L scapula, positive special test suggesting L shoulder joint involvement, and difficulty performing tasks such as looking to the L, looking down, as well as reaching to the L with his L arm. Pt will benefit from skilled physical therapy services to address the aforementioned deficits.    Personal Factors and Comorbidities  Age;Comorbidity 3+;Time since onset of injury/illness/exacerbation;Past/Current Experience    Comorbidities  Basal cell CA, bladder CA, CHF    Examination-Activity Limitations  Reach Overhead;Hygiene/Grooming;Other   reaching to the L, donning and doffing shirts and coats   Stability/Clinical Decision Making  Stable/Uncomplicated   pain is worsening per pt   Clinical Decision Making  Low    Rehab Potential  Fair    PT Frequency  2x / week    PT Duration  6 weeks    PT Treatment/Interventions  Therapeutic activities;Passive range of motion;Manual techniques;Patient/family education;Therapeutic exercise;Functional mobility training;Neuromuscular re-education    PT Next Visit Plan  gentle scapular and shoulder strengthening, posture, manual techniques    PT Home Exercise Plan  Medbridge Access Code: [email or web address]    Consulted and Agree with  Plan of Care  Patient       Patient will benefit from skilled therapeutic intervention in order to improve the following deficits and impairments:  Postural dysfunction, Pain, Decreased range of motion, Improper body mechanics, Decreased mobility, Decreased strength  Visit Diagnosis: Cervicalgia - Plan: PT plan of care cert/re-cert  Chronic left shoulder pain - Plan: PT plan of care cert/re-cert  Muscle weakness (generalized) - Plan: PT plan of care cert/re-cert  Radiculopathy, cervical region - Plan: PT plan of care cert/re-cert     Problem List Patient Active Problem List   Diagnosis Date Noted  . CVA (cerebral infarction) 08/21/2015  . TIA (transient ischemic attack) 08/20/2015  . Acute encephalopathy 08/20/2015    Joneen Boers PT, DPT   09/18/2019, 2:43 PM  [place] [address] [place], [address] Phone: [phone]   Fax:  [phone]  Name: Dalton Obrien MRN: [id number] Date of Birth: 04-18-1941

## 2019-09-21 ENCOUNTER — Ambulatory Visit: Payer: Medicare HMO

## 2019-09-21 ENCOUNTER — Other Ambulatory Visit: Payer: Self-pay

## 2019-09-21 DIAGNOSIS — M6281 Muscle weakness (generalized): Secondary | ICD-10-CM

## 2019-09-21 DIAGNOSIS — M5412 Radiculopathy, cervical region: Secondary | ICD-10-CM

## 2019-09-21 DIAGNOSIS — M542 Cervicalgia: Secondary | ICD-10-CM

## 2019-09-21 DIAGNOSIS — G8929 Other chronic pain: Secondary | ICD-10-CM

## 2019-09-21 DIAGNOSIS — M545 Low back pain: Secondary | ICD-10-CM | POA: Diagnosis not present

## 2019-09-21 NOTE — Therapy (Signed)
[place] [facility] [address], [place], [address] Phone: [phone]   Fax:  [phone]  Physical Therapy Treatment  Patient Details  Name: Dalton Obrien MRN: [id number] Date of Birth: 10-19-1940 Referring Provider (PT): Allene Dillon, NP   Encounter Date: 09/21/2019  PT End of Session - 09/21/19 0934    Visit Number  2    Number of Visits  13    Date for PT Re-Evaluation  11/02/19    Authorization Type  2    Authorization Time Period  of 10    PT Start Time  0934    PT Stop Time  1016    PT Time Calculation (min)  42 min    Activity Tolerance  Patient tolerated treatment well    Behavior During Therapy  [facility] for tasks assessed/performed       Past Medical History:  Diagnosis Date  . Asthma   . Cancer (HCC)    Basal Cell on Face  . CHF (congestive heart failure) ([place])   . 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  There were no vitals filed for this visit.  Subjective Assessment - 09/21/19 0935    Subjective  L shoulder is a little bit better. Neck still pops, not as frequently. Feels symptoms at back of head (greater occipital nerve area) when coughing. No pain in neck. No pain in L shoulder at rest. 0.5/10 L shoulder pain when raising his arm up.  Played 9 holes of golf yesterday and had no pain anywhere.  Starting to feel weaker and getting congestion from his immunotherapy for his CA.    Pertinent History  Neck and L shoulder pain. Neck pain scares him more than the L shoulder. Feels more L shoulder pain however. No pain in neck currently. Pt looked down this morning preparing his breakfast and heard a loud pop. Looking down bothers his L medial scapular border. Feels like it is going to get worse. Pt was laying flat on his back this morning, reached to the L for his phone and bothered his L posterior shoulder inferior to  scapular spine. 99% of the time, his L shoulder pain is 0/10. Can play golf without his L shoulder bothering him. Got a cortisone shot L shoulder about 3 months ago which helped but pain returned. Lumbar is good.    Patient Stated Goals  No L shoulder pain 100% of the time.    Currently in Pain?  Yes    Pain Score  1    0.5/10 to 1/10   Pain Onset  More than a month ago                               PT Education - 09/21/19 0946    Education Details  ther-ex, HEP    Person(s) Educated  Patient    Methods  Explanation;Demonstration;Tactile cues;Verbal cues;Handout    Comprehension  Verbalized understanding;Returned demonstration      Pacemaker   Hx of L biceps tear     Pt states that L shoulder pain might have started about 2 winters ago on a cold day playing golf and did not warm up.     B scapular protraction R shoulder slightly higher R upper trap muscle tension  L scapular crepitus with scapular retraction to end range  MedbridgeAccess Code: [email or web address]    Therapeutic exercise  Seated manually resisted L elbow extension isometrics 10x3 with 5 second holds   Seated self L elbow extension isometrics, hand on chair arm rest 10x5 seconds   Reviewed HEP. Pt demonstrated and verbalized understanding.   Seated manually resisted L scapular retraction isometrics targeting the lower trap muscles 10x3 with 5 second holds             L shoulder flexion with PT manually resisting L shoulder ER 10x3  L shoulder flexion with PT manually resisting L elbow extension 10x3  L shoulder extension isometrics, PT manually resisted in neutral 10x3 with 5 second holds   Posterior translation of L humeral head palpated with latter 3 exercises     Improved exercise technique, movement at target joints, use of target muscles after mod verbal, visual, tactile cues.   Response to treatment Pt tolerated session well without aggravation of symptoms. Decreased  L shoulder pain with exercises.    Clinical impression  Continued working on improving L posterior shoulder and ER muscles to promote posterior glide of L humeral head to decrease posterior internal impingement when raising his arm or when reaching. Good posterior translation of humeral head palpated during exercises. Pt tolerated session well without aggravation of symptoms. Pt will benefit from continued skilled physical therapy services to decrease pain, improve ROM, strength, and function.        PT Short Term Goals - 09/18/19 0945      PT SHORT TERM GOAL #1   Title  Patient will be independent with his HEP to decrease neck and L shoulder pain and improve ability to look around and reach more comfortably.    Baseline  Pt has started his HEP (09/18/2019)    Time  3    Period  Weeks    Status  New    Target Date  10/12/19        PT Long Term Goals - 09/18/19 0946      PT LONG TERM GOAL #1   Title  Pt will improve L shoulder ER strength by at least 1/2 MMT grade to promote ability to reach more comfortably with his L arm.    Baseline  L shoulder ER 4-/5 (09/18/2019)    Time  6    Period  Weeks    Status  New    Target Date  11/02/19      PT LONG TERM GOAL #2   Title  Pt will be able to turn his head to the R and L without pain to promote ability to look around.    Baseline  Cervical rotation: 45 degrees R, 45 degrees L with L medial scapular pain (09/18/2019)    Time  6    Period  Weeks    Status  --    Target Date  11/02/19      PT LONG TERM GOAL #3   Title  Patient will improve his Neck FOTO score by at least 10 points as a demonstration of improved function.    Baseline  Neck FOTO 56 (09/18/19)    Time  6    Period  Weeks    Status  New    Target Date  11/02/19      PT LONG TERM GOAL #4   Title  --    Baseline  --    Time  --    Period  --    Status  --  Plan - 09/21/19 0955    Clinical Impression Statement  Continued working on improving L  posterior shoulder and ER muscles to promote posterior glide of L humeral head to decrease posterior internal impingement when raising his arm or when reaching. Good posterior translation of humeral head palpated during exercises. Pt tolerated session well without aggravation of symptoms. Pt will benefit from continued skilled physical therapy services to decrease pain, improve ROM, strength, and function.    Personal Factors and Comorbidities  Age;Comorbidity 3+;Time since onset of injury/illness/exacerbation;Past/Current Experience    Comorbidities  Basal cell CA, bladder CA, CHF    Examination-Activity Limitations  Reach Overhead;Hygiene/Grooming;Other   reaching to the L, donning and doffing shirts and coats   Stability/Clinical Decision Making  Stable/Uncomplicated   pain is worsening per pt   Rehab Potential  Fair    PT Frequency  2x / week    PT Duration  6 weeks    PT Treatment/Interventions  Therapeutic activities;Passive range of motion;Manual techniques;Patient/family education;Therapeutic exercise;Functional mobility training;Neuromuscular re-education    PT Next Visit Plan  gentle scapular and shoulder strengthening, posture, manual techniques    PT Home Exercise Plan  Medbridge Access Code: [email or web address]    Consulted and Agree with Plan of Care  Patient       Patient will benefit from skilled therapeutic intervention in order to improve the following deficits and impairments:  Postural dysfunction, Pain, Decreased range of motion, Improper body mechanics, Decreased mobility, Decreased strength  Visit Diagnosis: Cervicalgia  Chronic left shoulder pain  Muscle weakness (generalized)  Radiculopathy, cervical region     Problem List Patient Active Problem List   Diagnosis Date Noted  . CVA (cerebral infarction) 08/21/2015  . TIA (transient ischemic attack) 08/20/2015  . Acute encephalopathy 08/20/2015    Joneen Boers PT, DPT   09/21/2019, 10:27 AM  [place] [facility] [address] [place], [address] Phone: [phone]   Fax:  [phone]  Name: LEKENDRICK DIPPOLD MRN: [id number] Date of Birth: 19-Dec-1940

## 2019-09-21 NOTE — Patient Instructions (Signed)
Access Code: [email or web address] URL: [email or web address] Date: 09/21/2019 Prepared by: Joneen Boers  Exercises Seated Hip Internal Rotation AROM - 2 x daily - 7 x weekly - 5 reps - 6 sets Seated Thoracic Lumbar Extension - 1 x daily - 7 x weekly - 10 reps - 3 sets - 5 seconds hold Standing Hip Abduction with Counter Support - 1 x daily - 7 x weekly - 10 reps - 3 sets Seated Cervical Retraction - 1 x daily - 7 x weekly - 10 reps - 3 sets - 5 seconds hold Standing Isometric Shoulder External Rotation with Doorway - 1 x daily - 7 x weekly - 3 sets - 10 reps - 5 seconds hold Seated Isometric Elbow Extension - 1 x daily - 7 x weekly - 3 sets - 10 reps - 5 seconds hold

## 2019-09-25 ENCOUNTER — Ambulatory Visit: Payer: Medicare HMO

## 2019-09-28 ENCOUNTER — Ambulatory Visit: Payer: Medicare HMO

## 2019-10-02 ENCOUNTER — Ambulatory Visit: Payer: Medicare HMO

## 2019-10-02 ENCOUNTER — Other Ambulatory Visit: Payer: Self-pay

## 2019-10-02 DIAGNOSIS — G8929 Other chronic pain: Secondary | ICD-10-CM

## 2019-10-02 DIAGNOSIS — M5412 Radiculopathy, cervical region: Secondary | ICD-10-CM

## 2019-10-02 DIAGNOSIS — M25512 Pain in left shoulder: Secondary | ICD-10-CM

## 2019-10-02 DIAGNOSIS — M545 Low back pain: Secondary | ICD-10-CM | POA: Diagnosis not present

## 2019-10-02 DIAGNOSIS — M542 Cervicalgia: Secondary | ICD-10-CM

## 2019-10-02 DIAGNOSIS — M6281 Muscle weakness (generalized): Secondary | ICD-10-CM

## 2019-10-02 NOTE — Patient Instructions (Signed)
Access Code: [email or web address] URL: [email or web address] Date: 10/02/2019 Prepared by: Joneen Boers  Exercises Seated Hip Internal Rotation AROM - 2 x daily - 7 x weekly - 5 reps - 6 sets Seated Thoracic Lumbar Extension - 1 x daily - 7 x weekly - 10 reps - 3 sets - 5 seconds hold Standing Hip Abduction with Counter Support - 1 x daily - 7 x weekly - 10 reps - 3 sets Seated Cervical Retraction - 1 x daily - 7 x weekly - 10 reps - 3 sets - 5 seconds hold Standing Isometric Shoulder External Rotation with Doorway - 1 x daily - 7 x weekly - 3 sets - 10 reps - 5 seconds hold Seated Isometric Elbow Extension - 1 x daily - 7 x weekly - 3 sets - 10 reps - 5 seconds hold Seated Single Arm Shoulder Horizontal Abduction with Anchored Resistance on Swiss Ball - 1 x daily - 7 x weekly - 2 sets - 10 reps

## 2019-10-02 NOTE — Therapy (Signed)
[place] [facility] [address], [place], [address] Phone: [phone]   Fax:  [phone]  Physical Therapy Treatment  Patient Details  Name: Dalton Obrien MRN: [id number] Date of Birth: 1941/05/12 Referring Provider (PT): Allene Dillon, NP   Encounter Date: 10/02/2019  PT End of Session - 10/02/19 0818    Visit Number  3    Number of Visits  13    Date for PT Re-Evaluation  11/02/19    Authorization Type  3    Authorization Time Period  of 10    PT Start Time  0818    PT Stop Time  0913    PT Time Calculation (min)  55 min    Activity Tolerance  Patient tolerated treatment well    Behavior During Therapy  [facility] for tasks assessed/performed       Past Medical History:  Diagnosis Date  . Asthma   . Cancer (HCC)    Basal Cell on Face  . CHF (congestive heart failure) ([place])   . 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  There were no vitals filed for this visit.  Subjective Assessment - 10/02/19 0820    Subjective  Got checked for COVID, was negative. Had some rattling in his chest. Had an X-ray for his chest which did not look right. Was given antibiotics and did a number in his stomach. Took a week off last weekf to get better. Feels better now. Breathing is better. Doctors think it might have been pneumonia but unsure. Has been doing his HEP during his time off. L shoulder is about the same. 0/10 at rest, reaching to the L side bothers his L posterior shoulder. Turning his head to the L also feels a kink at his L medial shoulder blade. 2/10 reaching with his arm to the side (empty can), 1.5/10 L medial shoulder blade with R cervical rotatoin, 2/10 with L cervical rotation.    Pertinent History  Neck and L shoulder pain. Neck pain scares him more than the L shoulder. Feels more L shoulder pain however. No pain in neck currently. Pt  looked down this morning preparing his breakfast and heard a loud pop. Looking down bothers his L medial scapular border. Feels like it is going to get worse. Pt was laying flat on his back this morning, reached to the L for his phone and bothered his L posterior shoulder inferior to scapular spine. 99% of the time, his L shoulder pain is 0/10. Can play golf without his L shoulder bothering him. Got a cortisone shot L shoulder about 3 months ago which helped but pain returned. Lumbar is good.    Patient Stated Goals  No L shoulder pain 100% of the time.    Currently in Pain?  Yes    Pain Score  2     Pain Onset  More than a month ago                               PT Education - 10/02/19 0924    Education Details  ther-ex, HEP    Person(s) Educated  Patient    Methods  Explanation;Demonstration;Tactile cues;Verbal cues    Comprehension  Returned demonstration;Verbalized understanding       Pacemaker  Hx of L biceps tear   No latex  allergies   Pt states that L shoulder pain might have started about 2 winters ago on a cold day playing golf and did not warm up.    B scapularprotractionR shoulder slightly higher R upper trap muscle tension  L scapular crepitus with scapular retraction to end range   MedbridgeAccess Code: [email or web address]    Going to [place] this Wednesday 10/04/19 after PT to visit grandchildren.  Returns Wedesday 10/11/19.    Manual therapy  Increased muscle tension R rhomboid Seated STM R rhomboid and upper trap muscle  Minimal to no L medial scapular pain with cervical rotation afterwards     Therapeutic exercise  Seated manually resisted   L shoulder ER isometrics 10x5 seconds for 3 sets   L shoulder extension isometrics 10x5 seconds for 3 sets   L elbow extension isometrics 10x3 with 5 second holds    Slight decrease L shoulder pain with reaching to the side after aforementioned exercises  Seated manually  resisted L shoulder horizontal abduction isometrics at 90 degrees flexion 10x5 seconds for 3 sets  Posterior glide of L humeral head palpated.   Seated manually resisted L shoulder horizontal abduction 10x2  Concentric and eccentric contraction   Decreased pain to almost 0/10   Then with yellow band 10x2    Decreased L shoulder pain when reaching to the side.   Reviewed HEP. Pt demonstrated and verbalized understanding.   Improved exercise technique, movement at target joints, use of target muscles after mod verbal, visual, tactile cues.     Response to treatment Pt tolerated session well without aggravation of symptoms. Decreased L shoulder pain with exercises.    Clinical impression  Decreased L medial scapular pain with treatment to decrease R rhomboid and upper trap muscle tension to lower cervical spine. Decrease L posterior shoulder pain when reaching to the side with treatment to promote posterior glide of L humeral head. PT will benefit from continued skilled physical therapy services to decrease pain improve strength and function.         PT Short Term Goals - 09/18/19 0945      PT SHORT TERM GOAL #1   Title  Patient will be independent with his HEP to decrease neck and L shoulder pain and improve ability to look around and reach more comfortably.    Baseline  Pt has started his HEP (09/18/2019)    Time  3    Period  Weeks    Status  New    Target Date  10/12/19        PT Long Term Goals - 09/18/19 0946      PT LONG TERM GOAL #1   Title  Pt will improve L shoulder ER strength by at least 1/2 MMT grade to promote ability to reach more comfortably with his L arm.    Baseline  L shoulder ER 4-/5 (09/18/2019)    Time  6    Period  Weeks    Status  New    Target Date  11/02/19      PT LONG TERM GOAL #2   Title  Pt will be able to turn his head to the R and L without pain to promote ability to look around.    Baseline  Cervical rotation: 45 degrees R, 45  degrees L with L medial scapular pain (09/18/2019)    Time  6    Period  Weeks    Status  --    Target Date  11/02/19  PT LONG TERM GOAL #3   Title  Patient will improve his Neck FOTO score by at least 10 points as a demonstration of improved function.    Baseline  Neck FOTO 56 (09/18/19)    Time  6    Period  Weeks    Status  New    Target Date  11/02/19      PT LONG TERM GOAL #4   Title  --    Baseline  --    Time  --    Period  --    Status  --            Plan - 10/02/19 0818    Clinical Impression Statement  Decreased L medial scapular pain with treatment to decrease R rhomboid and upper trap muscle tension to lower cervical spine. Decrease L posterior shoulder pain when reaching to the side with treatment to promote posterior glide of L humeral head. PT will benefit from continued skilled physical therapy services to decrease pain improve strength and function.    Personal Factors and Comorbidities  Age;Comorbidity 3+;Time since onset of injury/illness/exacerbation;Past/Current Experience    Comorbidities  Basal cell CA, bladder CA, CHF    Examination-Activity Limitations  Reach Overhead;Hygiene/Grooming;Other   reaching to the L, donning and doffing shirts and coats   Stability/Clinical Decision Making  Stable/Uncomplicated   pain is worsening per pt   Rehab Potential  Fair    PT Frequency  2x / week    PT Duration  6 weeks    PT Treatment/Interventions  Therapeutic activities;Passive range of motion;Manual techniques;Patient/family education;Therapeutic exercise;Functional mobility training;Neuromuscular re-education    PT Next Visit Plan  gentle scapular and shoulder strengthening, posture, manual techniques    PT Home Exercise Plan  Medbridge Access Code: [email or web address]    Consulted and Agree with Plan of Care  Patient       Patient will benefit from skilled therapeutic intervention in order to improve the following deficits and impairments:  Postural dysfunction,  Pain, Decreased range of motion, Improper body mechanics, Decreased mobility, Decreased strength  Visit Diagnosis: Cervicalgia  Chronic left shoulder pain  Muscle weakness (generalized)  Radiculopathy, cervical region     Problem List Patient Active Problem List   Diagnosis Date Noted  . CVA (cerebral infarction) 08/21/2015  . TIA (transient ischemic attack) 08/20/2015  . Acute encephalopathy 08/20/2015     Joneen Boers PT, DPT   10/02/2019, 9:27 AM  [place] [facility] [address], [place], [address] Phone: [phone]   Fax:  [phone]  Name: Dalton Obrien MRN: [id number] Date of Birth: Dec 01, 1940

## 2019-10-05 ENCOUNTER — Ambulatory Visit: Payer: Medicare HMO | Attending: Student

## 2019-10-05 ENCOUNTER — Other Ambulatory Visit: Payer: Self-pay

## 2019-10-05 ENCOUNTER — Ambulatory Visit: Payer: Medicare HMO

## 2019-10-05 DIAGNOSIS — M25512 Pain in left shoulder: Secondary | ICD-10-CM | POA: Insufficient documentation

## 2019-10-05 DIAGNOSIS — M5412 Radiculopathy, cervical region: Secondary | ICD-10-CM | POA: Diagnosis present

## 2019-10-05 DIAGNOSIS — M6281 Muscle weakness (generalized): Secondary | ICD-10-CM | POA: Insufficient documentation

## 2019-10-05 DIAGNOSIS — M542 Cervicalgia: Secondary | ICD-10-CM

## 2019-10-05 DIAGNOSIS — G8929 Other chronic pain: Secondary | ICD-10-CM

## 2019-10-05 NOTE — Therapy (Signed)
[facility] [address] [place], [address] Phone: [phone]   Fax:  [phone]  Physical Therapy Treatment  Patient Details  Name: Dalton Obrien MRN: [id number] Date of Birth: 07-13-1940 Referring Provider (PT): Allene Dillon, NP   Encounter Date: 10/05/2019  PT End of Session - 10/05/19 0815    Visit Number  4    Number of Visits  13    Date for PT Re-Evaluation  11/02/19    Authorization Type  4    Authorization Time Period  of 10    PT Start Time  0815    PT Stop Time  0856    PT Time Calculation (min)  41 min    Activity Tolerance  Patient tolerated treatment well    Behavior During Therapy  [facility] for tasks assessed/performed       Past Medical History:  Diagnosis Date  . Asthma   . Cancer (HCC)    Basal Cell on Face  . CHF (congestive heart failure) ([place])   . 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  There were no vitals filed for this visit.  Subjective Assessment - 10/05/19 0823    Subjective  1/10 L posterior shoulder when reaching to the side. L shoulder blade is about a 1/10 but infrequent. Looking down reproduces L medial shoulder blade symptoms.    Pertinent History  Neck and L shoulder pain. Neck pain scares him more than the L shoulder. Feels more L shoulder pain however. No pain in neck currently. Pt looked down this morning preparing his breakfast and heard a loud pop. Looking down bothers his L medial scapular border. Feels like it is going to get worse. Pt was laying flat on his back this morning, reached to the L for his phone and bothered his L posterior shoulder inferior to scapular spine. 99% of the time, his L shoulder pain is 0/10. Can play golf without his L shoulder bothering him. Got a cortisone shot L shoulder about 3 months ago which helped but pain returned. Lumbar is good.    Patient Stated  Goals  No L shoulder pain 100% of the time.    Currently in Pain?  Yes    Pain Score  1     Pain Onset  More than a month ago                               PT Education - 10/05/19 0851    Education Details  ther-ex, HEP    Person(s) Educated  Patient    Methods  Explanation;Demonstration;Tactile cues;Verbal cues;Handout    Comprehension  Returned demonstration;Verbalized understanding      Pacemaker  Hx of L biceps tear   No latex allergies   Pt states that L shoulder pain might have started about 2 winters ago on a cold day playing golf and did not warm up.    B scapularprotractionR shoulder slightly higher R upper trap muscle tension  L scapular crepitus with scapular retraction to end range   MedbridgeAccess Code: [email or web address]        Manual therapy   Seated STM R rhomboid muscle         no L medial scapular pain with cervical flexion afterwards     Therapeutic exercise  Seated manually resisted  L scapular retraction isometrics targeting lower trap muscle 10x5 seconds              L shoulder ER isometrics 10x5 seconds for 3 sets               L shoulder extension isometrics 10x5 seconds              L shoulder extension isometrics 10x5 seconds for 2 sets (at chair, pt pushing arm back against 2 folded towels  Reviewed and given as part of his HEP. Pt demonstrated and verbalized understanding.    L elbow extension isometrics 10x3 with 5 second holds, pt pressing L forearm against arm rest    Seated manually resisted L shoulder horizontal abduction 10x2  Concentric and eccentric contraction              Improved exercise technique, movement at target joints, use of target muscles after mod verbal, visual, tactile cues.     Response to treatment Pt tolerated session well without aggravation of symptoms. Decreased L shoulder pain with exercises.   Clinical impression No pain with reaching out  to the side as well as looking down after session. Decreased pain with treatment to decrease R rhomboid muscle tension as well as promoting posterior humeral head glide.  Pt tolerated session well without aggravation of symptoms. Pt will benefit from continued skilled physical therapy services to decrease pain, improve strength, and function.     PT Short Term Goals - 09/18/19 0945      PT SHORT TERM GOAL #1   Title  Patient will be independent with his HEP to decrease neck and L shoulder pain and improve ability to look around and reach more comfortably.    Baseline  Pt has started his HEP (09/18/2019)    Time  3    Period  Weeks    Status  New    Target Date  10/12/19        PT Long Term Goals - 09/18/19 0946      PT LONG TERM GOAL #1   Title  Pt will improve L shoulder ER strength by at least 1/2 MMT grade to promote ability to reach more comfortably with his L arm.    Baseline  L shoulder ER 4-/5 (09/18/2019)    Time  6    Period  Weeks    Status  New    Target Date  11/02/19      PT LONG TERM GOAL #2   Title  Pt will be able to turn his head to the R and L without pain to promote ability to look around.    Baseline  Cervical rotation: 45 degrees R, 45 degrees L with L medial scapular pain (09/18/2019)    Time  6    Period  Weeks    Status  --    Target Date  11/02/19      PT LONG TERM GOAL #3   Title  Patient will improve his Neck FOTO score by at least 10 points as a demonstration of improved function.    Baseline  Neck FOTO 56 (09/18/19)    Time  6    Period  Weeks    Status  New    Target Date  11/02/19      PT LONG TERM GOAL #4   Title  --    Baseline  --    Time  --    Period  --  Status  --            Plan - 10/05/19 0815    Clinical Impression Statement  No pain with reaching out to the side as well as looking down after session. Decreased pain with treatment to decrease R rhomboid muscle tension as well as promoting posterior humeral head glide.   Pt tolerated session well without aggravation of symptoms. Pt will benefit from continued skilled physical therapy services to decrease pain, improve strength, and function.    Personal Factors and Comorbidities  Age;Comorbidity 3+;Time since onset of injury/illness/exacerbation;Past/Current Experience    Comorbidities  Basal cell CA, bladder CA, CHF    Examination-Activity Limitations  Reach Overhead;Hygiene/Grooming;Other   reaching to the L, donning and doffing shirts and coats   Stability/Clinical Decision Making  Stable/Uncomplicated   pain is worsening per pt   Rehab Potential  Fair    PT Frequency  2x / week    PT Duration  6 weeks    PT Treatment/Interventions  Therapeutic activities;Passive range of motion;Manual techniques;Patient/family education;Therapeutic exercise;Functional mobility training;Neuromuscular re-education    PT Next Visit Plan  gentle scapular and shoulder strengthening, posture, manual techniques    PT Home Exercise Plan  Medbridge Access Code: [email or web address]    Consulted and Agree with Plan of Care  Patient       Patient will benefit from skilled therapeutic intervention in order to improve the following deficits and impairments:  Postural dysfunction, Pain, Decreased range of motion, Improper body mechanics, Decreased mobility, Decreased strength  Visit Diagnosis: Cervicalgia  Chronic left shoulder pain  Muscle weakness (generalized)  Radiculopathy, cervical region     Problem List Patient Active Problem List   Diagnosis Date Noted  . CVA (cerebral infarction) 08/21/2015  . TIA (transient ischemic attack) 08/20/2015  . Acute encephalopathy 08/20/2015     Joneen Boers PT, DPT  10/05/2019, 10:14 AM  [facility] [address], [place], [address] Phone: [phone]   Fax:  [phone]  Name: HAZAIAH KUNKLE MRN: [id number] Date of Birth: 1941-05-28

## 2019-10-05 NOTE — Patient Instructions (Signed)
L elbow extension isometrics 10x3 with 5 second holds

## 2019-10-12 ENCOUNTER — Other Ambulatory Visit: Payer: Self-pay

## 2019-10-12 ENCOUNTER — Ambulatory Visit: Payer: Medicare HMO

## 2019-10-12 DIAGNOSIS — M5412 Radiculopathy, cervical region: Secondary | ICD-10-CM

## 2019-10-12 DIAGNOSIS — G8929 Other chronic pain: Secondary | ICD-10-CM

## 2019-10-12 DIAGNOSIS — M25512 Pain in left shoulder: Secondary | ICD-10-CM

## 2019-10-12 DIAGNOSIS — M542 Cervicalgia: Secondary | ICD-10-CM | POA: Diagnosis not present

## 2019-10-12 DIAGNOSIS — M6281 Muscle weakness (generalized): Secondary | ICD-10-CM

## 2019-10-12 NOTE — Therapy (Signed)
[facility] [address], [place], [address] Phone: [phone]   Fax:  [phone]  Physical Therapy Treatment  Patient Details  Name: Dalton Obrien MRN: [id number] Date of Birth: 31-May-1941 Referring Provider (PT): Allene Dillon, NP   Encounter Date: 10/12/2019  PT End of Session - 10/12/19 0804    Visit Number  5    Number of Visits  13    Date for PT Re-Evaluation  11/02/19    Authorization Type  5    Authorization Time Period  of 10    PT Start Time  0805    PT Stop Time  0845    PT Time Calculation (min)  40 min    Activity Tolerance  Patient tolerated treatment well    Behavior During Therapy  [facility] for tasks assessed/performed       Past Medical History:  Diagnosis Date  . Asthma   . Cancer (HCC)    Basal Cell on Face  . CHF (congestive heart failure) ([place])   . 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  There were no vitals filed for this visit.  Subjective Assessment - 10/12/19 0807    Subjective  L shoulder and neck are about the same. The immune therapy injections have been giving him more side effects, including his lungs. A little bit short of breath. Has had 3 injections now out of 4.  When pt looks down to the R however, pt feels pulling at the edge of his L shoulder blade. No pain in his neck.  Has not been feeling L posterior shoulder pain for the past several days.    Pertinent History  Neck and L shoulder pain. Neck pain scares him more than the L shoulder. Feels more L shoulder pain however. No pain in neck currently. Pt looked down this morning preparing his breakfast and heard a loud pop. Looking down bothers his L medial scapular border. Feels like it is going to get worse. Pt was laying flat on his back this morning, reached to the L for his phone and bothered his L posterior shoulder inferior to scapular  spine. 99% of the time, his L shoulder pain is 0/10. Can play golf without his L shoulder bothering him. Got a cortisone shot L shoulder about 3 months ago which helped but pain returned. Lumbar is good.    Patient Stated Goals  No L shoulder pain 100% of the time.    Currently in Pain?  No/denies    Pain Score  0-No pain    Pain Onset  More than a month ago                               PT Education - 10/12/19 0816    Education Details  ther-ex    Person(s) Educated  Patient    Methods  Explanation;Demonstration;Tactile cues;Verbal cues    Comprehension  Returned demonstration;Verbalized understanding      Pacemaker  Hx of L biceps tear   No latex allergies  Pt states that L shoulder pain might have started about 2 winters ago on a cold day playing golf and did not warm up.    B scapularprotractionR shoulder slightly higher R upper trap muscle tension  L scapular crepitus with scapular retraction to end range   MedbridgeAccess  Code: [email or web address]     Therapeutic exercise  Seated cervical flexion isometrics self resisted with thumbs (and pressing tongue at roof of mouth) 10x3 with 5 second holds   Decreased cervical paraspinal muscle tension palpated.   Standing L shoulder extension resisted, green band 10x, then 10x2 with 5 second holds  Decreased L scapular pull  Standing L shoulder horizontal abduction red band 10x5 seconds. Then 5x3  Seated manually resisted              L scapular retraction isometrics targeting lower trap muscle 10x5 seconds for 3 sets     No L scapular pain with cervical flexion to the R   L elbow extension isometrics 10x3 with 5 second holds   No pain with L shoulder flexion   Good posterior translation of humeral head palpated.    Improved exercise technique, movement at target joints, use of target muscles after mod verbal, visual, tactile cues.    Response to treatment Pt tolerated  session well without aggravation of symptoms. Decreased L shoulder pain and shoulder blade pain with exercises.   Clinical impression Dereased L shoulder blade and posterior shoulder pain with treatment to promote scapular strengthening as well as posterior glide of his humeral head. Pt will benefit from continued skilled physical therapy services to decrease pain, improve strength and function.    PT Short Term Goals - 09/18/19 0945      PT SHORT TERM GOAL #1   Title  Patient will be independent with his HEP to decrease neck and L shoulder pain and improve ability to look around and reach more comfortably.    Baseline  Pt has started his HEP (09/18/2019)    Time  3    Period  Weeks    Status  New    Target Date  10/12/19        PT Long Term Goals - 09/18/19 0946      PT LONG TERM GOAL #1   Title  Pt will improve L shoulder ER strength by at least 1/2 MMT grade to promote ability to reach more comfortably with his L arm.    Baseline  L shoulder ER 4-/5 (09/18/2019)    Time  6    Period  Weeks    Status  New    Target Date  11/02/19      PT LONG TERM GOAL #2   Title  Pt will be able to turn his head to the R and L without pain to promote ability to look around.    Baseline  Cervical rotation: 45 degrees R, 45 degrees L with L medial scapular pain (09/18/2019)    Time  6    Period  Weeks    Status  --    Target Date  11/02/19      PT LONG TERM GOAL #3   Title  Patient will improve his Neck FOTO score by at least 10 points as a demonstration of improved function.    Baseline  Neck FOTO 56 (09/18/19)    Time  6    Period  Weeks    Status  New    Target Date  11/02/19      PT LONG TERM GOAL #4   Title  --    Baseline  --    Time  --    Period  --    Status  --            Plan - 10/12/19 [id number]  Clinical Impression Statement  Dereased L shoulder blade and posterior shoulder pain with treatment to promote scapular strengthening as well as posterior glide of his  humeral head. Pt will benefit from continued skilled physical therapy services to decrease pain, improve strength and function.    Personal Factors and Comorbidities  Age;Comorbidity 3+;Time since onset of injury/illness/exacerbation;Past/Current Experience    Comorbidities  Basal cell CA, bladder CA, CHF    Examination-Activity Limitations  Reach Overhead;Hygiene/Grooming;Other   reaching to the L, donning and doffing shirts and coats   Stability/Clinical Decision Making  Stable/Uncomplicated   pain is worsening per pt   Rehab Potential  Fair    PT Frequency  2x / week    PT Duration  6 weeks    PT Treatment/Interventions  Therapeutic activities;Passive range of motion;Manual techniques;Patient/family education;Therapeutic exercise;Functional mobility training;Neuromuscular re-education    PT Next Visit Plan  gentle scapular and shoulder strengthening, posture, manual techniques    PT Home Exercise Plan  Medbridge Access Code: [email or web address]    Consulted and Agree with Plan of Care  Patient       Patient will benefit from skilled therapeutic intervention in order to improve the following deficits and impairments:  Postural dysfunction, Pain, Decreased range of motion, Improper body mechanics, Decreased mobility, Decreased strength  Visit Diagnosis: Cervicalgia  Chronic left shoulder pain  Muscle weakness (generalized)  Radiculopathy, cervical region     Problem List Patient Active Problem List   Diagnosis Date Noted  . CVA (cerebral infarction) 08/21/2015  . TIA (transient ischemic attack) 08/20/2015  . Acute encephalopathy 08/20/2015    Joneen Boers PT, DPT   10/12/2019, 5:42 PM  [facility] [address] [place], [address] Phone: [phone]   Fax:  [phone]  Name: Dalton Obrien MRN: [id number] Date of Birth: October 28, 1940

## 2019-10-16 ENCOUNTER — Other Ambulatory Visit: Payer: Self-pay

## 2019-10-16 ENCOUNTER — Ambulatory Visit: Payer: Medicare HMO

## 2019-10-16 DIAGNOSIS — M542 Cervicalgia: Secondary | ICD-10-CM | POA: Diagnosis not present

## 2019-10-16 DIAGNOSIS — M25512 Pain in left shoulder: Secondary | ICD-10-CM

## 2019-10-16 DIAGNOSIS — M5412 Radiculopathy, cervical region: Secondary | ICD-10-CM

## 2019-10-16 DIAGNOSIS — M6281 Muscle weakness (generalized): Secondary | ICD-10-CM

## 2019-10-16 DIAGNOSIS — G8929 Other chronic pain: Secondary | ICD-10-CM

## 2019-10-16 NOTE — Patient Instructions (Addendum)
Shoulder extension isometrics   Standing, pull the door open (make sure door is closed, you are not opening the door)   Hold for 5 seconds   Repeat 10 times   Perform 3 sets daily.      Access Code: [email or web address] URL: [email or web address] Date: 10/16/2019 Prepared by: Joneen Boers  Exercises Seated Hip Internal Rotation AROM - 2 x daily - 7 x weekly - 5 reps - 6 sets Seated Thoracic Lumbar Extension - 1 x daily - 7 x weekly - 10 reps - 3 sets - 5 seconds hold Standing Hip Abduction with Counter Support - 1 x daily - 7 x weekly - 10 reps - 3 sets Seated Cervical Retraction - 1 x daily - 7 x weekly - 10 reps - 3 sets - 5 seconds hold Standing Isometric Shoulder External Rotation with Doorway - 1 x daily - 7 x weekly - 3 sets - 10 reps - 5 seconds hold Seated Isometric Elbow Extension - 1 x daily - 7 x weekly - 3 sets - 10 reps - 5 seconds hold Seated Single Arm Shoulder Horizontal Abduction with Anchored Resistance on Swiss Ball - 1 x daily - 7 x weekly - 2 sets - 10 reps Isometric Shoulder Extension with Ball at [employer] - 1 x daily - 7 x weekly - 3 sets - 10 reps - 5 seconds hold Scapular Retraction with Resistance - 1 x daily - 7 x weekly - 3 sets - 10 reps - 5 seconds hold

## 2019-10-16 NOTE — Therapy (Signed)
[facility] [address], [place], [address] Phone: [phone]   Fax:  [phone]  Physical Therapy Treatment  Patient Details  Name: Dalton Obrien MRN: [id number] Date of Birth: 1941-01-05 Referring Provider (PT): Allene Dillon, NP   Encounter Date: 10/16/2019  PT End of Session - 10/16/19 0846    Visit Number  6    Number of Visits  13    Date for PT Re-Evaluation  11/02/19    Authorization Type  6    Authorization Time Period  of 10    PT Start Time  [phone]    PT Stop Time  0932    PT Time Calculation (min)  46 min    Activity Tolerance  Patient tolerated treatment well    Behavior During Therapy  [facility] for tasks assessed/performed       Past Medical History:  Diagnosis Date  . Asthma   . Cancer (HCC)    Basal Cell on Face  . CHF (congestive heart failure) ([place])   . 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  There were no vitals filed for this visit.  Subjective Assessment - 10/16/19 0846    Subjective  Beginning to get weaker and weaker from his immuno shots. Was so weak Friday morning, could not walk across the room. Could not breath. Went to [facility] on Friday 10/13/19 and spent 9 hours at the ER.  His upper lung was starting to hurt. Pt got too much of the immuno medication. Was given steroids to calm it down. Was able to play golf for the past 2 days. Did not have any pain when he puts his L arm in his jacket slowly. Feels about 0.5 to 1/10 when his L arm is abducted and thumbs down. Otherwise not problem. Has been walking about a mile a day and has been doing his HEP. Feels L medail shoulder blade pain when he turns his head to the R and looks down. about a 1/10.    Pertinent History  Neck and L shoulder pain. Neck pain scares him more than the L shoulder. Feels more L shoulder pain however. No pain in neck currently. Pt  looked down this morning preparing his breakfast and heard a loud pop. Looking down bothers his L medial scapular border. Feels like it is going to get worse. Pt was laying flat on his back this morning, reached to the L for his phone and bothered his L posterior shoulder inferior to scapular spine. 99% of the time, his L shoulder pain is 0/10. Can play golf without his L shoulder bothering him. Got a cortisone shot L shoulder about 3 months ago which helped but pain returned. Lumbar is good.    Patient Stated Goals  No L shoulder pain 100% of the time.    Currently in Pain?  Yes    Pain Score  1    L shoulder blade   Pain Onset  More than a month ago                               PT Education - 10/16/19 0859    Education Details  ther-ex, HEP    Person(s) Educated  Patient    Methods  Explanation;Demonstration;Tactile cues;Verbal cues;Handout    Comprehension  Returned demonstration;Verbalized understanding  Objectives  Pacemaker  Hx of L biceps tear   No latex allergies  Pt states that L shoulder pain might have started about 2 winters ago on a cold day playing golf and did not warm up.    B scapularprotractionR shoulder slightly higher R upper trap muscle tension  L scapular crepitus with scapular retraction to end range   MedbridgeAccess Code: [email or web address]     Therapeutic exercise  Standing L shoulder extension with scapular retraction red band 10x5 seconds for 3 sets.   Seated thoracic extension over chair 10x5 seconds for 2 sets  No change in L shoulder blade symptoms   Seated L upper trap stretch 5x10 seconds for 2 sets  Standing B scapular retraction green band 10x5 seconds for 3 sets  Decreased L medial shoulder blade pain.   Reviewed HEP with pt per request  Standing L shoulder extension isometrics holding door knob ("pull the locked door open")  10x5 seconds for 2 sets  Decreased L shoulder blade pain to  0/10  Reviewed and given as part of his HEP. Pt demonstrated and verbalized understanding.     Improved exercise technique, movement at target joints, use of target muscles after mod verbal, visual, tactile cues.    Response to treatment Pt tolerated session well without aggravation of symptoms.     Clinical impression Decreased L scapular symptoms with treatment to promote scapular strengthening. Pt tolerated session well without aggravation of symptoms. Improving L shoulder comfort level based on subjective report. Pt will benefit from continued skilled physical therapy services to decrease pain, improve ROM, strength, and function. Challenges to progress include his imunotherapy which causes weakness as well as presence of pacemaker which limits use of modalities such as E-stim, and manual therapy to pectoralis.      PT Short Term Goals - 09/18/19 0945      PT SHORT TERM GOAL #1   Title  Patient will be independent with his HEP to decrease neck and L shoulder pain and improve ability to look around and reach more comfortably.    Baseline  Pt has started his HEP (09/18/2019)    Time  3    Period  Weeks    Status  New    Target Date  10/12/19        PT Long Term Goals - 09/18/19 0946      PT LONG TERM GOAL #1   Title  Pt will improve L shoulder ER strength by at least 1/2 MMT grade to promote ability to reach more comfortably with his L arm.    Baseline  L shoulder ER 4-/5 (09/18/2019)    Time  6    Period  Weeks    Status  New    Target Date  11/02/19      PT LONG TERM GOAL #2   Title  Pt will be able to turn his head to the R and L without pain to promote ability to look around.    Baseline  Cervical rotation: 45 degrees R, 45 degrees L with L medial scapular pain (09/18/2019)    Time  6    Period  Weeks    Status  --    Target Date  11/02/19      PT LONG TERM GOAL #3   Title  Patient will improve his Neck FOTO score by at least 10 points as a demonstration  of improved function.    Baseline  Neck FOTO 56 (09/18/19)  Time  6    Period  Weeks    Status  New    Target Date  11/02/19      PT LONG TERM GOAL #4   Title  --    Baseline  --    Time  --    Period  --    Status  --            Plan - 10/16/19 0859    Clinical Impression Statement  Decreased L scapular symptoms with treatment to promote scapular strengthening. Pt tolerated session well without aggravation of symptoms. Improving L shoulder comfort level based on subjective report. Pt will benefit from continued skilled physical therapy services to decrease pain, improve ROM, strength, and function. Challenges to progress include his imunotherapy which causes weakness as well as presence of pacemaker which limits use of modalities such as E-stim, and manual therapy to pectoralis.    Personal Factors and Comorbidities  Age;Comorbidity 3+;Time since onset of injury/illness/exacerbation;Past/Current Experience    Comorbidities  Basal cell CA, bladder CA, CHF    Examination-Activity Limitations  Reach Overhead;Hygiene/Grooming;Other   reaching to the L, donning and doffing shirts and coats   Stability/Clinical Decision Making  Stable/Uncomplicated   pain is worsening per pt   Rehab Potential  Fair    PT Frequency  2x / week    PT Duration  6 weeks    PT Treatment/Interventions  Therapeutic activities;Passive range of motion;Manual techniques;Patient/family education;Therapeutic exercise;Functional mobility training;Neuromuscular re-education    PT Next Visit Plan  gentle scapular and shoulder strengthening, posture, manual techniques    PT Home Exercise Plan  Medbridge Access Code: [email or web address]    Consulted and Agree with Plan of Care  Patient       Patient will benefit from skilled therapeutic intervention in order to improve the following deficits and impairments:  Postural dysfunction, Pain, Decreased range of motion, Improper body mechanics, Decreased mobility, Decreased  strength  Visit Diagnosis: Cervicalgia  Chronic left shoulder pain  Muscle weakness (generalized)  Radiculopathy, cervical region     Problem List Patient Active Problem List   Diagnosis Date Noted  . CVA (cerebral infarction) 08/21/2015  . TIA (transient ischemic attack) 08/20/2015  . Acute encephalopathy 08/20/2015    Joneen Boers PT, DPT   10/16/2019, 9:45 AM  [facility] [address] [place], [address] Phone: [phone]   Fax:  [phone]  Name: Dalton Obrien MRN: [id number] Date of Birth: 27-Mar-1941

## 2019-10-18 ENCOUNTER — Ambulatory Visit: Payer: Medicare HMO

## 2019-10-18 ENCOUNTER — Other Ambulatory Visit: Payer: Self-pay

## 2019-10-18 DIAGNOSIS — M25512 Pain in left shoulder: Secondary | ICD-10-CM

## 2019-10-18 DIAGNOSIS — M6281 Muscle weakness (generalized): Secondary | ICD-10-CM

## 2019-10-18 DIAGNOSIS — M542 Cervicalgia: Secondary | ICD-10-CM

## 2019-10-18 DIAGNOSIS — M5412 Radiculopathy, cervical region: Secondary | ICD-10-CM

## 2019-10-18 DIAGNOSIS — G8929 Other chronic pain: Secondary | ICD-10-CM

## 2019-10-18 NOTE — Therapy (Signed)
[facility] [address], [place], [address] Phone: [phone]   Fax:  [phone]  Physical Therapy Treatment  Patient Details  Name: Dalton Obrien MRN: [id number] Date of Birth: 05/19/1941 Referring Provider (PT): Allene Dillon, NP   Encounter Date: 10/18/2019  PT End of Session - 10/18/19 0804    Visit Number  7    Number of Visits  13    Date for PT Re-Evaluation  11/02/19    Authorization Type  7    Authorization Time Period  of 10    PT Start Time  0804    PT Stop Time  0850    PT Time Calculation (min)  46 min    Activity Tolerance  Patient tolerated treatment well    Behavior During Therapy  [facility] for tasks assessed/performed       Past Medical History:  Diagnosis Date  . Asthma   . Cancer (HCC)    Basal Cell on Face  . CHF (congestive heart failure) ([place])   . 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  There were no vitals filed for this visit.  Subjective Assessment - 10/18/19 0806    Subjective  Has neck popping yesterday and this morning. Looking down to the R causes 1/10 L medial shoulder blade pain. Raising his L arm out to the side and turning his thumb down causes L posterior shoulder pain, 1/10. No pain when playing golf.    Pertinent History  Neck and L shoulder pain. Neck pain scares him more than the L shoulder. Feels more L shoulder pain however. No pain in neck currently. Pt looked down this morning preparing his breakfast and heard a loud pop. Looking down bothers his L medial scapular border. Feels like it is going to get worse. Pt was laying flat on his back this morning, reached to the L for his phone and bothered his L posterior shoulder inferior to scapular spine. 99% of the time, his L shoulder pain is 0/10. Can play golf without his L shoulder bothering him. Got a cortisone shot L shoulder about 3  months ago which helped but pain returned. Lumbar is good.    Patient Stated Goals  No L shoulder pain 100% of the time.    Currently in Pain?  Yes    Pain Score  1     Pain Onset  More than a month ago                               PT Education - 10/18/19 0814    Education Details  ther-ex    Person(s) Educated  Patient    Methods  Explanation;Demonstration;Tactile cues;Verbal cues    Comprehension  Verbalized understanding;Returned demonstration      Objectives  Pacemaker  Hx of L biceps tear   No latex allergies  Pt states that L shoulder pain might have started about 2 winters ago on a cold day playing golf and did not warm up.    B scapularprotractionR shoulder slightly higher R upper trap muscle tension  L scapular crepitus with scapular retraction to end range   MedbridgeAccess Code: [email or web address]     Therapeutic exercise  Cervical rotation  R:  No pain,    L: reproduced L shoulder blade pain  Observation: R scapula more  lateral compared to L  Standing R shoulder extension with scapular retraction red band 10x5 seconds for 3 sets  Decreased L scapular pain with L cervical rotation  No L posterior shoulder pain with L shoulder abduction and IR  Standing B shoulder ER with scapular retraction 10x3  Decreased L medial shoulder blade pain with R cervical rotation with flexion to almost 0/10  L scapular depression isometrics, pressing forearm against chair arm rest 10x3 with 5 second holds   No L medial scapular pain with L cervical rotation. Still has a little L medial scapular symptoms with R cervical rotation with flexion    Seated manually resisted R cervical side bend isometrics in neutral 10x5 seconds for 3 sets to decrease L scalene muscle tension   Decreased symptoms   Pt was also recommended to press his tongue at the roof of his mouth throughout the day to help activate his anterior cervical  muscles  Reviewed new HEP. Pt demonstrated and verbalized understanding.       Improved exercise technique, movement at target joints, use of target muscles after mod verbal, visual, tactile cues.    Response to treatment Pt tolerated session well without aggravation of symptoms. No L shoulder pain and decreased L medial scapular symptoms to 0.1/10 after session.    Clinical impression Decreased L medial scapular symptoms with treatment to promote scapular strengthening, reciprocal inhibition of L scalene muscles, as well as anterior cervical muscle activation. Decreased L posterior shoulder pain with treatment to promote scapular strengthening and infraspinatus muscle strengthening. Pt tolerated session well without aggravation of symptoms. Pt will benefit from continued skilled physical therapy services to decrease pain, improve strength and function.    PT Short Term Goals - 09/18/19 0945      PT SHORT TERM GOAL #1   Title  Patient will be independent with his HEP to decrease neck and L shoulder pain and improve ability to look around and reach more comfortably.    Baseline  Pt has started his HEP (09/18/2019)    Time  3    Period  Weeks    Status  New    Target Date  10/12/19        PT Long Term Goals - 09/18/19 0946      PT LONG TERM GOAL #1   Title  Pt will improve L shoulder ER strength by at least 1/2 MMT grade to promote ability to reach more comfortably with his L arm.    Baseline  L shoulder ER 4-/5 (09/18/2019)    Time  6    Period  Weeks    Status  New    Target Date  11/02/19      PT LONG TERM GOAL #2   Title  Pt will be able to turn his head to the R and L without pain to promote ability to look around.    Baseline  Cervical rotation: 45 degrees R, 45 degrees L with L medial scapular pain (09/18/2019)    Time  6    Period  Weeks    Status  --    Target Date  11/02/19      PT LONG TERM GOAL #3   Title  Patient will improve his Neck FOTO score  by at least 10 points as a demonstration of improved function.    Baseline  Neck FOTO 56 (09/18/19)    Time  6    Period  Weeks    Status  New  Target Date  11/02/19      PT LONG TERM GOAL #4   Title  --    Baseline  --    Time  --    Period  --    Status  --            Plan - 10/18/19 0804    Clinical Impression Statement  Decreased L medial scapular symptoms with treatment to promote scapular strengthening, reciprocal inhibition of L scalene muscles, as well as anterior cervical muscle activation. Decreased L posterior shoulder pain with treatment to promote scapular strengthening and infraspinatus muscle strengthening. Pt tolerated session well without aggravation of symptoms. Pt will benefit from continued skilled physical therapy services to decrease pain, improve strength and function.    Personal Factors and Comorbidities  Age;Comorbidity 3+;Time since onset of injury/illness/exacerbation;Past/Current Experience    Comorbidities  Basal cell CA, bladder CA, CHF    Examination-Activity Limitations  Reach Overhead;Hygiene/Grooming;Other   reaching to the L, donning and doffing shirts and coats   Stability/Clinical Decision Making  Stable/Uncomplicated   pain is worsening per pt   Rehab Potential  Fair    PT Frequency  2x / week    PT Duration  6 weeks    PT Treatment/Interventions  Therapeutic activities;Passive range of motion;Manual techniques;Patient/family education;Therapeutic exercise;Functional mobility training;Neuromuscular re-education    PT Next Visit Plan  gentle scapular and shoulder strengthening, posture, manual techniques    PT Home Exercise Plan  Medbridge Access Code: [email or web address]    Consulted and Agree with Plan of Care  Patient       Patient will benefit from skilled therapeutic intervention in order to improve the following deficits and impairments:  Postural dysfunction, Pain, Decreased range of motion, Improper body mechanics, Decreased mobility,  Decreased strength  Visit Diagnosis: Cervicalgia  Chronic left shoulder pain  Muscle weakness (generalized)  Radiculopathy, cervical region     Problem List Patient Active Problem List   Diagnosis Date Noted  . CVA (cerebral infarction) 08/21/2015  . TIA (transient ischemic attack) 08/20/2015  . Acute encephalopathy 08/20/2015    Joneen Boers PT, DPT   10/18/2019, 8:57 AM  [facility] [address] [place], [address] Phone: [phone]   Fax:  [phone]  Name: Dalton Obrien MRN: [id number] Date of Birth: 04/23/1941

## 2019-10-24 ENCOUNTER — Ambulatory Visit: Payer: Medicare HMO

## 2019-10-26 ENCOUNTER — Ambulatory Visit: Payer: Medicare HMO

## 2019-11-29 ENCOUNTER — Ambulatory Visit: Payer: Medicare HMO

## 2020-01-25 ENCOUNTER — Other Ambulatory Visit: Payer: Self-pay | Admitting: Physical Medicine and Rehabilitation

## 2020-01-25 DIAGNOSIS — M5412 Radiculopathy, cervical region: Secondary | ICD-10-CM

## 2020-02-06 ENCOUNTER — Ambulatory Visit: Payer: Medicare HMO

## 2020-02-06 ENCOUNTER — Other Ambulatory Visit: Payer: Self-pay

## 2020-02-06 ENCOUNTER — Ambulatory Visit
Admission: RE | Admit: 2020-02-06 | Discharge: 2020-02-06 | Disposition: A | Discharge status: Home / Self Care | Payer: Medicare HMO | Source: Ambulatory Visit | Attending: Physical Medicine and Rehabilitation | Admitting: Physical Medicine and Rehabilitation

## 2020-02-06 DIAGNOSIS — M5412 Radiculopathy, cervical region: Secondary | ICD-10-CM | POA: Insufficient documentation

## 2020-02-20 ENCOUNTER — Other Ambulatory Visit: Payer: Self-pay

## 2020-02-20 ENCOUNTER — Ambulatory Visit: Payer: Medicare HMO | Attending: Family Medicine

## 2020-02-20 DIAGNOSIS — M5412 Radiculopathy, cervical region: Secondary | ICD-10-CM | POA: Diagnosis present

## 2020-02-20 DIAGNOSIS — M6281 Muscle weakness (generalized): Secondary | ICD-10-CM | POA: Insufficient documentation

## 2020-02-20 DIAGNOSIS — M542 Cervicalgia: Secondary | ICD-10-CM

## 2020-02-20 DIAGNOSIS — M25512 Pain in left shoulder: Secondary | ICD-10-CM | POA: Insufficient documentation

## 2020-02-20 DIAGNOSIS — G8929 Other chronic pain: Secondary | ICD-10-CM | POA: Insufficient documentation

## 2020-02-20 NOTE — Therapy (Signed)
[facility] [address] [place], [address] Phone: [phone]   Fax:  [phone]  Physical Therapy Evaluation  Patient Details  Name: Dalton Obrien MRN: [id number] Date of Birth: [birth year] Referring Provider (PT): Allene Dillon, NP   Encounter Date: 02/20/2020   PT End of Session - 02/20/20 1607    Visit Number 1    Number of Visits 13    Date for PT Re-Evaluation 04/04/20    Authorization Type 1    Authorization Time Period of 10    PT Start Time 9326    PT Stop Time 1704    PT Time Calculation (min) 57 min    Activity Tolerance Patient tolerated treatment well    Behavior During Therapy [facility] for tasks assessed/performed           Past Medical History:  Diagnosis Date  . Asthma   . Cancer (HCC)    Basal Cell on Face  . CHF (congestive heart failure) ([place])   . 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  There were no vitals filed for this visit.    Subjective Assessment - 02/20/20 1608    Subjective L medial shoulder blade: 0/10 currently, 8/10 at worst (rolling onto L side);    Pertinent History Neck and back pain. Neck is the worst. L shoulder medial blade bothers him right at the bone itself. Had a shot which helped for about half a day. Had PT for L shoulder blade which improved L shoulder to 95% to 98% pain being gone. However pain gradually worsened and had to sleep on his recliner. Getting out of bed bothers him. L shoulder blade hurts when trying to get out of bed.  Pain sometimes stops immediately, at other times, pain lingers.  Neck pain: Neck does not bother him but joints sometimes pops.  Main problem is getting out of bed.    Currently in Pain? No/denies    Aggravating Factors  moving his L arm in adduction causes his shoulder blade to rub on ribs. getting out of bed (rolling to the L and sitting up).  R  cervical rotatoin increases L medial shoulder blade pain.              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     Cervical - Left Side Bend Limited with L medial shoulder blade symptoms.     Cervical - Right Rotation 48   shoulder blade symptoms with overpressure   Cervical - Left Rotation 50      Strength   Overall Strength Comments Scapular retraction, manually resisted  R 4/5, L 4/5    Right Shoulder Flexion 4/5    Right Shoulder ABduction 4/5    Left Shoulder Flexion 4/5    Left Shoulder ABduction 4/5    Right Elbow Flexion 4/5    Right Elbow Extension 4+/5    Left Elbow Flexion 4/5    Left Elbow Extension 4+/5    Right Wrist Extension 4/5    Left Wrist Extension 4/5      Palpation   Palpation comment Muscle tension B UT and B scalene muscles                      Objective measurements completed on examination: See above  findings.   Bladder tumor returned and increased in size.   Pt states that his sciatica is fixed. Back is doing well. Returned to PT secondary to L shoulder blade pain  Pacemaker     MedbridgeAccess Code: [email or web address]  Therapeutic exercise  Seated L scapular depression isometrics 10x3 with 5 second holds   No L medial scapular symptoms with L side bending   Wall push-up position: L weight shifting to promote isometric serratus muscle activation 10x2 with 5 seconds.   Give as part of HEP next visit if appropriate.    Improved exercise technique, movement at target joints, use of target muscles after mod verbal, visual, tactile cues.   Response to treatment No L medial shoulder blade pain with L cervical side bend after scapular strengthenign exercises.    Clinical Impression Pt is a 79 year old male who came to physical therapy secondary to L medial shoulder blade pain. He also presents with altered posture, B scapular weakness, limited cervical AROM, reproduction of symptoms with L cervical side bending as well as R cervical rotation, B upper trap and scalene muscle tension, and difficulty performing tasks which involve moving his head as well as difficulty getting out of bed secondary to pain. Pt will benefit from skilled physical therapy services to address the aforementioned deficits.            PT Education - 02/20/20 1811    Education Details ther-ex, plan of care    Person(s) Educated Patient    Methods Explanation;Demonstration;Tactile cues;Verbal cues    Comprehension Returned demonstration;Verbalized understanding            PT Short Term Goals - 02/20/20 1816      PT SHORT TERM GOAL #1   Title Patient will be independent with his HEP to decrease L scapular pain to promote ability to get out of bed more comfortably.    Time 3    Period Weeks    Status New    Target Date 03/14/20             PT Long Term Goals - 02/20/20 1821      PT LONG TERM GOAL  #1   Title Patient will have a decrease in L medial shoulder blade pain to 3/10 or less at worst to promote ability to get out of bed more comfortably.  Baseline 8/10 at most (02/20/2020)    Time 6    Period Weeks    Status New    Target Date 04/04/20      PT LONG TERM GOAL #2   Title Patient will improve L scapular strength by at least 1/2 MMT grade to promote ability to look around as well as get out of bed more comfortably.    Baseline Manually resisted scapular retraction 4/5 L (02/20/2020)    Time 6    Period Weeks    Status New    Target Date 04/04/20      PT LONG TERM GOAL #3   Title Patient will improve his neck FOTO score by at least 10 points as a demonstration of improved function.    Baseline Neck FOTO: 59 (02/20/2020)    Time 6    Period Weeks    Status New    Target Date 04/04/20                  Plan - 02/20/20 1811    Clinical Impression Statement Pt is a 79 year old male who came to physical therapy secondary to L medial shoulder blade pain. He also presents with altered posture, B scapular weakness, limited cervical AROM, reproduction of symptoms with L cervical side bending as well as R cervical rotation, B upper trap and scalene muscle tension, and difficulty performing tasks which involve moving his head as well as difficulty getting out of bed secondary to pain. Pt will benefit from skilled physical therapy services to address the aforementioned deficits.    Personal Factors and Comorbidities Age;Comorbidity 3+;Time since onset of injury/illness/exacerbation    Comorbidities Bladder CA, CHF, pacemaker    Examination-Activity Limitations Bed Mobility    Stability/Clinical Decision Making Evolving/Moderate complexity   Scapular pain worsened based on subjective reports   Clinical Decision Making Moderate    Rehab Potential Fair    PT Frequency 2x / week    PT Duration 6 weeks    PT Treatment/Interventions Neuromuscular re-education;Therapeutic  activities;Therapeutic exercise;Patient/family education;Manual techniques;Dry needling;Aquatic Therapy    PT Next Visit Plan scapular strengthening, posture, manual techniques    PT Home Exercise Plan Medbridge Access Code: [email or web address]    Consulted and Agree with Plan of Care Patient           Patient will benefit from skilled therapeutic intervention in order to improve the following deficits and impairments:  Pain, Postural dysfunction, Improper body mechanics, Decreased strength, Decreased range of motion  Visit Diagnosis: Cervicalgia - Plan: PT plan of care cert/re-cert  Chronic left shoulder pain - Plan: PT plan of care cert/re-cert  Muscle weakness (generalized) - Plan: PT plan of care cert/re-cert  Radiculopathy, cervical region - Plan: PT plan of care cert/re-cert     Problem List Patient Active Problem List   Diagnosis Date Noted  . CVA (cerebral infarction) 08/21/2015  . TIA (transient ischemic attack) 08/20/2015  . Acute encephalopathy 08/20/2015    Joneen Boers PT, DPT   02/20/2020, 6:34 PM  [facility] [address] [place], [address] Phone: [phone]   Fax:  [phone]  Name: ALLARD LIGHTSEY MRN: [id number] Date of Birth: Jan 19, 1941

## 2020-02-22 ENCOUNTER — Other Ambulatory Visit: Payer: Self-pay

## 2020-02-22 ENCOUNTER — Ambulatory Visit: Payer: Medicare HMO

## 2020-02-22 DIAGNOSIS — M542 Cervicalgia: Secondary | ICD-10-CM | POA: Diagnosis not present

## 2020-02-22 DIAGNOSIS — M6281 Muscle weakness (generalized): Secondary | ICD-10-CM

## 2020-02-22 DIAGNOSIS — M5412 Radiculopathy, cervical region: Secondary | ICD-10-CM

## 2020-02-22 DIAGNOSIS — G8929 Other chronic pain: Secondary | ICD-10-CM

## 2020-02-22 NOTE — Therapy (Signed)
[facility] [address] [place], [address] Phone: [phone]   Fax:  [phone]  Physical Therapy Treatment  Patient Details  Name: Dalton Obrien MRN: [id number] Date of Birth: 11/23/1940 Referring Provider (PT): Allene Dillon, NP   Encounter Date: 02/22/2020   PT End of Session - 02/22/20 1036    Visit Number 2    Number of Visits 13    Date for PT Re-Evaluation 04/04/20    Authorization Type 2    Authorization Time Period of 10    PT Start Time 1036    PT Stop Time 1118    PT Time Calculation (min) 42 min    Activity Tolerance Patient tolerated treatment well    Behavior During Therapy [facility] for tasks assessed/performed           Past Medical History:  Diagnosis Date  . Asthma   . Cancer (HCC)    Basal Cell on Face  . CHF (congestive heart failure) ([place])   . 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  There were no vitals filed for this visit.   Subjective Assessment - 02/22/20 1036    Subjective L shoulder blade is pretty well. Max pain in the past 2 days was 3-4/10. Cannot find a pattern to it. Just getting into and out of bed is the consisten pattern. Turning his head to the R and L causes L shoulder blade pain. At times, when he turns his head, theres no pain.  Starting injections 8 days from now. The injections impact the GI system predominantly. It's going to be tough.    Pertinent History Neck and back pain. Neck is the worst. L shoulder medial blade bothers him right at the bone itself. Had a shot which helped for about half a day. Had PT for L shoulder blade which improved L shoulder to 95% to 98% pain being gone. However pain gradually worsened and had to sleep on his recliner. Getting out of bed bothers him. L shoulder blade hurts when trying to get out of bed.  Pain sometimes stops immediately, at other times,  pain lingers.  Neck pain: Neck does not bother him but joints sometimes pops.  Main problem is getting out of bed.    Currently in Pain? Yes    Pain Score 3    2-3/10 L medial shoulder blade pain with cervical rotation.                                    PT Education - 02/22/20 1050    Education Details ther-ex    Person(s) Educated Patient    Methods Explanation;Demonstration;Tactile cues;Verbal cues    Comprehension Returned demonstration;Verbalized understanding          Objective     Bladder tumor returned and increased in size.   Pt states that his sciatica is fixed. Back is doing well. Returned to PT secondary to L shoulder blade pain  Pacemaker     MedbridgeAccess Code: [email or web address]  Manual therapy Seated STM R upper trap muscle to decrease tension. Decreased L medial shoulder blade pain with cervical rotation    Therapeutic exercise Seated manually resisted scapular retraction targeting lower trap muscles  L 10x5 seconds for 3 sets  R 10x3 with 5 second holds   Seated  L cervical lateral shift isometrics to improve posture, PT manual resistance 10x5 seconds for 3 sets  Give as part of HEP next visit if appropriate  L cervical rotation in neutral neck posture (PT assist to decrease R cervical lateral shift posture) 10x3  Minimal to no L scapular symptoms.   Chin tucks 10x    Improved exercise technique, movement at target joints, use of target muscles after mod verbal, visual, tactile cues.   Response to treatment Pt tolerated session well without aggravation of symptoms.    Clinical Impression Decreased L medial scapular pain after decreasing R lateral shift neck posture as well as decreasing R upper trap muscle tension. Pt will benefit from continued skilled physical therapy services to decrease pain, improve strength and function.       PT Short Term Goals - 02/20/20 1816      PT SHORT TERM GOAL #1   Title  Patient will be independent with his HEP to decrease L scapular pain to promote ability to get out of bed more comfortably.    Time 3    Period Weeks    Status New    Target Date 03/14/20             PT Long Term Goals - 02/20/20 1821      PT LONG TERM GOAL #1   Title Patient will have a decrease in L medial shoulder blade pain to 3/10 or less at worst to promote ability to get out of bed more comfortably.    Baseline 8/10 at most (02/20/2020)    Time 6    Period Weeks    Status New    Target Date 04/04/20      PT LONG TERM GOAL #2   Title Patient will improve L scapular strength by at least 1/2 MMT grade to promote ability to look around as well as get out of bed more comfortably.    Baseline Manually resisted scapular retraction 4/5 L (02/20/2020)    Time 6    Period Weeks    Status New    Target Date 04/04/20      PT LONG TERM GOAL #3   Title Patient will improve his neck FOTO score by at least 10 points as a demonstration of improved function.    Baseline Neck FOTO: 59 (02/20/2020)    Time 6    Period Weeks    Status New    Target Date 04/04/20                 Plan - 02/22/20 1050    Clinical Impression Statement Decreased L medial scapular pain after decreasing R lateral shift neck posture as well as decreasing R upper trap muscle tension. Pt will benefit from continued skilled physical therapy services to decrease pain, improve strength and function.    Personal Factors and Comorbidities Age;Comorbidity 3+;Time since onset of injury/illness/exacerbation    Comorbidities Bladder CA, CHF, pacemaker    Examination-Activity Limitations Bed Mobility    Stability/Clinical Decision Making Evolving/Moderate complexity   Scapular pain worsened based on subjective reports   Rehab Potential Fair    PT Frequency 2x / week    PT Duration 6 weeks    PT Treatment/Interventions Neuromuscular re-education;Therapeutic activities;Therapeutic exercise;Patient/family  education;Manual techniques;Dry needling;Aquatic Therapy    PT Next Visit Plan scapular strengthening, posture, manual techniques    PT Home Exercise Plan Medbridge Access Code: [email or web address]    Consulted and Agree with Plan of Care Patient  Patient will benefit from skilled therapeutic intervention in order to improve the following deficits and impairments:  Pain, Postural dysfunction, Improper body mechanics, Decreased strength, Decreased range of motion  Visit Diagnosis: Cervicalgia  Chronic left shoulder pain  Muscle weakness (generalized)  Radiculopathy, cervical region     Problem List Patient Active Problem List   Diagnosis Date Noted  . CVA (cerebral infarction) 08/21/2015  . TIA (transient ischemic attack) 08/20/2015  . Acute encephalopathy 08/20/2015    Joneen Boers PT, DPT   02/22/2020, 3:08 PM  [facility] [address], [place], [address] Phone: [phone]   Fax:  [phone]  Name: Dalton Obrien MRN: [id number] Date of Birth: 1941/01/09

## 2020-02-26 ENCOUNTER — Other Ambulatory Visit: Payer: Self-pay

## 2020-02-26 ENCOUNTER — Ambulatory Visit: Payer: Medicare HMO

## 2020-02-26 DIAGNOSIS — G8929 Other chronic pain: Secondary | ICD-10-CM

## 2020-02-26 DIAGNOSIS — M542 Cervicalgia: Secondary | ICD-10-CM | POA: Diagnosis not present

## 2020-02-26 DIAGNOSIS — M5412 Radiculopathy, cervical region: Secondary | ICD-10-CM

## 2020-02-26 DIAGNOSIS — M6281 Muscle weakness (generalized): Secondary | ICD-10-CM

## 2020-02-26 NOTE — Patient Instructions (Signed)
Pt was recommended to perform cervical rotations periodically throughout the day to decrease stiffness. Pt demonstrated and verbalized understanding.

## 2020-02-26 NOTE — Therapy (Signed)
[facility] [address], [place], [address] Phone: [phone]   Fax:  [phone]  Physical Therapy Treatment  Patient Details  Name: Dalton Obrien MRN: [id number] Date of Birth: October 25, 1940 Referring Provider (PT): Allene Dillon, NP   Encounter Date: 02/26/2020   PT End of Session - 02/26/20 0948    Visit Number 3    Number of Visits 13    Date for PT Re-Evaluation 04/04/20    Authorization Type 3    Authorization Time Period of 10    PT Start Time 0949    PT Stop Time 1031    PT Time Calculation (min) 42 min    Activity Tolerance Patient tolerated treatment well    Behavior During Therapy [facility] for tasks assessed/performed           Past Medical History:  Diagnosis Date  . Asthma   . Cancer (HCC)    Basal Cell on Face  . CHF (congestive heart failure) ([place])   . 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  There were no vitals filed for this visit.   Subjective Assessment - 02/26/20 0950    Subjective L shoulder blade is good. The pain is still random. Getting out of the bed is better for his shoulder blade.L shoulder blade is better overall a little bit.    Pertinent History Neck and back pain. Neck is the worst. L shoulder medial blade bothers him right at the bone itself. Had a shot which helped for about half a day. Had PT for L shoulder blade which improved L shoulder to 95% to 98% pain being gone. However pain gradually worsened and had to sleep on his recliner. Getting out of bed bothers him. L shoulder blade hurts when trying to get out of bed.  Pain sometimes stops immediately, at other times, pain lingers.  Neck pain: Neck does not bother him but joints sometimes pops.  Main problem is getting out of bed.    Currently in Pain? Yes    Pain Score 2                                       PT Education - 02/26/20 1006    Education Details ther-ex    Person(s) Educated Patient    Methods Explanation;Demonstration;Tactile cues;Verbal cues    Comprehension Returned demonstration;Verbalized understanding          Objective    Bladder tumor returned and increased in size.   Pt states that his sciatica is fixed. Back is doing well. Returned to PT secondary to L shoulder bladepain  Pacemaker    MedbridgeAccess Code: [email or web address]  Manual therapy Seated STM R upper trap muscle to decrease tension.  Seated STM R levator scapula muscle to decrease tension     Therapeutic exercise  Seated L cervical lateral shift isometrics to improve posture, PT manual resistance 10x5 seconds for 3 sets  Seated manually resisted scapular retraction targeting lower trap muscles             L 10x5 seconds for 3 sets             R 10x3 with 5 second holds   Wall push-up position: L weight shifting to promote isometric serratus muscle activation 10x3 with 5 seconds.  R scapular slight discomfort afterwards. Eases with rest  Chin tucks 10x3  No pain when performing  L cervical rotation in neutral neck posture (PT assist to decrease R cervical lateral shift posture) 10x3           no L scapular symptoms.      Improved exercise technique, movement at target joints, use of target muscles after mod verbal, visual, tactile cues.  Response to treatment Pt tolerated session well without aggravation of symptoms.    Clinical Impression Pt overall improving L scapular symptoms based on subjective reports of decreased discomfort when getting out of bed. Symptoms initially with L cervical rotation but decreases to 0/10 with increased L cervical rotation repetition suggesting stiffness component. Pt tolerated session well without aggravation of symptoms. Pt will benefit from continued skilled physical therapy services to decrease pain, improve strength and function.         PT Short Term Goals - 02/20/20 1816      PT SHORT TERM GOAL #1   Title Patient will be independent with his HEP to decrease L scapular pain to promote ability to get out of bed more comfortably.    Time 3    Period Weeks    Status New    Target Date 03/14/20             PT Long Term Goals - 02/20/20 1821      PT LONG TERM GOAL #1   Title Patient will have a decrease in L medial shoulder blade pain to 3/10 or less at worst to promote ability to get out of bed more comfortably.    Baseline 8/10 at most (02/20/2020)    Time 6    Period Weeks    Status New    Target Date 04/04/20      PT LONG TERM GOAL #2   Title Patient will improve L scapular strength by at least 1/2 MMT grade to promote ability to look around as well as get out of bed more comfortably.    Baseline Manually resisted scapular retraction 4/5 L (02/20/2020)    Time 6    Period Weeks    Status New    Target Date 04/04/20      PT LONG TERM GOAL #3   Title Patient will improve his neck FOTO score by at least 10 points as a demonstration of improved function.    Baseline Neck FOTO: 59 (02/20/2020)    Time 6    Period Weeks    Status New    Target Date 04/04/20                 Plan - 02/26/20 1142    Clinical Impression Statement Pt overall improving L scapular symptoms based on subjective reports of decreased discomfort when getting out of bed. Symptoms initially with L cervical rotation but decreases to 0/10 with increased L cervical rotation repetition suggesting stiffness component. Pt tolerated session well without aggravation of symptoms. Pt will benefit from continued skilled physical therapy services to decrease pain, improve strength and function.    Personal Factors and Comorbidities Age;Comorbidity 3+;Time since onset of injury/illness/exacerbation    Comorbidities Bladder CA, CHF, pacemaker    Examination-Activity Limitations Bed Mobility    Stability/Clinical Decision Making  Evolving/Moderate complexity   Scapular pain worsened based on subjective reports   Rehab Potential Fair    PT Frequency 2x / week    PT Duration 6 weeks    PT Treatment/Interventions Neuromuscular re-education;Therapeutic  activities;Therapeutic exercise;Patient/family education;Manual techniques;Dry needling;Aquatic Therapy    PT Next Visit Plan scapular strengthening, posture, manual techniques    PT Home Exercise Plan Medbridge Access Code: [email or web address]    Consulted and Agree with Plan of Care Patient           Patient will benefit from skilled therapeutic intervention in order to improve the following deficits and impairments:  Pain, Postural dysfunction, Improper body mechanics, Decreased strength, Decreased range of motion  Visit Diagnosis: Cervicalgia  Chronic left shoulder pain  Muscle weakness (generalized)  Radiculopathy, cervical region     Problem List Patient Active Problem List   Diagnosis Date Noted  . CVA (cerebral infarction) 08/21/2015  . TIA (transient ischemic attack) 08/20/2015  . Acute encephalopathy 08/20/2015    Joneen Boers PT, DPT   02/26/2020, 11:47 AM  [facility] [address] [place], [address] Phone: [phone]   Fax:  [phone]  Name: Dalton Obrien MRN: [id number] Date of Birth: 16-Oct-1940

## 2020-02-28 ENCOUNTER — Ambulatory Visit: Payer: Medicare HMO

## 2020-02-29 ENCOUNTER — Other Ambulatory Visit: Payer: Self-pay

## 2020-02-29 ENCOUNTER — Ambulatory Visit: Payer: Medicare HMO

## 2020-02-29 DIAGNOSIS — G8929 Other chronic pain: Secondary | ICD-10-CM

## 2020-02-29 DIAGNOSIS — M5412 Radiculopathy, cervical region: Secondary | ICD-10-CM

## 2020-02-29 DIAGNOSIS — M6281 Muscle weakness (generalized): Secondary | ICD-10-CM

## 2020-02-29 DIAGNOSIS — M25512 Pain in left shoulder: Secondary | ICD-10-CM

## 2020-02-29 DIAGNOSIS — M542 Cervicalgia: Secondary | ICD-10-CM | POA: Diagnosis not present

## 2020-02-29 NOTE — Therapy (Signed)
[facility] [address], [place], [address] Phone: [phone]   Fax:  [phone]  Physical Therapy Treatment  Patient Details  Name: Dalton Obrien MRN: [id number] Date of Birth: 07/23/1940 Referring Provider (PT): Allene Dillon, NP   Encounter Date: 02/29/2020   PT End of Session - 02/29/20 0941    Visit Number 4    Number of Visits 13    Date for PT Re-Evaluation 04/04/20    Authorization Type 4    Authorization Time Period of 10    PT Start Time 0902    PT Stop Time 0946    PT Time Calculation (min) 44 min    Activity Tolerance Patient tolerated treatment well    Behavior During Therapy [facility] for tasks assessed/performed           Past Medical History:  Diagnosis Date  . Asthma   . Cancer (HCC)    Basal Cell on Face  . CHF (congestive heart failure) ([place])   . 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  There were no vitals filed for this visit.   Subjective Assessment - 02/29/20 0902    Subjective Getting Pad Zev medication for his tumor tomorrow. L shoulder blade he think is better. Pt squeezes his shoulder blades before he gets out of bed which helps. Turning his head to the L bothers his L shoulder blade at times. 1/10 currently.    Pertinent History Neck and back pain. Neck is the worst. L shoulder medial blade bothers him right at the bone itself. Had a shot which helped for about half a day. Had PT for L shoulder blade which improved L shoulder to 95% to 98% pain being gone. However pain gradually worsened and had to sleep on his recliner. Getting out of bed bothers him. L shoulder blade hurts when trying to get out of bed.  Pain sometimes stops immediately, at other times, pain lingers.  Neck pain: Neck does not bother him but joints sometimes pops.  Main problem is getting out of bed.    Currently in Pain? 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   Pt states that his sciatica is fixed. Back is doing well. Returned to PT secondary to L shoulder bladepain  Pacemaker    MedbridgeAccess Code: [email or web address]  Manual therapy Seated STM R upper trap muscle to decrease tension.  Seated STM R cervical paraspinal muscles  Seated STM L upper trap muscle  Seated STM R rhomboid muscle   More neutral neck position observed afterwards    Therapeutic exercise  L cervical rotation in neutral neck posture (PT assist to decrease R cervical lateral shift posture) 10x3 no L scapular symptoms.   Seated isometric cervical nod, thumbs under chin in neutral 10x5 seconds for 3 sets   Seated manually resisted scapular retraction targeting lower trap muscles L 10x5  seconds for 3 sets R 10x3 with 5 second holds   Pt was recommended to use a towel roll to support his neck to keep it in neutral when sleeping at night. Pt demonstrated and verbalized understanding.       Improved exercise technique, movement at target joints, use of target muscles after min to mod verbal, visual, tactile cues.  Response to treatment Pt tolerated session well without aggravation of symptoms.   Clinical Impression No scapular symptoms with L cervical rotation with B scapular retraction position. Improving more neutral neck posture observed (less R lateral shift). Pt demonstrates overall decreasing scapular pain based on decreasing starting pain level reports for the past few session. Pt will benefit from continued skilled physical therapy  services to decrease pain, improve strength and function.      PT Short Term Goals - 02/20/20 1816      PT SHORT TERM GOAL #1   Title Patient will be independent with his HEP to decrease L scapular pain to promote ability to get out of bed more comfortably.    Time 3    Period Weeks    Status New    Target Date 03/14/20             PT Long Term Goals - 02/20/20 1821      PT LONG TERM GOAL #1   Title Patient will have a decrease in L medial shoulder blade pain to 3/10 or less at worst to promote ability to get out of bed more comfortably.    Baseline 8/10 at most (02/20/2020)    Time 6    Period Weeks    Status New    Target Date 04/04/20      PT LONG TERM GOAL #2   Title Patient will improve L scapular strength by at least 1/2 MMT grade to promote ability to look around as well as get out of bed more comfortably.    Baseline Manually resisted scapular retraction 4/5 L (02/20/2020)    Time 6    Period Weeks    Status New    Target Date 04/04/20      PT LONG TERM GOAL #3   Title Patient will improve his neck FOTO score by at least 10 points as a demonstration of improved function.    Baseline Neck FOTO: 59 (02/20/2020)    Time 6    Period Weeks    Status New    Target Date 04/04/20                 Plan - 02/29/20 1226    Clinical Impression Statement No scapular symptoms with L cervical rotation with B scapular retraction position. Improving more neutral neck posture observed (less R lateral shift). Pt demonstrates overall decreasing scapular pain based on decreasing starting pain level reports for the past few session. Pt will benefit from continued skilled physical therapy services to decrease pain, improve strength and function.    Personal Factors and Comorbidities Age;Comorbidity 3+;Time since onset of injury/illness/exacerbation    Comorbidities Bladder CA, CHF, pacemaker    Examination-Activity Limitations Bed Mobility    Stability/Clinical Decision Making  Evolving/Moderate complexity   Scapular pain worsened based on subjective reports   Rehab Potential Fair    PT Frequency 2x / week    PT Duration 6 weeks    PT Treatment/Interventions Neuromuscular re-education;Therapeutic activities;Therapeutic exercise;Patient/family education;Manual techniques;Dry needling;Aquatic Therapy    PT Next Visit Plan scapular strengthening, posture, manual techniques  PT Home Exercise Plan Medbridge Access Code: [email or web address]    Consulted and Agree with Plan of Care Patient           Patient will benefit from skilled therapeutic intervention in order to improve the following deficits and impairments:  Pain, Postural dysfunction, Improper body mechanics, Decreased strength, Decreased range of motion  Visit Diagnosis: Cervicalgia  Chronic left shoulder pain  Muscle weakness (generalized)  Radiculopathy, cervical region     Problem List Patient Active Problem List   Diagnosis Date Noted  . CVA (cerebral infarction) 08/21/2015  . TIA (transient ischemic attack) 08/20/2015  . Acute encephalopathy 08/20/2015    Kaari Zeigler 02/29/2020, 12:31 PM  [facility] [address] [place], [address] Phone: [phone]   Fax:  [phone]  Name: Dalton Obrien MRN: [id number] Date of Birth: 06-Sep-1940

## 2020-03-04 ENCOUNTER — Other Ambulatory Visit: Payer: Self-pay

## 2020-03-04 ENCOUNTER — Ambulatory Visit: Payer: Medicare HMO

## 2020-03-04 DIAGNOSIS — G8929 Other chronic pain: Secondary | ICD-10-CM

## 2020-03-04 DIAGNOSIS — M6281 Muscle weakness (generalized): Secondary | ICD-10-CM

## 2020-03-04 DIAGNOSIS — M542 Cervicalgia: Secondary | ICD-10-CM | POA: Diagnosis not present

## 2020-03-04 DIAGNOSIS — M5412 Radiculopathy, cervical region: Secondary | ICD-10-CM

## 2020-03-04 DIAGNOSIS — M25512 Pain in left shoulder: Secondary | ICD-10-CM

## 2020-03-04 NOTE — Therapy (Signed)
[facility] [address] [place], [address] Phone: [phone]   Fax:  [phone]  Physical Therapy Treatment  Patient Details  Name: Dalton Obrien MRN: [id number] Date of Birth: [birth year] Referring Provider (PT): Allene Dillon, NP   Encounter Date: 03/04/2020   PT End of Session - 03/04/20 1033    Visit Number 5    Number of Visits 13    Date for PT Re-Evaluation 04/04/20    Authorization Type 5    Authorization Time Period of 10    PT Start Time 1032    PT Stop Time 1113    PT Time Calculation (min) 41 min    Activity Tolerance Patient tolerated treatment well    Behavior During Therapy [facility] for tasks assessed/performed           Past Medical History:  Diagnosis Date  . Asthma   . Cancer (HCC)    Basal Cell on Face  . CHF (congestive heart failure) ([place])   . 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  There were no vitals filed for this visit.   Subjective Assessment - 03/04/20 1033    Subjective L shoulder blade is much better. Used a thicker pillow which helped support his neck. Shoulder blade feels significantly better getting out of the bed at night to use the restreoom which is virtually 0/10.    Pertinent History Neck and back pain. Neck is the worst. L shoulder medial blade bothers him right at the bone itself. Had a shot which helped for about half a day. Had PT for L shoulder blade which improved L shoulder to 95% to 98% pain being gone. However pain gradually worsened and had to sleep on his recliner. Getting out of bed bothers him. L shoulder blade hurts when trying to get out of bed.  Pain sometimes stops immediately, at other times, pain lingers.  Neck pain: Neck does not bother him but joints sometimes pops.  Main problem is getting out of bed.    Currently in Pain? 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   Pt states that his sciatica is fixed. Back is doing well. Returned to PT secondary to L shoulder bladepain  Pacemaker  Pad CEV  MedbridgeAccess Code: [email or web address]  Manual therapy Seated STM R upper trap muscle to decrease tension.  Seated gentle STM L distal scalene area  Seated STM L rhomboid muscle      Therapeutic exercise  Seated manually resisted scapular retraction targeting lower trap muscles L 10x3 with 5 second holds R 10x3 with 5 second holds  L cervical rotation in neutral neck posture (PT assist to decrease R cervical lateral shift posture) 10x3  Seated isometric cervical nod, thumbs under chin in neutral 10x5 seconds for 3 sets  L scalene stretch with PT manual assist to stabilize L first rib 15 seconds  x 5 with R cervical side bend  Then with cervical rotation R and L 10x3   No L scapular pain  Give as part of HEP next visit if appropriate    Improved exercise technique, movement at target joints, use of target muscles after min to mod verbal, visual, tactile cues.  Response to treatment Pt tolerated session well without aggravation of symptoms.   Clinical Impression No scapular symptoms with L cervical rotation with treatment to decrease L scalene muscle tension as well as with neutral neck posture. Pt will benefit from continued skilled physical therapy services to decrease pain, improve strength and function.        PT Short Term Goals - 02/20/20 1816      PT SHORT TERM GOAL #1   Title Patient will be independent with his HEP to decrease L  scapular pain to promote ability to get out of bed more comfortably.    Time 3    Period Weeks    Status New    Target Date 03/14/20             PT Long Term Goals - 02/20/20 1821      PT LONG TERM GOAL #1   Title Patient will have a decrease in L medial shoulder blade pain to 3/10 or less at worst to promote ability to get out of bed more comfortably.    Baseline 8/10 at most (02/20/2020)    Time 6    Period Weeks    Status New    Target Date 04/04/20      PT LONG TERM GOAL #2   Title Patient will improve L scapular strength by at least 1/2 MMT grade to promote ability to look around as well as get out of bed more comfortably.    Baseline Manually resisted scapular retraction 4/5 L (02/20/2020)    Time 6    Period Weeks    Status New    Target Date 04/04/20      PT LONG TERM GOAL #3   Title Patient will improve his neck FOTO score by at least 10 points as a demonstration of improved function.    Baseline Neck FOTO: 59 (02/20/2020)    Time 6    Period Weeks    Status New    Target Date 04/04/20                 Plan - 03/04/20 1059    Clinical Impression Statement No scapular symptoms with L cervical rotation with treatment to decrease L scalene muscle tension as well as with neutral neck posture. Pt will benefit from continued skilled physical therapy services to decrease pain, improve strength and function.    Personal Factors and Comorbidities Age;Comorbidity 3+;Time since onset of injury/illness/exacerbation    Comorbidities Bladder CA, CHF, pacemaker    Examination-Activity Limitations Bed Mobility    Stability/Clinical Decision Making Evolving/Moderate complexity   Scapular pain worsened based on subjective reports   Rehab Potential Fair    PT Frequency 2x / week    PT Duration 6 weeks    PT Treatment/Interventions Neuromuscular re-education;Therapeutic activities;Therapeutic exercise;Patient/family education;Manual techniques;Dry needling;Aquatic Therapy     PT Next Visit Plan scapular strengthening, posture, manual techniques    PT Home Exercise Plan Medbridge Access Code: [email or web address]    Consulted and Agree with Plan of Care Patient           Patient will benefit from skilled therapeutic intervention in order to improve the following deficits  and impairments:  Pain, Postural dysfunction, Improper body mechanics, Decreased strength, Decreased range of motion  Visit Diagnosis: Cervicalgia  Chronic left shoulder pain  Muscle weakness (generalized)  Radiculopathy, cervical region     Problem List Patient Active Problem List   Diagnosis Date Noted  . CVA (cerebral infarction) 08/21/2015  . TIA (transient ischemic attack) 08/20/2015  . Acute encephalopathy 08/20/2015    Joneen Boers PT, DPT   03/04/2020, 12:26 PM  [facility] [address] [place], [address] Phone: [phone]   Fax:  [phone]  Name: LONEY DOMINGO MRN: [id number] Date of Birth: 1941/06/16

## 2020-03-06 ENCOUNTER — Ambulatory Visit: Payer: Medicare HMO | Attending: Family Medicine

## 2020-03-06 ENCOUNTER — Other Ambulatory Visit: Payer: Self-pay

## 2020-03-06 ENCOUNTER — Telehealth: Payer: Self-pay

## 2020-03-06 DIAGNOSIS — M6281 Muscle weakness (generalized): Secondary | ICD-10-CM

## 2020-03-06 DIAGNOSIS — M5412 Radiculopathy, cervical region: Secondary | ICD-10-CM

## 2020-03-06 DIAGNOSIS — M25512 Pain in left shoulder: Secondary | ICD-10-CM | POA: Insufficient documentation

## 2020-03-06 DIAGNOSIS — M542 Cervicalgia: Secondary | ICD-10-CM | POA: Diagnosis not present

## 2020-03-06 DIAGNOSIS — G8929 Other chronic pain: Secondary | ICD-10-CM | POA: Diagnosis present

## 2020-03-06 NOTE — Therapy (Signed)
[facility] [address], [place], [address] Phone: [phone]   Fax:  [phone]  Physical Therapy Treatment  Patient Details  Name: Dalton Obrien MRN: [id number] Date of Birth: 13-Mar-1941 Referring Provider (PT): Allene Dillon, NP   Encounter Date: 03/06/2020   PT End of Session - 03/06/20 1100    Visit Number 6    Number of Visits 13    Date for PT Re-Evaluation 04/04/20    Authorization Type 6    Authorization Time Period of 10    PT Start Time 1100    PT Stop Time 1144    PT Time Calculation (min) 44 min    Activity Tolerance Patient tolerated treatment well    Behavior During Therapy [facility] for tasks assessed/performed           Past Medical History:  Diagnosis Date  . Asthma   . Cancer (HCC)    Basal Cell on Face  . CHF (congestive heart failure) ([place])   . 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  There were no vitals filed for this visit.   Subjective Assessment - 03/06/20 1101    Subjective Everything is better everyday. No pain wiht turning his head this morning. 0.5/10 pain with L cervical rotation, no pain with R cervical rotation.    Pertinent History Neck and back pain. Neck is the worst. L shoulder medial blade bothers him right at the bone itself. Had a shot which helped for about half a day. Had PT for L shoulder blade which improved L shoulder to 95% to 98% pain being gone. However pain gradually worsened and had to sleep on his recliner. Getting out of bed bothers him. L shoulder blade hurts when trying to get out of bed.  Pain sometimes stops immediately, at other times, pain lingers.  Neck pain: Neck does not bother him but joints sometimes pops.  Main problem is getting out of bed.    Currently in Pain? 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   Pt states that his sciatica is fixed. Back is doing well. Returned to PT secondary to L shoulder bladepain  Pacemaker  Pad CEV  MedbridgeAccess Code: [email or web address]  Manual therapy Seated STM L levator scapular and distal scalene area to decrease tension   0/10 shoulder blade pain with R and L cervical rotation   Seated STM R upper trap muscle to decrease tension.  Seated STM L and R  rhomboid muscle to decrease tension     Therapeutic exercise standing R cervical side bend stretch 15 seconds x 5 to stretch L scalene muscles   No decreased in pain  L scalene stretch with PT manual assist to stabilize L first rib 15 seconds x 5 with R cervical side bend  Seated manually resisted scapular retraction targeting lower trap muscles L 10x3 with  5 second holds R 10x3 with 5 second holds  L cervical rotation in neutral neck posture (PT assist to decrease R cervical lateral shift posture) 10x3  Seated chin tucks with B scapular retraction 10x3 with 5 second holds to promote thoracic extension and decrease lower cervical stress.     Improved exercise technique, movement at target joints, use of target muscles aftermin tomod verbal, visual, tactile cues.  Response to treatment Pt tolerated session well without aggravation of symptoms.   Clinical Impression  Pt making very good progress with decreased L medial shoulder blade pain with cervical rotation and with getting into and out of bed based on subjective reports and starting pain levels the past few sessions. Continued working on decreasing muscle tension periscapular and cervical area,  improving more neutral cervical posture, increasing thoracic mobility to decrease lower cervical stress, and improving lower trap strength to help decrease upper trap muscle tension. Pt tolerated session well without aggravation of symptoms. Pt will benefit from continued skilled physical therapy services to decrease pain, improve function.        PT Short Term Goals - 02/20/20 1816      PT SHORT TERM GOAL #1   Title Patient will be independent with his HEP to decrease L scapular pain to promote ability to get out of bed more comfortably.    Time 3    Period Weeks    Status New    Target Date 03/14/20             PT Long Term Goals - 02/20/20 1821      PT LONG TERM GOAL #1   Title Patient will have a decrease in L medial shoulder blade pain to 3/10 or less at worst to promote ability to get out of bed more comfortably.    Baseline 8/10 at most (02/20/2020)    Time 6    Period Weeks    Status New    Target Date 04/04/20      PT LONG TERM GOAL #2   Title Patient will improve L scapular strength by at least 1/2 MMT grade to promote ability to look around as well as get out of bed more comfortably.    Baseline Manually resisted scapular retraction 4/5 L (02/20/2020)    Time 6    Period Weeks    Status New    Target Date 04/04/20      PT LONG TERM GOAL #3   Title Patient will improve his neck FOTO score by at least 10 points as a demonstration of improved function.    Baseline Neck FOTO: 59 (02/20/2020)    Time 6    Period Weeks    Status New    Target Date 04/04/20                 Plan - 03/06/20 1101    Clinical Impression Statement Pt making very good progress with decreased L medial shoulder blade pain with cervical rotation and with getting into and out of bed based on subjective reports and starting pain levels the past few sessions. Continued working on decreasing muscle tension periscapular and cervical area, improving more neutral cervical posture, increasing  thoracic mobility to decrease lower cervical stress, and improving lower trap strength to help decrease upper trap muscle tension. Pt tolerated session well without aggravation of symptoms. Pt will benefit from continued skilled physical therapy services to decrease pain, improve function.    Personal Factors and Comorbidities Age;Comorbidity 3+;Time since onset  of injury/illness/exacerbation    Comorbidities Bladder CA, CHF, pacemaker    Examination-Activity Limitations Bed Mobility    Stability/Clinical Decision Making Evolving/Moderate complexity   Scapular pain worsened based on subjective reports   Rehab Potential Fair    PT Frequency 2x / week    PT Duration 6 weeks    PT Treatment/Interventions Neuromuscular re-education;Therapeutic activities;Therapeutic exercise;Patient/family education;Manual techniques;Dry needling;Aquatic Therapy    PT Next Visit Plan scapular strengthening, posture, manual techniques    PT Home Exercise Plan Medbridge Access Code: [email or web address]    Consulted and Agree with Plan of Care Patient           Patient will benefit from skilled therapeutic intervention in order to improve the following deficits and impairments:  Pain, Postural dysfunction, Improper body mechanics, Decreased strength, Decreased range of motion  Visit Diagnosis: Cervicalgia  Chronic left shoulder pain  Muscle weakness (generalized)  Radiculopathy, cervical region     Problem List Patient Active Problem List   Diagnosis Date Noted  . CVA (cerebral infarction) 08/21/2015  . TIA (transient ischemic attack) 08/20/2015  . Acute encephalopathy 08/20/2015   Joneen Boers PT, DPT   03/06/2020, 11:59 AM  [facility] [address] [place], [address] Phone: [phone]   Fax:  [phone]  Name: Dalton Obrien MRN: [id number] Date of Birth: 05-Apr-1941

## 2020-03-06 NOTE — Telephone Encounter (Signed)
No show. Called patient and left a message pertaining to appointment. Return phone call requested. Phone number [phone]) provided.

## 2020-03-07 ENCOUNTER — Ambulatory Visit: Payer: Medicare HMO

## 2020-03-13 ENCOUNTER — Other Ambulatory Visit: Payer: Self-pay

## 2020-03-13 ENCOUNTER — Ambulatory Visit: Payer: Medicare HMO

## 2020-03-13 DIAGNOSIS — M5412 Radiculopathy, cervical region: Secondary | ICD-10-CM

## 2020-03-13 DIAGNOSIS — M542 Cervicalgia: Secondary | ICD-10-CM | POA: Diagnosis not present

## 2020-03-13 DIAGNOSIS — M6281 Muscle weakness (generalized): Secondary | ICD-10-CM

## 2020-03-13 DIAGNOSIS — M25512 Pain in left shoulder: Secondary | ICD-10-CM

## 2020-03-13 DIAGNOSIS — G8929 Other chronic pain: Secondary | ICD-10-CM

## 2020-03-13 NOTE — Therapy (Signed)
[facility] [address] [place], [address] Phone: [phone]   Fax:  [phone]  Physical Therapy Treatment  Patient Details  Name: Dalton Obrien MRN: [id number] Date of Birth: 05-17-1941 Referring Provider (PT): Allene Dillon, NP   Encounter Date: 03/13/2020   PT End of Session - 03/13/20 1034    Visit Number 7    Number of Visits 13    Date for PT Re-Evaluation 04/04/20    Authorization Type 7    Authorization Time Period of 10    PT Start Time 1034    PT Stop Time 1115    PT Time Calculation (min) 41 min    Activity Tolerance Patient tolerated treatment well    Behavior During Therapy [facility] for tasks assessed/performed           Past Medical History:  Diagnosis Date   Asthma    Cancer ([place])    Basal Cell on Face   CHF (congestive heart failure) ([place])    Infiltrative basal cell 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  There were no vitals filed for this visit.   Subjective Assessment - 03/13/20 1035    Subjective L shoulder blade is good. Pain is rare. Can feel it occasionally and randomly. Has been squeezing his shoulder blades in the car. Looks his head L and R. Pain has been 0.25/10 at worst.  The Pad Cev is really kicking in now with the itching side effects.    Pertinent History Neck and back pain. Neck is the worst. L shoulder medial blade bothers him right at the bone itself. Had a shot which helped for about half a day. Had PT for L shoulder blade which improved L shoulder to 95% to 98% pain being gone. However pain gradually worsened and had to sleep on his recliner. Getting out of bed bothers him. L shoulder blade hurts when trying to get out of bed.  Pain sometimes stops immediately, at other times, pain lingers.  Neck pain: Neck does not bother him but joints sometimes pops.  Main problem is getting out of bed.    Currently in  Pain? 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   Pt states that his sciatica is fixed. Back is doing well. Returned to PT secondary to L shoulder bladepain  Pacemaker  Pad CEV  MedbridgeAccess Code: [email or web address]  Manual therapy Seated STM L levator scapula and distal scalene area to decrease tension             STM L posterior lateral neck to decrease fascial restrictions  Seated STM R upper trap muscle to decrease tension.  Seated STML and R rhomboid muscle to decrease tension     Therapeutic exercise L scalene stretch with PT manual assist to stabilize L first rib 15 seconds x 5 with R cervical side bend  Seated manually resisted scapular retraction targeting lower trap muscles L 10x3  with 5 second holds   Decreased L scalene muscle tension observed during contraction   L cervical rotation in neutral neck posture (PT assist to decrease R cervical lateral shift posture) 10x3   Seated chin tucks with B scapular retraction 10x with 5 second holds to promote thoracic extension and decrease lower cervical stress.       Improved exercise technique, movement at target joints, use of target muscles aftermin tomod verbal, visual, tactile cues.  Response to treatment Pt tolerated session well without aggravation of symptoms.   Clinical Impression Pt making very good progress with decreased pain with reports of 0.25/10 at most. Continued working on decreasing muscle tension around his neck and B scapulae as well as decreasing lower cervical extension stress and improving L lower trap strength to promote proper  cervical mechanics.  Pt tolerated session well without aggravation of symptoms. Pt will benefit from continued skilled physical therapy services to decrease pain, improve strength and function.          PT Short Term Goals - 02/20/20 1816      PT SHORT TERM GOAL #1   Title Patient will be independent with his HEP to decrease L scapular pain to promote ability to get out of bed more comfortably.    Time 3    Period Weeks    Status New    Target Date 03/14/20             PT Long Term Goals - 02/20/20 1821      PT LONG TERM GOAL #1   Title Patient will have a decrease in L medial shoulder blade pain to 3/10 or less at worst to promote ability to get out of bed more comfortably.    Baseline 8/10 at most (02/20/2020)    Time 6    Period Weeks    Status New    Target Date 04/04/20      PT LONG TERM GOAL #2   Title Patient will improve L scapular strength by at least 1/2 MMT grade to promote ability to look around as well as get out of bed more comfortably.    Baseline Manually resisted scapular retraction 4/5 L (02/20/2020)    Time 6    Period Weeks    Status New    Target Date 04/04/20      PT LONG TERM GOAL #3   Title Patient will improve his neck FOTO score by at least 10 points as a demonstration of improved function.    Baseline Neck FOTO: 59 (02/20/2020)    Time 6    Period Weeks    Status New    Target Date 04/04/20                 Plan - 03/13/20 1056    Clinical Impression Statement Pt making very good progress with decreased pain with reports of 0.25/10 at most. Continued working on decreasing muscle tension around his neck and B scapulae as well as decreasing lower cervical extension stress and improving L lower trap strength to promote proper cervical mechanics.  Pt tolerated session well without aggravation of symptoms. Pt will benefit from continued skilled physical therapy services to decrease pain, improve strength and function.    Personal Factors  and Comorbidities Age;Comorbidity 3+;Time since onset of injury/illness/exacerbation    Comorbidities Bladder CA, CHF, pacemaker    Examination-Activity Limitations Bed Mobility    Stability/Clinical Decision Making Evolving/Moderate complexity   Scapular pain worsened based on subjective reports  Rehab Potential Fair    PT Frequency 2x / week    PT Duration 6 weeks    PT Treatment/Interventions Neuromuscular re-education;Therapeutic activities;Therapeutic exercise;Patient/family education;Manual techniques;Dry needling;Aquatic Therapy    PT Next Visit Plan scapular strengthening, posture, manual techniques    PT Home Exercise Plan Medbridge Access Code: [email or web address]    Consulted and Agree with Plan of Care Patient           Patient will benefit from skilled therapeutic intervention in order to improve the following deficits and impairments:  Pain, Postural dysfunction, Improper body mechanics, Decreased strength, Decreased range of motion  Visit Diagnosis: Cervicalgia  Chronic left shoulder pain  Muscle weakness (generalized)  Radiculopathy, cervical region     Problem List Patient Active Problem List   Diagnosis Date Noted   CVA (cerebral infarction) 08/21/2015   TIA (transient ischemic attack) 08/20/2015   Acute encephalopathy 08/20/2015   Joneen Boers PT, DPT   03/13/2020, 11:28 AM  [facility] [address] [place], [address] Phone: [phone]   Fax:  [phone]  Name: Dalton Obrien MRN: [id number] Date of Birth: [birth year]

## 2020-03-19 ENCOUNTER — Other Ambulatory Visit: Payer: Self-pay

## 2020-03-19 ENCOUNTER — Ambulatory Visit: Payer: Medicare HMO

## 2020-03-19 DIAGNOSIS — M542 Cervicalgia: Secondary | ICD-10-CM | POA: Diagnosis not present

## 2020-03-19 DIAGNOSIS — M6281 Muscle weakness (generalized): Secondary | ICD-10-CM

## 2020-03-19 DIAGNOSIS — G8929 Other chronic pain: Secondary | ICD-10-CM

## 2020-03-19 DIAGNOSIS — M5412 Radiculopathy, cervical region: Secondary | ICD-10-CM

## 2020-03-19 NOTE — Therapy (Signed)
[facility] [address] [place], [address] Phone: [phone]   Fax:  [phone]  Physical Therapy Treatment  Patient Details  Name: Dalton Obrien MRN: [id number] Date of Birth: 1941/06/20 Referring Provider (PT): Allene Dillon, NP   Encounter Date: 03/19/2020   PT End of Session - 03/19/20 0852    Visit Number 8    Number of Visits 13    Date for PT Re-Evaluation 04/04/20    Authorization Type 8    Authorization Time Period of 10    PT Start Time 0851    PT Stop Time 0933    PT Time Calculation (min) 42 min    Activity Tolerance Patient tolerated treatment well    Behavior During Therapy [facility] for tasks assessed/performed           Past Medical History:  Diagnosis Date  . Asthma   . Cancer ([facility])    Basal Cell on Face  . CHF (congestive heart failure) ([place])   . 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  There were no vitals filed for this visit.   Subjective Assessment - 03/19/20 0856    Subjective L shoulder blade is excellent. Occasionally feels a 1/10. Does his HEP. Muscle L lateral neck still kind of stiff.  Does scapular retractions prior to getting out of bed which helps ability to get out of bed comfortably.    Pertinent History Neck and back pain. Neck is the worst. L shoulder medial blade bothers him right at the bone itself. Had a shot which helped for about half a day. Had PT for L shoulder blade which improved L shoulder to 95% to 98% pain being gone. However pain gradually worsened and had to sleep on his recliner. Getting out of bed bothers him. L shoulder blade hurts when trying to get out of bed.  Pain sometimes stops immediately, at other times, pain lingers.  Neck pain: Neck does not bother him but joints sometimes pops.  Main problem is getting out of bed.    Currently in Pain? 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   Pt states that his sciatica is fixed. Back is doing well. Returned to PT secondary to L shoulder bladepain  Pacemaker  Pad CEV medication for bladder CA  MedbridgeAccess Code: [email or web address]  Manual therapy Seated STM L levator scapula and distal scalene area to decrease tension   STM L posterior lateral neck to decrease fascial restrictions  Seated STM R upper trap muscle to decrease tension.  Seated STMLand Rrhomboid muscleto decrease tension     Therapeutic exercise   Seated manually resisted scapular retraction targeting lower trap muscles L 10x3 with 5 second holds  L cervical rotation in neutral neck posture (PT assist to decrease R cervical lateral shift posture) 10x3  L scalene stretch with PT manual assist to stabilize L  first rib 15 seconds x 5 with R cervical side bend                         Improved exercise technique, movement at target joints, use of target muscles aftermin tomod verbal, visual, tactile cues.  Response to treatment Pt tolerated session well without aggravation of symptoms.   Clinical Impression Improving level of comfort for L medial scapula based on subjective reports. Continued working on decreasing muscle tension to his upper traps, rhomboid, L levator scapula and scalene muscles as well as promoting lower scapular strength and more neutral cervical posture to decrease stress to nerves exiting L cervical spine to decrease L medial scapular discomfort. Pt tolerated session well without aggravation of symptoms. Pt will benefit from continued  skilled physical therapy services to decrease pain, improve ability to look around as well as to get into and out of his bed more comfortably.      PT Short Term Goals - 02/20/20 1816      PT SHORT TERM GOAL #1   Title Patient will be independent with his HEP to decrease L scapular pain to promote ability to get out of bed more comfortably.    Time 3    Period Weeks    Status New    Target Date 03/14/20             PT Long Term Goals - 02/20/20 1821      PT LONG TERM GOAL #1   Title Patient will have a decrease in L medial shoulder blade pain to 3/10 or less at worst to promote ability to get out of bed more comfortably.    Baseline 8/10 at most (02/20/2020)    Time 6    Period Weeks    Status New    Target Date 04/04/20      PT LONG TERM GOAL #2   Title Patient will improve L scapular strength by at least 1/2 MMT grade to promote ability to look around as well as get out of bed more comfortably.    Baseline Manually resisted scapular retraction 4/5 L (02/20/2020)    Time 6    Period Weeks    Status New    Target Date 04/04/20      PT LONG TERM GOAL #3   Title Patient will improve his neck FOTO score by at least 10 points as a demonstration of improved function.    Baseline Neck FOTO: 59 (02/20/2020)    Time 6    Period Weeks    Status New    Target Date 04/04/20                 Plan - 03/19/20 0948    Clinical Impression Statement Improving level of comfort for L medial scapula based on subjective reports. Continued working on decreasing muscle tension to his upper traps, rhomboid, L levator scapula and scalene muscles as well as promoting lower scapular strength and more neutral cervical posture to decrease stress to nerves exiting L cervical spine to decrease L medial scapular discomfort. Pt tolerated session well without aggravation of symptoms. Pt will benefit from continued skilled physical therapy services to decrease pain, improve ability to look around as  well as to get into and out of his bed more comfortably.    Personal Factors and Comorbidities Age;Comorbidity 3+;Time since onset of injury/illness/exacerbation    Comorbidities Bladder CA, CHF, pacemaker    Examination-Activity Limitations Bed Mobility  Stability/Clinical Decision Making Evolving/Moderate complexity   Scapular pain worsened based on subjective reports   Rehab Potential Fair    PT Frequency 2x / week    PT Duration 6 weeks    PT Treatment/Interventions Neuromuscular re-education;Therapeutic activities;Therapeutic exercise;Patient/family education;Manual techniques;Dry needling;Aquatic Therapy    PT Next Visit Plan scapular strengthening, posture, manual techniques    PT Home Exercise Plan Medbridge Access Code: [email or web address]    Consulted and Agree with Plan of Care Patient           Patient will benefit from skilled therapeutic intervention in order to improve the following deficits and impairments:  Pain, Postural dysfunction, Improper body mechanics, Decreased strength, Decreased range of motion  Visit Diagnosis: Cervicalgia  Chronic left shoulder pain  Muscle weakness (generalized)  Radiculopathy, cervical region     Problem List Patient Active Problem List   Diagnosis Date Noted  . CVA (cerebral infarction) 08/21/2015  . TIA (transient ischemic attack) 08/20/2015  . Acute encephalopathy 08/20/2015    Joneen Boers PT, DPT   03/19/2020, 9:55 AM  [facility] [address] [place], [address] Phone: [phone]   Fax:  [phone]  Name: Dalton Obrien MRN: [id number] Date of Birth: 1941-03-06

## 2020-03-26 ENCOUNTER — Ambulatory Visit: Payer: Medicare HMO

## 2020-03-26 ENCOUNTER — Other Ambulatory Visit: Payer: Self-pay

## 2020-03-26 DIAGNOSIS — M25512 Pain in left shoulder: Secondary | ICD-10-CM

## 2020-03-26 DIAGNOSIS — M542 Cervicalgia: Secondary | ICD-10-CM | POA: Diagnosis not present

## 2020-03-26 DIAGNOSIS — M5412 Radiculopathy, cervical region: Secondary | ICD-10-CM

## 2020-03-26 DIAGNOSIS — G8929 Other chronic pain: Secondary | ICD-10-CM

## 2020-03-26 DIAGNOSIS — M6281 Muscle weakness (generalized): Secondary | ICD-10-CM

## 2020-03-26 NOTE — Therapy (Signed)
[facility] [address] [place], [address] Phone: [phone]   Fax:  [phone]  Physical Therapy Treatment  Patient Details  Name: Dalton Obrien MRN: [id number] Date of Birth: 1941-06-15 Referring Provider (PT): Allene Dillon, NP   Encounter Date: 03/26/2020   PT End of Session - 03/26/20 0932    Visit Number 9    Number of Visits 13    Date for PT Re-Evaluation 04/04/20    Authorization Type 9    Authorization Time Period of 10    PT Start Time 0932    PT Stop Time 1016    PT Time Calculation (min) 44 min    Activity Tolerance Patient tolerated treatment well    Behavior During Therapy [facility] for tasks assessed/performed           Past Medical History:  Diagnosis Date   Asthma    Cancer ([place])    Basal Cell on Face   CHF (congestive heart failure) ([place])    Infiltrative basal cell 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  There were no vitals filed for this visit.   Subjective Assessment - 03/26/20 0933    Subjective L shoulder blade is about 99.5% good. Rarely feels the pain/symptoms. Also careful with it. The pain would only be 1 to 1.5/10 when it does happen. The pain is not consistent.  Better able to turn his neck to look around when driving.  The bladder is also better but having side effects from the medicine.  Pt states that  he is getting a lot better,    Pertinent History Neck and back pain. Neck is the worst. L shoulder medial blade bothers him right at the bone itself. Had a shot which helped for about half a day. Had PT for L shoulder blade which improved L shoulder to 95% to 98% pain being gone. However pain gradually worsened and had to sleep on his recliner. Getting out of bed bothers him. L shoulder blade hurts when trying to get out of bed.  Pain sometimes stops immediately, at other times, pain lingers.  Neck pain: Neck  does not bother him but joints sometimes pops.  Main problem is getting out of bed.    Currently in Pain? 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   Pt states that his sciatica is fixed. Back is doing well. Returned to PT secondary to L shoulder bladepain  Pacemaker  Pad CEV medication for bladder CA  MedbridgeAccess Code: [email or web address]  Manual therapy Seated STM L levator scapula and distal scalene area to decrease tension  STM L posterior lateral neck to decrease fascial restrictions   Seated STM R upper trap muscle to decrease tension   Therapeutic exercise   Seated manually resisted scapular retraction targeting lower trap muscles L 10x3 with 5 second holds  L scalene stretch with PT manual assist  to stabilize L first rib 15 seconds x 5 with R cervical side bend   L cervical rotation in neutral neck posture (PT assist to decrease R cervical lateral shift posture) 10x3  Chin tucks 10x3 with 5 second holds  Seated manually resisted L scapular depression isometrics, PT manual resistance  Diaphragmatic breathing to decrease L scalene tension 10x2     Improved exercise technique, movement at target joints, use of target muscles aftermin tomod verbal, visual, tactile cues.  Response to treatment Pt tolerated session well without aggravation of symptoms.   Clinical Impression Improving overall comfort level with medial shoulder blade with worst pain level being 1.5/10 only on rare occasions now. Pt making very good progress towards pain goal. Continued working on  decreasing muscle tension as well as improving scapular strength, and thoracic extension to decrease stress to his lower cervical spine. Pt will benefit from continued skilled physical therapy services to decrease pain, improve strength and function.       PT Short Term Goals - 02/20/20 1816      PT SHORT TERM GOAL #1   Title Patient will be independent with his HEP to decrease L scapular pain to promote ability to get out of bed more comfortably.    Time 3    Period Weeks    Status New    Target Date 03/14/20             PT Long Term Goals - 02/20/20 1821      PT LONG TERM GOAL #1   Title Patient will have a decrease in L medial shoulder blade pain to 3/10 or less at worst to promote ability to get out of bed more comfortably.    Baseline 8/10 at most (02/20/2020)    Time 6    Period Weeks    Status New    Target Date 04/04/20      PT LONG TERM GOAL #2   Title Patient will improve L scapular strength by at least 1/2 MMT grade to promote ability to look around as well as get out of bed more comfortably.    Baseline Manually resisted scapular retraction 4/5 L (02/20/2020)    Time 6    Period Weeks    Status New    Target Date 04/04/20      PT LONG TERM GOAL #3   Title Patient will improve his neck FOTO score by at least 10 points as a demonstration of improved function.    Baseline Neck FOTO: 59 (02/20/2020)    Time 6    Period Weeks    Status New    Target Date 04/04/20                 Plan - 03/26/20 1333    Clinical Impression Statement Improving overall comfort level with medial shoulder blade with worst pain level being 1.5/10 only on rare occasions now. Pt making very good progress towards pain goal. Continued working on decreasing muscle tension as well as improving scapular strength, and thoracic extension to decrease stress to his lower cervical spine. Pt will benefit from continued skilled physical therapy services to decrease pain, improve strength and  function.    Personal Factors and Comorbidities Age;Comorbidity 3+;Time since onset of injury/illness/exacerbation    Comorbidities Bladder CA, CHF, pacemaker    Examination-Activity Limitations Bed Mobility    Stability/Clinical Decision Making --   Scapular pain worsened based on subjective reports   Rehab Potential Fair    PT  Frequency 2x / week    PT Duration 6 weeks    PT Treatment/Interventions Neuromuscular re-education;Therapeutic activities;Therapeutic exercise;Patient/family education;Manual techniques;Dry needling;Aquatic Therapy    PT Next Visit Plan scapular strengthening, posture, manual techniques    PT Home Exercise Plan Medbridge Access Code: [email or web address]    Consulted and Agree with Plan of Care Patient           Patient will benefit from skilled therapeutic intervention in order to improve the following deficits and impairments:  Pain, Postural dysfunction, Improper body mechanics, Decreased strength, Decreased range of motion  Visit Diagnosis: Cervicalgia  Chronic left shoulder pain  Muscle weakness (generalized)  Radiculopathy, cervical region     Problem List Patient Active Problem List   Diagnosis Date Noted   CVA (cerebral infarction) 08/21/2015   TIA (transient ischemic attack) 08/20/2015   Acute encephalopathy 08/20/2015     Joneen Boers PT, DPT  03/26/2020, 1:39 PM  [facility] [address], [place], [address] Phone: [phone]   Fax:  [phone]  Name: Dalton Obrien MRN: [id number] Date of Birth: 1941/02/15

## 2020-04-02 ENCOUNTER — Ambulatory Visit: Payer: Medicare HMO

## 2020-04-02 ENCOUNTER — Other Ambulatory Visit: Payer: Self-pay

## 2020-04-02 DIAGNOSIS — M542 Cervicalgia: Secondary | ICD-10-CM

## 2020-04-02 DIAGNOSIS — M5412 Radiculopathy, cervical region: Secondary | ICD-10-CM

## 2020-04-02 DIAGNOSIS — M6281 Muscle weakness (generalized): Secondary | ICD-10-CM

## 2020-04-02 DIAGNOSIS — G8929 Other chronic pain: Secondary | ICD-10-CM

## 2020-04-02 NOTE — Patient Instructions (Signed)
Access Code: [email or web address] URL: [email or web address] Date: 04/02/2020 Prepared by: Joneen Boers  Exercises Seated Hip Internal Rotation AROM - 2 x daily - 7 x weekly - 5 reps - 6 sets Seated Thoracic Lumbar Extension - 1 x daily - 7 x weekly - 10 reps - 3 sets - 5 seconds hold Standing Hip Abduction with Counter Support - 1 x daily - 7 x weekly - 10 reps - 3 sets Seated Cervical Retraction - 1 x daily - 7 x weekly - 10 reps - 3 sets - 5 seconds hold Standing Isometric Shoulder External Rotation with Doorway - 1 x daily - 7 x weekly - 3 sets - 10 reps - 5 seconds hold Seated Isometric Elbow Extension - 1 x daily - 7 x weekly - 3 sets - 10 reps - 5 seconds hold Seated Single Arm Shoulder Horizontal Abduction with Anchored Resistance on Swiss Ball - 1 x daily - 7 x weekly - 2 sets - 10 reps Isometric Shoulder Extension with Ball at [employer] - 1 x daily - 7 x weekly - 3 sets - 10 reps - 5 seconds hold Scapular Retraction with Resistance - 1 x daily - 7 x weekly - 3 sets - 10 reps - 5 seconds hold Seated Isometric Cervical Sidebending - 1 x daily - 7 x weekly - 3 sets - 10 reps - 5 seconds hold Shoulder External Rotation with Resistance - 1 x daily - 7 x weekly - 3 sets - 10 reps Seated Diaphragmatic Breathing - 1 x daily - 7 x weekly - 3 sets - 10 reps

## 2020-04-02 NOTE — Therapy (Signed)
[facility] [address], [place], [address] Phone: [phone]   Fax:  [phone]  Physical Therapy Treatment Progress Report (02/20/2020 - 04/02/2020) and Discharge Summary  Patient Details  Name: Dalton Obrien MRN: [id number] Date of Birth: 02-Apr-1941 Referring Provider (PT): Allene Dillon, NP   Encounter Date: 04/02/2020   PT End of Session - 04/02/20 0936    Visit Number 10    Number of Visits 13    Date for PT Re-Evaluation 04/04/20    Authorization Type 10    Authorization Time Period of 10    PT Start Time 0936    PT Stop Time 3500    PT Time Calculation (min) 38 min    Activity Tolerance Patient tolerated treatment well    Behavior During Therapy [facility] for tasks assessed/performed           Past Medical History:  Diagnosis Date   Asthma    Cancer ([place])    Basal Cell on Face   CHF (congestive heart failure) ([place])    Infiltrative basal cell 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  There were no vitals filed for this visit.   Subjective Assessment - 04/02/20 0937    Subjective L shoulder blade feels excellent. Has at least been 3-4 days since he felt anyting there. Only happens when he twists in the bed. The Shoulder blade and bladder are excellent. Had his 4th injection this past Friday.  His hair started falling out. Can't walk 20 minutes now and taste is virtually gone due to his injections.  Feels like he is going to get worse due to the medication.  Feels like his legs are going to get weaker in the next 2 weeks. Continues to do his exercises on his back. Will need to work on strengthening eventually because he is going to get weaker.    Pertinent History Neck and back pain. Neck is the worst. L shoulder medial blade bothers him right at the bone itself. Had a shot which helped for about half a day. Had PT for L shoulder blade  which improved L shoulder to 95% to 98% pain being gone. However pain gradually worsened and had to sleep on his recliner. Getting out of bed bothers him. L shoulder blade hurts when trying to get out of bed.  Pain sometimes stops immediately, at other times, pain lingers.  Neck pain: Neck does not bother him but joints sometimes pops.  Main problem is getting out of bed.    Currently in Pain? No/denies                                     PT Education - 04/02/20 0940    Education Details ther-ex    Person(s) Educated Patient    Methods Explanation;Demonstration;Tactile cues;Verbal cues    Comprehension Returned demonstration;Verbalized understanding          Objective    Bladder tumor returned and increased in size.   Pt states that his sciatica is fixed. Back is doing well. Returned to PT secondary to L shoulder bladepain  Pacemaker  Pad CEVmedication for bladder CA   Back, neck, sciatica are better    MedbridgeAccess Code: [email or web address]    Therapeutic exercise  Time spent listening to pt subjective   Seated manually resisted  scapular retraction targeting lower trap muscles L 10x3 with 5 second holds  Reviewed progress with scapular strength with pt  L scalene stretch with PT manual assist to stabilize L first rib 15 seconds x 5 with R cervical side bend    L cervical rotation in neutral neck posture (PT assist to decrease R cervical lateral shift posture) 10x3  Diaphragmatic breathing to decrease L scalene tension   Increased time spent secondary to emphasis on proper technique  Seated manually resisted L scapular depression isometrics, PT manual resistance 10x3 with 5 second holds    Improved exercise technique, movement at target joints, use of target muscles aftermin tomod verbal, visual, tactile cues.  Response to treatment Pt tolerated session well without aggravation of symptoms.   Clinical  Impression Pt demonstrates significant improvement in neck/shoulder blade pain and improved scapular strength and slight improved function since initial evaluation. Pt also demonstrates consistency and independence with his HEP. Skilled physical therapy services discharged with patient continuing with his exercises at home. Challenges to progress include weakness along with other side effects from his cancer medication.       PT Short Term Goals - 04/02/20 0947      PT SHORT TERM GOAL #1   Title Patient will be independent with his HEP to decrease L scapular pain to promote ability to get out of bed more comfortably.    Baseline Pt independent wiht his HEP (04/02/2020)    Time 3    Period Weeks    Status Achieved    Target Date 03/14/20             PT Long Term Goals - 04/02/20 0948      PT LONG TERM GOAL #1   Title Patient will have a decrease in L medial shoulder blade pain to 3/10 or less at worst to promote ability to get out of bed more comfortably.    Baseline 8/10 at most (02/20/2020); 0/10 at most for the past 7 days (04/02/2020)    Time 6    Period Weeks    Status Achieved    Target Date 04/04/20      PT LONG TERM GOAL #2   Title Patient will improve L scapular strength by at least 1/2 MMT grade to promote ability to look around as well as get out of bed more comfortably.    Baseline Manually resisted scapular retraction 4/5 L (02/20/2020); 4+/5 L (04/02/2020)    Time 6    Period Weeks    Status Achieved    Target Date 04/04/20      PT LONG TERM GOAL #3   Title Patient will improve his neck FOTO score by at least 10 points as a demonstration of improved function.    Baseline Neck FOTO: 59 (02/20/2020); 62 (04/02/2020)    Time 6    Period Weeks    Status Partially Met    Target Date 04/04/20                 Plan - 04/02/20 1029    Clinical Impression Statement Pt demonstrates significant improvement in neck/shoulder blade pain and improved scapular strength  and slight improved function since initial evaluation. Pt also demonstrates consistency and independence with his HEP. Skilled physical therapy services discharged with patient continuing with his exercises at home. Challenges to progress include weakness along with other side effects from his cancer medication.    Personal Factors and Comorbidities Age;Comorbidity 3+;Time since onset of injury/illness/exacerbation  Comorbidities Bladder CA, CHF, pacemaker    Examination-Activity Limitations Bed Mobility    Stability/Clinical Decision Making --   Scapular pain worsened based on subjective reports   PT Treatment/Interventions Neuromuscular re-education;Therapeutic activities;Therapeutic exercise;Patient/family education;Manual techniques    PT Next Visit Plan Continue  progress with exercises at home    PT Home Exercise Plan Medbridge Access Code: [email or web address]    Consulted and Agree with Plan of Care Patient           Patient will benefit from skilled therapeutic intervention in order to improve the following deficits and impairments:  Pain, Postural dysfunction, Improper body mechanics, Decreased strength, Decreased range of motion  Visit Diagnosis: Cervicalgia  Chronic left shoulder pain  Muscle weakness (generalized)  Radiculopathy, cervical region     Problem List Patient Active Problem List   Diagnosis Date Noted   CVA (cerebral infarction) 08/21/2015   TIA (transient ischemic attack) 08/20/2015   Acute encephalopathy 08/20/2015    Thank you for your referral.  Joneen Boers PT, DPT   04/02/2020, 10:36 AM  [facility] [address] [place], [address] Phone: [phone]   Fax:  [phone]  Name: Dalton Obrien MRN: [id number] Date of Birth: 11-Nov-1940

## 2020-04-18 ENCOUNTER — Other Ambulatory Visit: Payer: Self-pay | Admitting: Cardiology

## 2020-04-18 ENCOUNTER — Ambulatory Visit
Admission: RE | Admit: 2020-04-18 | Discharge: 2020-04-18 | Disposition: A | Discharge status: Home / Self Care | Payer: Medicare HMO | Source: Ambulatory Visit | Attending: Cardiology | Admitting: Cardiology

## 2020-04-18 ENCOUNTER — Other Ambulatory Visit: Payer: Self-pay

## 2020-04-18 DIAGNOSIS — R4182 Altered mental status, unspecified: Secondary | ICD-10-CM

## 2020-04-19 ENCOUNTER — Ambulatory Visit: Payer: Medicare HMO

## 2020-05-21 ENCOUNTER — Ambulatory Visit: Payer: Medicare HMO | Admitting: Dermatology

## 2020-05-27 ENCOUNTER — Ambulatory Visit: Payer: Medicare HMO | Attending: Family Medicine

## 2020-05-27 ENCOUNTER — Other Ambulatory Visit: Payer: Self-pay

## 2020-05-27 DIAGNOSIS — R262 Difficulty in walking, not elsewhere classified: Secondary | ICD-10-CM | POA: Diagnosis present

## 2020-05-27 DIAGNOSIS — R2681 Unsteadiness on feet: Secondary | ICD-10-CM | POA: Diagnosis present

## 2020-05-27 DIAGNOSIS — M6281 Muscle weakness (generalized): Secondary | ICD-10-CM | POA: Diagnosis not present

## 2020-05-27 NOTE — Therapy (Signed)
[facility] [address] [place], [address] Phone: [phone]   Fax:  [phone]  Physical Therapy Evaluation  Patient Details  Name: Dalton Obrien MRN: [id number] Date of Birth: 27-Jul-1940 Referring Provider (PT): Allene Dillon, NP   Encounter Date: 05/27/2020   PT End of Session - 05/27/20 1650    Visit Number 1    Number of Visits 17    Date for PT Re-Evaluation 07/25/20    Authorization Type 1    Authorization Time Period of 10 progress report    PT Start Time 4196    PT Stop Time 1752    PT Time Calculation (min) 62 min    Equipment Utilized During Treatment Gait belt    Activity Tolerance Patient tolerated treatment well    Behavior During Therapy [facility] for tasks assessed/performed           Past Medical History:  Diagnosis Date  . Asthma   . Cancer ([facility])    Basal Cell on Face  . CHF (congestive heart failure) ([place])   . 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  There were no vitals filed for this visit.    Subjective Assessment - 05/27/20 1652    Subjective B lateral leg numbness (around L 5 dermatome and B plantar feet).    Pertinent History Lumbar DDD and decreased balance. 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 Wants to decrease neuropathy and rebuild his strength in his legs and improve balance. 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 Pt also lost weight, and is weak. Tries to walk about 1 hours on a regular basis. The Pad cev also dries up his skin and the skin on his R lateral foot cracked. Has a hard time with forward and backwards balance. Does not have back pain or sciatica. The neck, back and shoulder blade pain is essentially gone.  The pad cev decreases the insulation on the nerves. Seeing a neurologist and was also given vitamin B12.    Currently in  Pain? No/denies              [facility] PT Assessment - 05/27/20 1700      Assessment   Medical Diagnosis Lumbar DDD, imbalance    Referring Provider (PT) Allene Dillon, NP    Onset Date/Surgical Date 05/09/20   Date PT referral signed   Hand Dominance Right    Prior Therapy Minimal to no neck, scapular and low back pain after PT      Precautions   Precaution Comments Pacemaker      Restrictions   Other Position/Activity Restrictions Pacemaker related      Balance Screen   Has the patient fallen in the past 6 months No    Has the patient had a decrease in activity level because of a fear of falling?  Yes    Is the patient reluctant to leave their home because of a fear of falling?  No      Prior Function   Level of Independence Independent    Vocation Retired    Biomedical scientist PLOF: less difficulty performing standing tasks, walking    Leisure Lots of Golf      Observation/Other Assessments   Focus on Therapeutic Outcomes (FOTO)  Lumbar FOTO: 38      Posture/Postural Control  Posture Comments R thoracolumbar convexity      AROM   Lumbar Flexion Limited   seated trunk flexion, no change in B LE neuropathy   Lumbar Extension very limited     Lumbar - Right Side Bend Limited    Lumbar - Left Side Bend Limited    Lumbar - Right Rotation WFL    Lumbar - Left Rotation [facility]      Strength   Right Hip Flexion 4/5    Right Hip Extension 4+/5   seated manually resisted   Right Hip ABduction 4/5    Left Hip Flexion 4/5    Left Hip Extension 4/5   seated manually resisted   Left Hip ABduction 4-/5    Right Knee Flexion 4/5    Right Knee Extension 4+/5    Left Knee Flexion 4/5    Left Knee Extension 4/5      Special Tests   Other special tests Five times sit to stand:  14 seconds       Ambulation/Gait   Gait Comments Ambulates with SPC on R side for balance. Slight decreased L LE stance phase, R lateral lean during R LE stance phase.       Berg Balance Test    Sit to Stand Able to stand  independently using hands    Standing Unsupported Able to stand 2 minutes with supervision    Sitting with Back Unsupported but Feet Supported on Floor or Stool Able to sit safely and securely 2 minutes    Stand to Sit Controls descent by using hands    Transfers Able to transfer safely, definite need of hands    Standing Unsupported with Eyes Closed Able to stand 3 seconds    Standing Unsupported with Feet Together Needs help to attain position but able to stand for 30 seconds with feet together    From Standing, Reach Forward with Outstretched Arm Can reach forward >5 cm safely (2")    From Standing Position, Pick up Object from Floor Able to pick up shoe, needs supervision    From Standing Position, Turn to Look Behind Over each Shoulder Looks behind one side only/other side shows less weight shift    Turn 360 Degrees Needs close supervision or verbal cueing    Standing Unsupported, Alternately Place Feet on Step/Stool Needs assistance to keep from falling or unable to try    Standing Unsupported, One Foot in Front Able to take small step independently and hold 30 seconds    Standing on One Leg Unable to try or needs assist to prevent fall    Total Score 30    Berg comment: < 46 suggests increased risk for falls and need for AD                      Objective measurements completed on examination: See above findings.   Walking balance is gradually getting better with decreasing need for [facility]. Senses are gradually returning.   Pt states that the longer he stands still, the more wobbly he feels   Medbridge Access Code [email or web address]   PACEMAKER   Therapeutic exercise  S/L hip abduction with PT assist  L 10x2. Difficult  R 10x2  standing hip abduction with B UE assist   R 10x3  L 10x3  Reviewed and given as part of his HEP. Pt demonstrated and verbalized understanding. Handout provided.     Improved exercise technique, movement at target  joints, use of target  muscles after mod verbal, visual, tactile cues.   Response to treatment Pt tolerated session well without aggravation of symptoms   Clinical impression Pt is a 79 year old male who came to physical therapy secondary to neuropathy, weakness, and decreased balance. He also presents with altered gait pattern and posture, wide stance, bilateral hip weakness, unsteadiness, decreased B LE sensation distally along the L 5 dermatome and plantar nerves, and difficulty with tasks which involve forward and backward leaning as well as negotiating obstacles secondary to weakness and difficulty with balance. Pt will benefit from skilled physical therapy services to address the aforementioned deficits.      PT Education - 05/27/20 1817    Education Details ther-ex, HEP, plan of care    Person(s) Educated Patient    Methods Explanation;Demonstration;Tactile cues;Verbal cues;Handout    Comprehension Returned demonstration;Verbalized understanding            PT Short Term Goals - 05/27/20 1823      PT SHORT TERM GOAL #1   Title Pt will be independent with his initial HEP to improve LE strength, balance, and decrease fall risk.    Baseline Pt has started his HEP (05/27/2020)    Time 3    Period Weeks    Status New    Target Date 06/20/20             PT Long Term Goals - 05/27/20 1824      PT LONG TERM GOAL #1   Title Patient will improve bilateral LE strength to promote balance, ability to negotiate obstacles, ambulate with less difficulty.    Time 8    Period Weeks    Status New    Target Date 07/25/20      PT LONG TERM GOAL #2   Title Pt will improve his BERG balance score by at least 10 points as a demonstration of improved balance.    Baseline Berg balance score: 30 (05/27/2020)    Time 8    Period Weeks    Status New    Target Date 07/25/20      PT LONG TERM GOAL #3   Title Pt will improve his lumbar FOTO by at least 10 points as a demonstration of  improved function.    Baseline Lumbar FOTO: 38 (05/27/2020)    Time 8    Period Weeks    Status New    Target Date 07/25/20      PT LONG TERM GOAL #4   Title Pt will improve his 5 times sit to stand time to 10 seconds or less as a demonstration of improved functional strength    Baseline 14 seconds (05/27/2020)    Time 8    Period Weeks    Status New    Target Date 07/25/20                  Plan - 05/27/20 1818    Clinical Impression Statement Pt is a 79 year old male who came to physical therapy secondary to neuropathy, weakness, and decreased balance. He also presents with altered gait pattern and posture, wide stance, bilateral hip weakness, unsteadiness, decreased B LE sensation distally along the L 5 dermatome and plantar nerves, and difficulty with tasks which involve forward and backward leaning as well as negotiating obstacles secondary to weakness and difficulty with balance. Pt will benefit from skilled physical therapy services to address the aforementioned deficits.    Personal Factors and Comorbidities Age;Comorbidity 3+;Fitness;Past/Current Experience;Time since onset  of injury/illness/exacerbation    Comorbidities CA, side effects of chemo, CHF, neuropathy    Examination-Activity Limitations Lift;Stairs;Stand;Carry    Stability/Clinical Decision Making Stable/Uncomplicated    Clinical Decision Making Low    Rehab Potential Fair    PT Frequency 2x / week    PT Duration 8 weeks    PT Treatment/Interventions Therapeutic activities;Therapeutic exercise;Balance training;Neuromuscular re-education;Patient/family education;Manual techniques;Dry needling;Aquatic Therapy;Canalith Repostioning;Vestibular;Gait training;Stair training    PT Next Visit Plan glute and trunk strengthening, balance, neuromuscular re-ed, manual techniques, modalities PRN    PT Home Exercise Plan Medbridge Access Code [email or web address]    Consulted and Agree with Plan of Care Patient           Patient  will benefit from skilled therapeutic intervention in order to improve the following deficits and impairments:  Postural dysfunction, Improper body mechanics, Impaired sensation, Difficulty walking, Decreased strength, Decreased balance  Visit Diagnosis: Muscle weakness (generalized) - Plan: PT plan of care cert/re-cert  Unsteadiness on feet - Plan: PT plan of care cert/re-cert  Difficulty in walking, not elsewhere classified - Plan: PT plan of care cert/re-cert     Problem List Patient Active Problem List   Diagnosis Date Noted  . CVA (cerebral infarction) 08/21/2015  . TIA (transient ischemic attack) 08/20/2015  . Acute encephalopathy 08/20/2015    Joneen Boers PT, DPT   05/27/2020, 6:39 PM  [facility] [address] [place], [address] Phone: [phone]   Fax:  [phone]  Name: Dalton Obrien MRN: [id number] Date of Birth: 05-12-1941

## 2020-05-27 NOTE — Patient Instructions (Signed)
Access Code: [email or web address] URL: [email or web address] Date: 05/27/2020 Prepared by: Joneen Boers  Exercises Standing Hip Abduction with Counter Support - 1 x daily - 7 x weekly - 3 sets - 10 reps

## 2020-05-29 ENCOUNTER — Ambulatory Visit: Payer: Medicare HMO

## 2020-05-29 ENCOUNTER — Other Ambulatory Visit: Payer: Self-pay

## 2020-05-29 DIAGNOSIS — M6281 Muscle weakness (generalized): Secondary | ICD-10-CM | POA: Diagnosis not present

## 2020-05-29 DIAGNOSIS — R2681 Unsteadiness on feet: Secondary | ICD-10-CM

## 2020-05-29 DIAGNOSIS — R262 Difficulty in walking, not elsewhere classified: Secondary | ICD-10-CM

## 2020-05-29 NOTE — Therapy (Signed)
[facility] [address] [place], [address] Phone: [phone]   Fax:  [phone]  Physical Therapy Treatment  Patient Details  Name: Dalton Obrien MRN: [id number] Date of Birth: 03/31/1941 Referring Provider (PT): Allene Dillon, NP   Encounter Date: 05/29/2020   PT End of Session - 05/29/20 1057    Visit Number 2    Number of Visits 17    Date for PT Re-Evaluation 07/25/20    Authorization Type 2    Authorization Time Period of 10 progress report    PT Start Time 1057    PT Stop Time 1144    PT Time Calculation (min) 47 min    Equipment Utilized During Treatment Gait belt    Activity Tolerance Patient tolerated treatment well    Behavior During Therapy [facility] for tasks assessed/performed           Past Medical History:  Diagnosis Date  . Asthma   . Cancer (HCC)    Basal Cell on Face  . CHF (congestive heart failure) ([place])   . 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  There were no vitals filed for this visit.   Subjective Assessment - 05/29/20 1058    Subjective Trying to wean himself from his cane. Uses the [place] during curbs or stairs occasinoally. No pain currently. Did his exercises yesterday.    Pertinent History Lumbar DDD and decreased balance. 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 Wants to decrease neuropathy and rebuild his strength in his legs and improve balance. 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 Pt also lost weight, and is weak. Tries to walk about 1 hours on a regular basis. The Pad cev also dries up his skin and the skin on his R lateral foot cracked. Has a hard time with forward and backwards balance. Does not have back pain or sciatica. The neck, back and shoulder blade pain is essentially gone.  The pad cev decreases the insulation on the nerves.  Seeing a neurologist and was also given vitamin B12.    Currently in Pain? No/denies    Pain Score 0-No pain                                     PT Education - 05/29/20 1158    Education Details ther-ex, HEP    Person(s) Educated Patient    Methods Explanation;Demonstration;Tactile cues;Verbal cues;Handout    Comprehension Returned demonstration;Verbalized understanding           Objective measurements completed on examination: See above findings.   Walking balance is gradually getting better with decreasing need for [facility]. Senses are gradually returning.  Pt states that the longer he stands still, the more wobbly he feels  Medbridge Access Code [email or web address]   PACEMAKER  Standing posture: R lumbar convextiy  Therapeutic exercise  SLS with B UE assist   R 10x5 seconds   L 10x5 seconds    Slight L low back pain which decreases with rest  Static mini lunge with one UE assist   R 10x3  L 10x3  Forward step up onto 4 inch step with one UE assist   R 10x3  L 10x3  L low back discomfort when stepping down  Then without UE assist   R 10x  L 10x  Seated manually resisted L lateral shift isometrics to promote posture. 10x3 with 5 second holds.  Slight weakness felt with step up onto and over 4 inch step exercise which disappeared with repetition. No L low back discomfort afterwards with the aforementioned exercise.   Forward step up onto 1st regular step with B rails assist   R 10x  L 10x  Stepping over 6 mini hurdles, step over step, alternating feet 2x. Difficult. CGA to min A.   Then with pause after each hurdle, alternating feet, 4x. No LOB  CGA  Reviewed HEP. Pt demonstrated and verbalized understanding. Handout provided.   Try Standing tandem balance with one UE assist if appropriate      Improved exercise technique, movement at target joints, use of target muscles after mod verbal, visual, tactile cues.   Response to  treatment Pt tolerated session well without aggravation of symptoms   Clinical impression Worked on general LE strengthening, glute med and max strength to promote ability to negotiate steps and obstacles with less need of UE assist. Pt able to step up onto and over 4 inch step 10x without UE assist. Demonstrates fatigue toward the end. Some difficulty stepping over mini hurdles continuously but able to perform when pt takes a break in between hurdles to promote motor planning and improve ability to place and maintain his center of gravity over his base of support.  Pt tolerated session well without aggravation of symptoms. Pt will benefit from continued skilled physical therapy services to improve strength, balance, and function.       PT Short Term Goals - 05/27/20 1823      PT SHORT TERM GOAL #1   Title Pt will be independent with his initial HEP to improve LE strength, balance, and decrease fall risk.    Baseline Pt has started his HEP (05/27/2020)    Time 3    Period Weeks    Status New    Target Date 06/20/20             PT Long Term Goals - 05/27/20 1824      PT LONG TERM GOAL #1   Title Patient will improve bilateral LE strength to promote balance, ability to negotiate obstacles, ambulate with less difficulty.    Time 8    Period Weeks    Status New    Target Date 07/25/20      PT LONG TERM GOAL #2   Title Pt will improve his BERG balance score by at least 10 points as a demonstration of improved balance.    Baseline Berg balance score: 30 (05/27/2020)    Time 8    Period Weeks    Status New    Target Date 07/25/20      PT LONG TERM GOAL #3   Title Pt will improve his lumbar FOTO by at least 10 points as a demonstration of improved function.    Baseline Lumbar FOTO: 38 (05/27/2020)    Time 8    Period Weeks    Status New    Target Date 07/25/20      PT LONG TERM GOAL #4   Title Pt will improve his 5 times sit to stand time to 10 seconds or less as a  demonstration of improved functional strength    Baseline 14 seconds (05/27/2020)    Time 8    Period Weeks    Status New  Target Date 07/25/20                 Plan - 05/29/20 1156    Clinical Impression Statement Worked on general LE strengthening, glute med and max strength to promote ability to negotiate steps and obstacles with less need of UE assist. Pt able to step up onto and over 4 inch step 10x without UE assist. Demonstrates fatigue toward the end. Some difficulty stepping over mini hurdles continuously but able to perform when pt takes a break in between hurdles to promote motor planning and improve ability to place and maintain his center of gravity over his base of support.  Pt tolerated session well without aggravation of symptoms. Pt will benefit from continued skilled physical therapy services to improve strength, balance, and function.    Personal Factors and Comorbidities Age;Comorbidity 3+;Fitness;Past/Current Experience;Time since onset of injury/illness/exacerbation    Comorbidities CA, side effects of chemo, CHF, neuropathy    Examination-Activity Limitations Lift;Stairs;Stand;Carry    Stability/Clinical Decision Making Stable/Uncomplicated    Rehab Potential Fair    PT Frequency 2x / week    PT Duration 8 weeks    PT Treatment/Interventions Therapeutic activities;Therapeutic exercise;Balance training;Neuromuscular re-education;Patient/family education;Manual techniques;Dry needling;Aquatic Therapy;Canalith Repostioning;Vestibular;Gait training;Stair training    PT Next Visit Plan glute and trunk strengthening, balance, neuromuscular re-ed, manual techniques, modalities PRN    PT Home Exercise Plan Medbridge Access Code [email or web address]    Consulted and Agree with Plan of Care Patient           Patient will benefit from skilled therapeutic intervention in order to improve the following deficits and impairments:  Postural dysfunction, Improper body mechanics, Impaired  sensation, Difficulty walking, Decreased strength, Decreased balance  Visit Diagnosis: Muscle weakness (generalized)  Difficulty in walking, not elsewhere classified  Unsteadiness on feet     Problem List Patient Active Problem List   Diagnosis Date Noted  . CVA (cerebral infarction) 08/21/2015  . TIA (transient ischemic attack) 08/20/2015  . Acute encephalopathy 08/20/2015    Joneen Boers PT, DPT   05/29/2020, 11:59 AM  [place] [facility] [address] [place], [address] Phone: [phone]   Fax:  [phone]  Name: ANIKEN MONESTIME MRN: [id number] Date of Birth: 1941-02-22

## 2020-06-03 ENCOUNTER — Other Ambulatory Visit: Payer: Self-pay

## 2020-06-03 ENCOUNTER — Ambulatory Visit: Payer: Medicare HMO

## 2020-06-03 DIAGNOSIS — R2681 Unsteadiness on feet: Secondary | ICD-10-CM

## 2020-06-03 DIAGNOSIS — M6281 Muscle weakness (generalized): Secondary | ICD-10-CM | POA: Diagnosis not present

## 2020-06-03 DIAGNOSIS — R262 Difficulty in walking, not elsewhere classified: Secondary | ICD-10-CM

## 2020-06-03 NOTE — Patient Instructions (Signed)
Access Code: [email or web address] URL: [email or web address] Date: 06/03/2020 Prepared by: Joneen Boers  Exercises Standing Hip Abduction with Counter Support - 1 x daily - 7 x weekly - 3 sets - 10 reps Forward Step Up with Counter Support - 1 x daily - 7 x weekly - 2 sets - 10 reps Partial Lunge with Chair - 1 x daily - 7 x weekly - 3 sets - 10 reps Side Stepping with Counter Support - 2 x daily - 7 x weekly - 1 sets - 2 reps

## 2020-06-03 NOTE — Therapy (Signed)
[place] [facility] [address], [place], [address] Phone: [phone]   Fax:  [phone]  Physical Therapy Treatment  Patient Details  Name: Dalton Obrien MRN: [id number] Date of Birth: February 03, 1941 Referring Provider (PT): Allene Dillon, NP   Encounter Date: 06/03/2020   PT End of Session - 06/03/20 1114    Visit Number 3    Number of Visits 17    Date for PT Re-Evaluation 07/25/20    Authorization Type 3    Authorization Time Period of 10 progress report    PT Start Time 1114    PT Stop Time 1200    PT Time Calculation (min) 46 min    Equipment Utilized During Treatment Gait belt    Activity Tolerance Patient tolerated treatment well    Behavior During Therapy [facility] for tasks assessed/performed           Past Medical History:  Diagnosis Date  . Asthma   . Cancer (HCC)    Basal Cell on Face  . CHF (congestive heart failure) ([place])   . 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  There were no vitals filed for this visit.   Subjective Assessment - 06/03/20 1115    Subjective The standing hip abduction exercise made his R glute sore for about 3 days. The static lunges made his R calf sore for a couple of days. Pt also played golf 2 hours for 3 days. Able to play golf longer.    Pertinent History Lumbar DDD and decreased balance. 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 Wants to decrease neuropathy and rebuild his strength in his legs and improve balance. 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 Pt also lost weight, and is weak. Tries to walk about 1 hours on a regular basis. The Pad cev also dries up his skin and the skin on his R lateral foot cracked. Has a hard time with forward and backwards balance. Does not have back pain or sciatica. The neck, back and shoulder blade pain is  essentially gone.  The pad cev decreases the insulation on the nerves. Seeing a neurologist and was also given vitamin B12.    Currently in Pain? No/denies                                     PT Education - 06/03/20 1144    Education Details ther-ex    Person(s) Educated Patient    Methods Explanation;Demonstration;Tactile cues;Verbal cues    Comprehension Returned demonstration;Verbalized understanding          Objective   Walking balance is gradually getting better with decreasing need for SPC. Senses are gradually returning.  Pt states that the longer he stands still, the more wobbly he feels   MedbridgeAccess Code [email or web address]  PACEMAKER  Standing posture: R lumbar convextiy  Therapeutic exercise  SLS with B UE assist to promote glute med strength and balance.              R 10x5 seconds for 2 sets             L 10x5 seconds for 2 sets  Standing tandem balance with one UE assist finger touch assist L   R foot in front 30  seconds x 3  L foot in front 30 seconds x 3    To promote balance with decreased base of support.   Forward step up onto 4 inch step with one UE assist              R 10x3             L 10x3   Stepping over 6 mini hurdles with pause after each hurdle, alternating feet, 4x2. No LOB for the most part. Min A for 1 mini hurdle             CGA  Able to perform more quickly compared to previous session.    Side stepping 32 ft to the R, and 32 ft to the L for 2 sets     Good glute med muscle work felt.   Forward wedding march 32 ft x 4    Improved exercise technique, movement at target joints, use of target muscles after mod verbal, visual, tactile cues.   Response to treatment Pt tolerated session well without aggravation of symptoms   Clinical impression Improved ability to negotiate obstacles compared to previous session. Continued working on glute strengthening to promote single leg balance for LE  stance phase of gait as well as stance phase for when stepping over obstacles. Pt tolerated session well without aggravation of symptoms. Pt will benefit from continued skilled physical therapy services to improve strength, balance, function and ability to ambulate with less difficulty.        PT Short Term Goals - 05/27/20 1823      PT SHORT TERM GOAL #1   Title Pt will be independent with his initial HEP to improve LE strength, balance, and decrease fall risk.    Baseline Pt has started his HEP (05/27/2020)    Time 3    Period Weeks    Status New    Target Date 06/20/20             PT Long Term Goals - 05/27/20 1824      PT LONG TERM GOAL #1   Title Patient will improve bilateral LE strength to promote balance, ability to negotiate obstacles, ambulate with less difficulty.    Time 8    Period Weeks    Status New    Target Date 07/25/20      PT LONG TERM GOAL #2   Title Pt will improve his BERG balance score by at least 10 points as a demonstration of improved balance.    Baseline Berg balance score: 30 (05/27/2020)    Time 8    Period Weeks    Status New    Target Date 07/25/20      PT LONG TERM GOAL #3   Title Pt will improve his lumbar FOTO by at least 10 points as a demonstration of improved function.    Baseline Lumbar FOTO: 38 (05/27/2020)    Time 8    Period Weeks    Status New    Target Date 07/25/20      PT LONG TERM GOAL #4   Title Pt will improve his 5 times sit to stand time to 10 seconds or less as a demonstration of improved functional strength    Baseline 14 seconds (05/27/2020)    Time 8    Period Weeks    Status New    Target Date 07/25/20                 Plan -  06/03/20 1112    Clinical Impression Statement Improved ability to negotiate obstacles compared to previous session. Continued working on glute strengthening to promote single leg balance for LE stance phase of gait as well as stance phase for when stepping over obstacles. Pt  tolerated session well without aggravation of symptoms. Pt will benefit from continued skilled physical therapy services to improve strength, balance, function and ability to ambulate with less difficulty.    Personal Factors and Comorbidities Age;Comorbidity 3+;Fitness;Past/Current Experience;Time since onset of injury/illness/exacerbation    Comorbidities CA, side effects of chemo, CHF, neuropathy    Examination-Activity Limitations Lift;Stairs;Stand;Carry    Stability/Clinical Decision Making Stable/Uncomplicated    Rehab Potential Fair    PT Frequency 2x / week    PT Duration 8 weeks    PT Treatment/Interventions Therapeutic activities;Therapeutic exercise;Balance training;Neuromuscular re-education;Patient/family education;Manual techniques;Dry needling;Aquatic Therapy;Canalith Repostioning;Vestibular;Gait training;Stair training    PT Next Visit Plan glute and trunk strengthening, balance, neuromuscular re-ed, manual techniques, modalities PRN    PT Home Exercise Plan Medbridge Access Code [email or web address]    Consulted and Agree with Plan of Care Patient           Patient will benefit from skilled therapeutic intervention in order to improve the following deficits and impairments:  Postural dysfunction, Improper body mechanics, Impaired sensation, Difficulty walking, Decreased strength, Decreased balance  Visit Diagnosis: Muscle weakness (generalized)  Difficulty in walking, not elsewhere classified  Unsteadiness on feet     Problem List Patient Active Problem List   Diagnosis Date Noted  . CVA (cerebral infarction) 08/21/2015  . TIA (transient ischemic attack) 08/20/2015  . Acute encephalopathy 08/20/2015    Joneen Boers PT, DPT   06/03/2020, 1:24 PM  [place] [facility] [address], [place], [address] Phone: [phone]   Fax:  [phone]  Name: Dalton Obrien MRN: [id number] Date of Birth:  October 04, 1940

## 2020-06-11 ENCOUNTER — Ambulatory Visit: Payer: Medicare HMO | Attending: Family Medicine

## 2020-06-11 ENCOUNTER — Other Ambulatory Visit: Payer: Self-pay

## 2020-06-11 DIAGNOSIS — R2681 Unsteadiness on feet: Secondary | ICD-10-CM | POA: Diagnosis present

## 2020-06-11 DIAGNOSIS — R262 Difficulty in walking, not elsewhere classified: Secondary | ICD-10-CM | POA: Insufficient documentation

## 2020-06-11 DIAGNOSIS — M6281 Muscle weakness (generalized): Secondary | ICD-10-CM | POA: Diagnosis not present

## 2020-06-11 NOTE — Therapy (Signed)
[facility] [facility] [address], [place], [address] Phone: [phone]   Fax:  [phone]  Physical Therapy Treatment  Patient Details  Name: Dalton Obrien MRN: [id number] Date of Birth: 07-Dec-1940 Referring Provider (PT): Allene Dillon, NP   Encounter Date: 06/11/2020   PT End of Session - 06/11/20 1302    Visit Number 4    Number of Visits 17    Date for PT Re-Evaluation 07/25/20    Authorization Type 4    Authorization Time Period of 10 progress report    PT Start Time 1302    PT Stop Time 1343    PT Time Calculation (min) 41 min    Equipment Utilized During Treatment Gait belt    Activity Tolerance Patient tolerated treatment well    Behavior During Therapy [facility] for tasks assessed/performed           Past Medical History:  Diagnosis Date  . Asthma   . Cancer (HCC)    Basal Cell on Face  . CHF (congestive heart failure) ([place])   . 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  There were no vitals filed for this visit.   Subjective Assessment - 06/11/20 1303    Subjective Doing great. Played golf for about 1.5 hours yesterday. Did his exercises as well. Has to be careful of his L shoulder blade. Not using the [facility] anymore.    Pertinent History Lumbar DDD and decreased balance. 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 Wants to decrease neuropathy and rebuild his strength in his legs and improve balance. 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 Pt also lost weight, and is weak. Tries to walk about 1 hours on a regular basis. The Pad cev also dries up his skin and the skin on his R lateral foot cracked. Has a hard time with forward and backwards balance. Does not have back pain or sciatica. The neck, back and shoulder blade pain is essentially gone.  The pad cev decreases the insulation on  the nerves. Seeing a neurologist and was also given vitamin B12.    Currently in Pain? No/denies                                     PT Education - 06/11/20 1304    Education Details ther-ex, HEP    Person(s) Educated Patient    Methods Explanation;Demonstration;Tactile cues;Verbal cues    Comprehension Returned demonstration;Verbalized understanding           Objective   Walking balance is gradually getting better with decreasing need for SPC. Senses are gradually returning.  Pt states that the longer he stands still, the more wobbly he feels     MedbridgeAccess Code [email or web address]  PACEMAKER  Standing posture: R lumbar convextiy  Therapeutic exercise  SLS with B UE assist to promote glute med strength and balance.  R 10x5 seconds for 2 sets L 10x5 seconds for 2 sets  Give as part of HEP next visit if appropriate   Tandem to semi tandem walk 10 ft forward x 3 CGA to min A Tandem to semi tandem walk 10 ft backward x 3 CGA to min A  Standing toe taps onto cones front, lateral, and back with one  UE assist  R 5x2  L 5x 2  To promote single leg balance and coordination.   Static mini lunge with one UE assist   R 10x3  L 10x3  Lateral step downs on first regular step with B UE assist to promote ability to negotiate the 16 steps at his daughter's house.   L 10x  R 10x   Pt can perform at home with B UE assist if he feels safe with it. Pt verbalized understanding.   Stand to sit onto regular chair with arms, no UE assist (can use hands to stand) 5x2     Improved exercise technique, movement at target joints, use of target muscles after mod verbal, visual, tactile cues.   Response to treatment Pt tolerated session well without aggravation of symptoms   Clinical impression Improving overall function and ability to ambulate without need for AD based on subjective reports. Pt arrived to clinic without SPC  and able to ambulate independently. Continued working on improving glute med and overall LE strength to promote ability to perform standing tasks with less difficulty as well as improve balance. Pt tolerated session well without aggravation of symptoms. Pt will benefit from continued skilled physical therapy services to improve strength, balance, and function.        PT Short Term Goals - 05/27/20 1823      PT SHORT TERM GOAL #1   Title Pt will be independent with his initial HEP to improve LE strength, balance, and decrease fall risk.    Baseline Pt has started his HEP (05/27/2020)    Time 3    Period Weeks    Status New    Target Date 06/20/20             PT Long Term Goals - 05/27/20 1824      PT LONG TERM GOAL #1   Title Patient will improve bilateral LE strength to promote balance, ability to negotiate obstacles, ambulate with less difficulty.    Time 8    Period Weeks    Status New    Target Date 07/25/20      PT LONG TERM GOAL #2   Title Pt will improve his BERG balance score by at least 10 points as a demonstration of improved balance.    Baseline Berg balance score: 30 (05/27/2020)    Time 8    Period Weeks    Status New    Target Date 07/25/20      PT LONG TERM GOAL #3   Title Pt will improve his lumbar FOTO by at least 10 points as a demonstration of improved function.    Baseline Lumbar FOTO: 38 (05/27/2020)    Time 8    Period Weeks    Status New    Target Date 07/25/20      PT LONG TERM GOAL #4   Title Pt will improve his 5 times sit to stand time to 10 seconds or less as a demonstration of improved functional strength    Baseline 14 seconds (05/27/2020)    Time 8    Period Weeks    Status New    Target Date 07/25/20                 Plan - 06/11/20 1305    Clinical Impression Statement Improving overall function and ability to ambulate without need for AD based on subjective reports. Pt arrived to clinic without SPC and able to ambulate  independently. Continued working on  improving glute med and overall LE strength to promote ability to perform standing tasks with less difficulty as well as improve balance. Pt tolerated session well without aggravation of symptoms. Pt will benefit from continued skilled physical therapy services to improve strength, balance, and function.    Personal Factors and Comorbidities Age;Comorbidity 3+;Fitness;Past/Current Experience;Time since onset of injury/illness/exacerbation    Comorbidities CA, side effects of chemo, CHF, neuropathy    Examination-Activity Limitations Lift;Stairs;Stand;Carry    Stability/Clinical Decision Making Stable/Uncomplicated    Rehab Potential Fair    PT Frequency 2x / week    PT Duration 8 weeks    PT Treatment/Interventions Therapeutic activities;Therapeutic exercise;Balance training;Neuromuscular re-education;Patient/family education;Manual techniques;Dry needling;Aquatic Therapy;Canalith Repostioning;Vestibular;Gait training;Stair training    PT Next Visit Plan glute and trunk strengthening, balance, neuromuscular re-ed, manual techniques, modalities PRN    PT Home Exercise Plan Medbridge Access Code [email or web address]    Consulted and Agree with Plan of Care Patient           Patient will benefit from skilled therapeutic intervention in order to improve the following deficits and impairments:  Postural dysfunction, Improper body mechanics, Impaired sensation, Difficulty walking, Decreased strength, Decreased balance  Visit Diagnosis: Muscle weakness (generalized)  Difficulty in walking, not elsewhere classified  Unsteadiness on feet     Problem List Patient Active Problem List   Diagnosis Date Noted  . CVA (cerebral infarction) 08/21/2015  . TIA (transient ischemic attack) 08/20/2015  . Acute encephalopathy 08/20/2015    Joneen Boers PT, DPT   06/11/2020, 8:14 PM   [place] [address], [place], [address] Phone: [phone]   Fax:  [phone]  Name: Dalton Obrien MRN: [id number] Date of Birth: 1940-10-18

## 2020-06-11 NOTE — Patient Instructions (Signed)
Lateral step downs on first regular step with B UE assist to promote ability to negotiate the 16 steps at his daughter's house.   L 10x  R 10x   Pt can perform at home with B UE assist if he feels safe with it. Pt verbalized understanding.

## 2020-06-12 ENCOUNTER — Encounter: Payer: Self-pay | Admitting: Dermatology

## 2020-06-12 ENCOUNTER — Other Ambulatory Visit: Payer: Self-pay

## 2020-06-12 ENCOUNTER — Ambulatory Visit: Payer: Medicare HMO

## 2020-06-12 ENCOUNTER — Ambulatory Visit (INDEPENDENT_AMBULATORY_CARE_PROVIDER_SITE_OTHER): Payer: Medicare HMO | Admitting: Dermatology

## 2020-06-12 DIAGNOSIS — D692 Other nonthrombocytopenic purpura: Secondary | ICD-10-CM

## 2020-06-12 DIAGNOSIS — L578 Other skin changes due to chronic exposure to nonionizing radiation: Secondary | ICD-10-CM | POA: Diagnosis not present

## 2020-06-12 DIAGNOSIS — L570 Actinic keratosis: Secondary | ICD-10-CM | POA: Diagnosis not present

## 2020-06-12 DIAGNOSIS — L853 Xerosis cutis: Secondary | ICD-10-CM

## 2020-06-12 DIAGNOSIS — Z85828 Personal history of other malignant neoplasm of skin: Secondary | ICD-10-CM | POA: Diagnosis not present

## 2020-06-12 DIAGNOSIS — L820 Inflamed seborrheic keratosis: Secondary | ICD-10-CM

## 2020-06-12 DIAGNOSIS — Z8551 Personal history of malignant neoplasm of bladder: Secondary | ICD-10-CM

## 2020-06-12 NOTE — Progress Notes (Signed)
New Patient Visit  Subjective  Dalton Obrien is a 79 y.o. male who presents for the following: growths (face, scalp, hx of BCC).  Patient accompanied by wife who contributes to history.  The following portions of the chart were reviewed this encounter and updated as appropriate:   Allergies  Meds  Problems  Med Hx  Surg Hx  Fam Hx     Review of Systems:  No other skin or systemic complaints except as noted in HPI or Assessment and Plan.  Objective  Well appearing patient in no apparent distress; mood and affect are within normal limits.  A focused examination was performed including scalp, face, arms. Relevant physical exam findings are noted in the Assessment and Plan.  Objective  Left lateral lower eyelid/lateral canthus x 1, R ear x 1, scalp x 10 (11): Pink scaly macules   Objective  forehead x 4 (4): Erythematous keratotic or waxy stuck-on papule or plaque.    Assessment & Plan    History of Basal Cell Carcinoma of the Skin - No evidence of recurrence today - Recommend regular full body skin exams - Recommend daily broad spectrum sunscreen SPF 30+ to sun-exposed areas, reapply every 2 hours as needed.  - Call if any new or changing lesions are noted between office visits - L outer zygomatic arch  AK (actinic keratosis) (12) R ear x 1, scalp x 10 (11); Left lateral lower eyelid/lateral canthus x 1  Destruction of lesion - Left lateral lower eyelid/lateral canthus x 1, R ear x 1, scalp x 10 Complexity: simple   Destruction method: cryotherapy   Informed consent: discussed and consent obtained   Timeout:  patient name, date of birth, surgical site, and procedure verified Lesion destroyed using liquid nitrogen: Yes   Region frozen until ice ball extended beyond lesion: Yes   Outcome: patient tolerated procedure well with no complications   Post-procedure details: wound care instructions given    Inflamed seborrheic keratosis (4) forehead x  4  Destruction of lesion - forehead x 4 Complexity: simple   Destruction method: cryotherapy   Informed consent: discussed and consent obtained   Timeout:  patient name, date of birth, surgical site, and procedure verified Lesion destroyed using liquid nitrogen: Yes   Region frozen until ice ball extended beyond lesion: Yes   Outcome: patient tolerated procedure well with no complications   Post-procedure details: wound care instructions given     Actinic Damage - chronic, secondary to cumulative UV radiation exposure/sun exposure over time - diffuse scaly erythematous macules with underlying dyspigmentation - Recommend daily broad spectrum sunscreen SPF 30+ to sun-exposed areas, reapply every 2 hours as needed.  - Call for new or changing lesions.  Purpura - Chronic; persistent and recurrent.  Treatable, but not curable. - Violaceous macules and patches - Benign - Related to age, sun damage and/or use of blood thinners - Observe - Can use OTC arnica containing moisturizer such as Dermend Bruise Formula if desired - Call for worsening or other concerns  Xerosis - diffuse xerotic patches - recommend gentle, hydrating skin care - gentle skin care handout given -Recommend Cerave cream or Amlactin Rapid Relief  Hx of Bladder Cancer - pt being followed at [facility]  Return in about 3 months (around 09/10/2020) for Hx of AKs.  I, Othelia Pulling, RMA, am acting as scribe for Sarina Ser, MD .  Documentation: I have reviewed the above documentation for accuracy and completeness, and I agree with the above.  Sarina Ser, MD

## 2020-06-18 ENCOUNTER — Encounter: Payer: Self-pay | Admitting: Dermatology

## 2020-06-19 ENCOUNTER — Ambulatory Visit: Payer: Medicare HMO

## 2020-06-19 ENCOUNTER — Other Ambulatory Visit: Payer: Self-pay

## 2020-06-19 DIAGNOSIS — M6281 Muscle weakness (generalized): Secondary | ICD-10-CM | POA: Diagnosis not present

## 2020-06-19 DIAGNOSIS — R2681 Unsteadiness on feet: Secondary | ICD-10-CM

## 2020-06-19 DIAGNOSIS — R262 Difficulty in walking, not elsewhere classified: Secondary | ICD-10-CM

## 2020-06-19 NOTE — Therapy (Signed)
[facility] [address], [place], [address] Phone: [phone]   Fax:  [phone]  Physical Therapy Treatment  Patient Details  Name: Dalton Obrien MRN: [id number] Date of Birth: 1941/03/17 Referring Provider (PT): Allene Dillon, NP   Encounter Date: 06/19/2020   PT End of Session - 06/19/20 0853    Visit Number 5    Number of Visits 17    Date for PT Re-Evaluation 07/25/20    Authorization Type 5    Authorization Time Period of 10 progress report    PT Start Time 0854    PT Stop Time 0933    PT Time Calculation (min) 39 min    Equipment Utilized During Treatment Gait belt    Activity Tolerance Patient tolerated treatment well    Behavior During Therapy [facility] for tasks assessed/performed           Past Medical History:  Diagnosis Date  . Asthma   . Cancer (HCC)    Basal Cell on Face  . CHF (congestive heart failure) ([facility])   . 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  There were no vitals filed for this visit.   Subjective Assessment - 06/19/20 0855    Subjective Occasional L shoulder blade discomfort. Played 9 holes of golf Saturday, Sunday, Monday, Tuesday. Tired after golf to do his exercises. Does not have to use the cane so he has made some progress.    Pertinent History Lumbar DDD and decreased balance. 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 Wants to decrease neuropathy and rebuild his strength in his legs and improve balance. 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 Pt also lost weight, and is weak. Tries to walk about 1 hours on a regular basis. The Pad cev also dries up his skin and the skin on his R lateral foot cracked. Has a hard time with forward and backwards balance. Does not have back pain or sciatica. The neck, back and shoulder blade pain is essentially  gone.  The pad cev decreases the insulation on the nerves. Seeing a neurologist and was also given vitamin B12.    Currently in Pain? No/denies                                     PT Education - 06/19/20 0905    Education Details ther-ex. HEP    Person(s) Educated Patient    Methods Explanation;Demonstration;Tactile cues;Verbal cues;Handout    Comprehension Returned demonstration;Verbalized understanding          Objective   Walking balance is gradually getting better with decreasing need for SPC. Senses are gradually returning.  Pt states that the longer he stands still, the more wobbly he feels     MedbridgeAccess Code [email or web address]  PACEMAKER  Standing posture: R lumbar convextiy  Therapeutic exercise  SLS with B UE assistto promote glute med strength and balance. R 10x5 secondsfor 3 sets L 10x5 secondsfor 3 sets                     Tandem to semi tandem walk 10 ft forward x 4 CGA to min A Tandem to semi tandem walk 10 ft backward x 4 CGA to min A   Lateral  step downs on first regular step with B UE assist to promote ability to negotiate the 16 steps at his daughter's house.              L 10x             R 10x   Standing toe taps onto cones front, lateral, and back with one UE assist             R 5x2             L 5x 2             To promote single leg balance and coordination.   Static mini lunge with one UE assist              R 10x3             L 10x3      Stand to sit onto regular chair with arms, no UE assist (can use hands to stand) 5x3     Improved exercise technique, movement at target joints, use of target muscles after mod verbal, visual, tactile cues.   Response to treatment Pt tolerated session well without aggravation of symptoms   Clinical impression Pt demonstrates overall improvement in LE function based on subject reports of being able to play golf and no longer  utilizing his SPC. Pt seems to be returning to his PLOF steadily. Still demonstrates fatigue but overall seems to be physically improving. Pt tolerated session well without aggravation of symptoms. Pt will benefit form continued skilled physical therapy services to improve strength, balance, and function.        PT Short Term Goals - 05/27/20 1823      PT SHORT TERM GOAL #1   Title Pt will be independent with his initial HEP to improve LE strength, balance, and decrease fall risk.    Baseline Pt has started his HEP (05/27/2020)    Time 3    Period Weeks    Status New    Target Date 06/20/20             PT Long Term Goals - 05/27/20 1824      PT LONG TERM GOAL #1   Title Patient will improve bilateral LE strength to promote balance, ability to negotiate obstacles, ambulate with less difficulty.    Time 8    Period Weeks    Status New    Target Date 07/25/20      PT LONG TERM GOAL #2   Title Pt will improve his BERG balance score by at least 10 points as a demonstration of improved balance.    Baseline Berg balance score: 30 (05/27/2020)    Time 8    Period Weeks    Status New    Target Date 07/25/20      PT LONG TERM GOAL #3   Title Pt will improve his lumbar FOTO by at least 10 points as a demonstration of improved function.    Baseline Lumbar FOTO: 38 (05/27/2020)    Time 8    Period Weeks    Status New    Target Date 07/25/20      PT LONG TERM GOAL #4   Title Pt will improve his 5 times sit to stand time to 10 seconds or less as a demonstration of improved functional strength    Baseline 14 seconds (05/27/2020)    Time 8    Period Weeks    Status New  Target Date 07/25/20                 Plan - 06/19/20 3491    Clinical Impression Statement Pt demonstrates overall improvement in LE function based on subject reports of being able to play golf and no longer utilizing his SPC. Pt seems to be returning to his PLOF steadily. Still demonstrates fatigue  but overall seems to be physically improving. Pt tolerated session well without aggravation of symptoms. Pt will benefit form continued skilled physical therapy services to improve strength, balance, and function.    Personal Factors and Comorbidities Age;Comorbidity 3+;Fitness;Past/Current Experience;Time since onset of injury/illness/exacerbation    Comorbidities CA, side effects of chemo, CHF, neuropathy    Examination-Activity Limitations Lift;Stairs;Stand;Carry    Stability/Clinical Decision Making Stable/Uncomplicated    Rehab Potential Fair    PT Frequency 2x / week    PT Duration 8 weeks    PT Treatment/Interventions Therapeutic activities;Therapeutic exercise;Balance training;Neuromuscular re-education;Patient/family education;Manual techniques;Dry needling;Aquatic Therapy;Canalith Repostioning;Vestibular;Gait training;Stair training    PT Next Visit Plan glute and trunk strengthening, balance, neuromuscular re-ed, manual techniques, modalities PRN    PT Home Exercise Plan Medbridge Access Code [email or web address]    Consulted and Agree with Plan of Care Patient           Patient will benefit from skilled therapeutic intervention in order to improve the following deficits and impairments:  Postural dysfunction,Improper body mechanics,Impaired sensation,Difficulty walking,Decreased strength,Decreased balance  Visit Diagnosis: Muscle weakness (generalized)  Difficulty in walking, not elsewhere classified  Unsteadiness on feet     Problem List Patient Active Problem List   Diagnosis Date Noted  . CVA (cerebral infarction) 08/21/2015  . TIA (transient ischemic attack) 08/20/2015  . Acute encephalopathy 08/20/2015    Joneen Boers PT, DPT   06/19/2020, 10:30 AM  [facility] [address], [place], [address] Phone: [phone]   Fax:  [phone]  Name: Dalton Obrien MRN: [id number] Date of Birth:  [birth year]

## 2020-06-25 ENCOUNTER — Ambulatory Visit: Payer: Medicare HMO

## 2020-07-09 ENCOUNTER — Ambulatory Visit: Payer: Medicare HMO

## 2020-08-06 ENCOUNTER — Other Ambulatory Visit: Payer: Self-pay

## 2020-08-06 ENCOUNTER — Other Ambulatory Visit: Payer: Self-pay | Admitting: Dermatology

## 2020-08-06 ENCOUNTER — Ambulatory Visit (INDEPENDENT_AMBULATORY_CARE_PROVIDER_SITE_OTHER): Payer: Medicare HMO | Admitting: Dermatology

## 2020-08-06 DIAGNOSIS — L578 Other skin changes due to chronic exposure to nonionizing radiation: Secondary | ICD-10-CM

## 2020-08-06 DIAGNOSIS — D485 Neoplasm of uncertain behavior of skin: Secondary | ICD-10-CM | POA: Diagnosis not present

## 2020-08-06 NOTE — Progress Notes (Signed)
   Follow-Up Visit   Subjective  Dalton Obrien is a 80 y.o. male who presents for the following: Follow-up (Patient is here today to have a spot rechecked on his right ear. He had an AK treated on his ear on 06/12/20. He says this area still has a scab on it that will come and go. His ear is also sore and he sleeps on his right side.).   The following portions of the chart were reviewed this encounter and updated as appropriate:       Review of Systems:  No other skin or systemic complaints except as noted in HPI or Assessment and Plan.  Objective  Well appearing patient in no apparent distress; mood and affect are within normal limits.  A focused examination was performed including face, right ear. Relevant physical exam findings are noted in the Assessment and Plan.  Objective  R mid ear helix: 6.42mm firm flesh papule with central erosion.      Assessment & Plan   Actinic Damage - chronic, secondary to cumulative UV radiation exposure/sun exposure over time - diffuse scaly erythematous macules with underlying dyspigmentation - Recommend daily broad spectrum sunscreen SPF 30+ to sun-exposed areas, reapply every 2 hours as needed.  - Call for new or changing lesions.  Neoplasm of uncertain behavior of skin R mid ear helix  Epidermal / dermal shaving  Lesion diameter (cm):  0.6 Informed consent: discussed and consent obtained   Patient was prepped and draped in usual sterile fashion: Area prepped with alcohol. Anesthesia: the lesion was anesthetized in a standard fashion   Anesthetic:  1% lidocaine w/ epinephrine 1-100,000 buffered w/ 8.4% NaHCO3 Instrument used: flexible razor blade   Hemostasis achieved with: pressure, aluminum chloride and electrodesiccation   Outcome: patient tolerated procedure well   Post-procedure details: wound care instructions given   Post-procedure details comment:  Ointment and small bandage applied  Specimen 1 - Surgical  pathology Differential Diagnosis: Hypertrophic AK vs CDNH r/o BCC Check Margins: No 6.6mm firm flesh papule with central erosion  CDNH r/o BCC  Discussed risk of recurrence. Recommend patient sleep with donut pillow to relieve pressure on right ear.  Return as scheduled with Dr Dara Lords, Jamesetta Orleans, CMA, am acting as scribe for Brendolyn Patty, MD .  Documentation: I have reviewed the above documentation for accuracy and completeness, and I agree with the above.  Brendolyn Patty MD

## 2020-08-06 NOTE — Patient Instructions (Addendum)
Wound Care Instructions  1. Cleanse wound gently with soap and water once a day then pat dry with clean gauze. Apply a thing coat of Petrolatum (petroleum jelly, "Vaseline") over the wound (unless you have an allergy to this). We recommend that you use a new, sterile tube of Vaseline. Do not pick or remove scabs. Do not remove the yellow or white "healing tissue" from the base of the wound.  2. Cover the wound with fresh, clean, nonstick gauze and secure with paper tape. You may use Band-Aids in place of gauze and tape if the would is small enough, but would recommend trimming much of the tape off as there is often too much. Sometimes Band-Aids can irritate the skin.  3. You should call the office for your biopsy report after 1 week if you have not already been contacted.  4. If you experience any problems, such as abnormal amounts of bleeding, swelling, significant bruising, significant pain, or evidence of infection, please call the office immediately.   Recommend sleeping with a travel pillow/donut pillow to relieve pressure on right ear.

## 2020-08-08 ENCOUNTER — Telehealth: Payer: Self-pay

## 2020-08-08 NOTE — Telephone Encounter (Signed)
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  Try to avoid pressure on that ear when sleeping.

## 2020-08-08 NOTE — Telephone Encounter (Signed)
Advised pts wife of bx results/sh ?

## 2020-08-12 ENCOUNTER — Other Ambulatory Visit: Payer: Self-pay

## 2020-08-15 ENCOUNTER — Ambulatory Visit: Payer: Self-pay | Admitting: Urology

## 2020-08-21 ENCOUNTER — Ambulatory Visit: Payer: Medicare HMO | Admitting: Urology

## 2020-08-21 ENCOUNTER — Other Ambulatory Visit: Payer: Self-pay

## 2020-08-21 ENCOUNTER — Encounter: Payer: Self-pay | Admitting: Urology

## 2020-08-21 VITALS — BP 99/62 | HR 85 | Ht 72.0 in | Wt 158.0 lb

## 2020-08-21 DIAGNOSIS — C679 Malignant neoplasm of bladder, unspecified: Secondary | ICD-10-CM | POA: Diagnosis not present

## 2020-08-21 NOTE — Progress Notes (Signed)
08/21/2020 11:04 AM   Dalton Obrien 1941/04/12 [id number]  Referring provider: Juluis Pitch, MD [phone] [place],  [place] [address]  Chief Complaint  Patient presents with  . Other   Urologic history: 12/08/17 UA: 10-50RBCs  01/03/18 CT AP non contrast: 2.3cm right UVJ mass with right hydronephrosis;  01/20/18 TURBT + b/l RPGs + b/l JJ stent: T2, HG + CIS  02/23/18 FDG-PET/CT - no evidence of FDG-avid neoplasm, pulmonary nodules too small to detect on PET  03/15/18 5-FU initiated  03/15/18-04/27/18: Bladder, IMRT 6400cGy  04/12/18: 5-FU dose reduced by 25% due to severe mucositis with the first dose of 5-FU  06/26/19: TURBT shows locals recurrence of non-muscle invasive urothelial carcinoma  08/04/19: CT CAP shows concerning left pelvic sidewall LN (1.4 <-- 0.6 cm).  08/11/19: C1 Pembrolizumab  09/29/19: C3 Pembrolizumab. C/b grade 2 pneumonitis.  ... 03/01/20: START Padcev 1.25 mg/kg on days 1, 8, 15 of a 28 day schedule ...  04/05/20: C2D8 Padcev 1.25 mg/kg (subsequently held, c/f pneumonitis)  # Biomarker testing  --06/26/19: Foundation CDx - PD-L1 by 82L0, CPS=1 (positive>=10). MS-Stable. TMB - 6 Muts/Mb. CCNE1 ampl, ERBB2 ampl, MCL1 ampl, MLL2 S5385*, TERT promoter -124C>T, TP53 R175H, TSC1 S270*, [id number] ampl  --Molecular tumor board: "TSC1 mutation - eligible for MATCH trial with TAK-[id number] drug. ERBB2 amplification - there is [place] or NHER2 (trastuzumab-deruxtecan ADC) trials but would need IHC testing."  Cystoscopy 04/26/2020 - Left trigone 1cm papillary lesion, atypical cytology Cystoscopy 07/24/2020 - Slightly increased left trigone lesions   HPI: Dalton Obrien is a 80 y.o. male with a history of metastatic muscle invasive bladder cancer followed at [facility] with urologic history as above.   He wants to continue to have his care at [facility] but in the event of an acute problem also wanted to establish local urologic care  He states after his last TURBT he had  acute catheter pain and had to drive 45 minutes to [facility] to have his catheter removed.  He has recurrent tumor in the bladder and is awaiting cardiac clearance for TURBT versus starting intravesical therapy   PMH: Past Medical History:  Diagnosis Date  . Asthma   . Cancer (HCC)    Basal Cell on Face  . CHF (congestive heart failure) ([facility])   . Infiltrative basal cell carcinoma (BCC) 12/20/2008   Left Outer Zygomatic Arch    Surgical History: No past surgical history on file.  Home Medications:  Allergies as of 08/21/2020      Reactions   Penicillins Other (See Comments)   Urinary tract problems   Pyridium [phenazopyridine Hcl] Other (See Comments)      Medication List       Accurate as of August 21, 2020 11:04 AM. If you have any questions, ask your nurse or doctor.        albuterol 108 (90 Base) MCG/ACT inhaler Commonly known as: VENTOLIN HFA Inhale 2 puffs into the lungs every 6 (six) hours as needed for wheezing or shortness of breath.   aspirin 325 MG tablet Take 1 tablet (325 mg total) by mouth daily.   atorvastatin 20 MG tablet Commonly known as: LIPITOR   cetaphil lotion Apply topically.   cyanocobalamin 1000 MCG tablet Take by mouth.   desonide 0.05 % lotion Commonly known as: DESOWEN Apply topically.   fluticasone 50 MCG/ACT nasal spray Commonly known as: FLONASE Place into the nose.   furosemide 20 MG tablet Commonly known as: Lasix Take 1 tablet (20 mg  total) by mouth 2 (two) times daily.   hydrocortisone 2.5 % cream Apply topically.   hydrOXYzine 25 MG tablet Commonly known as: ATARAX/VISTARIL Take by mouth.   methylPREDNISolone 4 MG Tbpk tablet Commonly known as: MEDROL DOSEPAK Take by mouth.   Pacerone 100 MG tablet Generic drug: amiodarone Take 100 mg by mouth daily.   PREDNISONE PO Take by mouth.   Pulmicort Flexhaler 180 MCG/ACT inhaler Generic drug: budesonide Inhale 1 capsule into the lungs daily.   rivaroxaban 20  MG Tabs tablet Commonly known as: XARELTO Take 20 mg by mouth daily with supper.   saccharomyces boulardii 250 MG capsule Commonly known as: FLORASTOR Take 250 mg by mouth 2 (two) times daily.   tacrolimus 0.1 % ointment Commonly known as: PROTOPIC Apply topically.       Allergies:  Allergies  Allergen Reactions  . Penicillins Other (See Comments)    Urinary tract problems  . Pyridium [Phenazopyridine Hcl] Other (See Comments)    Family History: Family History  Problem Relation Age of Onset  . Stroke Mother   . Stroke Maternal Uncle   . Seizures Father   . Hypertension Brother     Social History:  reports that he has quit smoking. He has never used smokeless tobacco. He reports current alcohol use of about 1.0 standard drink of alcohol per week. He reports that he does not use drugs.   Physical Exam: There were no vitals taken for this visit.  Constitutional:  Alert and oriented, No acute distress. HEENT: [place] AT, moist mucus membranes.  Trachea midline, no masses. Cardiovascular: No clubbing, cyanosis, or edema. Respiratory: Normal respiratory effort, no increased work of breathing.   Assessment & Plan:    1.  Urothelial carcinoma bladder  Continue urologic care at [facility]  He will contact our office should he had an acute issue   Abbie Sons, [place] [address] [place], [place] [address] [phone]

## 2020-08-22 ENCOUNTER — Encounter: Payer: Self-pay | Admitting: Urology

## 2020-09-23 ENCOUNTER — Other Ambulatory Visit: Payer: Self-pay

## 2020-09-23 ENCOUNTER — Ambulatory Visit (INDEPENDENT_AMBULATORY_CARE_PROVIDER_SITE_OTHER): Payer: Medicare HMO | Admitting: Dermatology

## 2020-09-23 DIAGNOSIS — H61001 Unspecified perichondritis of right external ear: Secondary | ICD-10-CM

## 2020-09-23 DIAGNOSIS — L578 Other skin changes due to chronic exposure to nonionizing radiation: Secondary | ICD-10-CM

## 2020-09-23 DIAGNOSIS — C679 Malignant neoplasm of bladder, unspecified: Secondary | ICD-10-CM

## 2020-09-23 DIAGNOSIS — L570 Actinic keratosis: Secondary | ICD-10-CM | POA: Diagnosis not present

## 2020-09-23 NOTE — Progress Notes (Signed)
   Follow-Up Visit   Subjective  Dalton Obrien is a 80 y.o. male who presents for the following: Actinic Keratosis (Face and scalp - recheck for new or persistent skin lesions). Hx bx proven CNCH R ear helix - patient now sleeps with a donut shaped pillow.   The following portions of the chart were reviewed this encounter and updated as appropriate:   Tobacco  Allergies  Meds  Problems  Med Hx  Surg Hx  Fam Hx     Review of Systems:  No other skin or systemic complaints except as noted in HPI or Assessment and Plan.  Objective  Well appearing patient in no apparent distress; mood and affect are within normal limits.  A focused examination was performed including the face and scalp . Relevant physical exam findings are noted in the Assessment and Plan.  Objective  R ear helix: Clear.  Objective  Scalp and face x 16 (16): Erythematous thin papules/macules with gritty scale.   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  Observation.  Call clinic for new or changing lesions.  Recommend daily use of broad spectrum spf 30+ sunscreen to sun-exposed areas.   AK (actinic keratosis) (16) Scalp and face x 16  Destruction of lesion - Scalp and face x 16 Complexity: simple   Destruction method: cryotherapy   Informed consent: discussed and consent obtained   Timeout:  patient name, date of birth, surgical site, and procedure verified Lesion destroyed using liquid nitrogen: Yes   Region frozen until ice ball extended beyond lesion: Yes   Outcome: patient tolerated procedure well with no complications   Post-procedure details: wound care instructions given    Malignant neoplasm of urinary bladder, unspecified site [facility]) Bladder Continue oncology care  Actinic Damage - chronic, secondary to cumulative UV radiation exposure/sun exposure over time - diffuse scaly erythematous macules with  underlying dyspigmentation - Recommend daily broad spectrum sunscreen SPF 30+ to sun-exposed areas, reapply every 2 hours as needed.  - Recommend staying in the shade or wearing long sleeves, sun glasses (UVA+UVB protection) and wide brim hats (4-inch brim around the entire circumference of the hat). - Call for new or changing lesions.  Return in about 4 months (around 01/23/2021) for AK follow up .  Luther Redo, CMA, am acting as scribe for Sarina Ser, MD .  Documentation: I have reviewed the above documentation for accuracy and completeness, and I agree with the above.  Sarina Ser, MD

## 2020-09-23 NOTE — Patient Instructions (Signed)

## 2020-09-25 ENCOUNTER — Encounter: Payer: Self-pay | Admitting: Dermatology

## 2021-01-16 ENCOUNTER — Ambulatory Visit: Payer: Medicare HMO | Admitting: Dermatology

## 2021-01-20 ENCOUNTER — Ambulatory Visit: Payer: Medicare HMO | Admitting: Dermatology

## 2021-01-20 ENCOUNTER — Other Ambulatory Visit: Payer: Self-pay

## 2021-01-20 DIAGNOSIS — L578 Other skin changes due to chronic exposure to nonionizing radiation: Secondary | ICD-10-CM | POA: Diagnosis not present

## 2021-01-20 DIAGNOSIS — L570 Actinic keratosis: Secondary | ICD-10-CM

## 2021-01-20 DIAGNOSIS — L853 Xerosis cutis: Secondary | ICD-10-CM | POA: Diagnosis not present

## 2021-01-20 NOTE — Progress Notes (Signed)
Follow-Up Visit   Subjective  Dalton Obrien is a 80 y.o. male who presents for the following: Follow-up (Patient here today for 3 month AK follow up at face and scalp. Patient does notice some spots at scalp. ).  Patient accompanied by wife who contributes to history. Patient does have a hx of BCC.   Patient also has some questions about dryness and itching at lower legs.   The following portions of the chart were reviewed this encounter and updated as appropriate:   Tobacco  Allergies  Meds  Problems  Med Hx  Surg Hx  Fam Hx      Review of Systems:  No other skin or systemic complaints except as noted in HPI or Assessment and Plan.  Objective  Well appearing patient in no apparent distress; mood and affect are within normal limits.  A focused examination was performed including face, scalp, legs. Relevant physical exam findings are noted in the Assessment and Plan.  Scalp x 15, face x 5 (20) Erythematous thin papules/macules with gritty scale.    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   Actinic Damage - Severe, confluent actinic changes with pre-cancerous actinic keratoses  - Severe, chronic, not at goal, secondary to cumulative UV radiation exposure over time - diffuse scaly erythematous macules and papules with underlying dyspigmentation - Discussed  Prescription "Field Treatment" for Severe, Chronic Confluent Actinic Changes with Pre-Cancerous Actinic Keratoses Field treatment involves treatment of an entire area of skin that has confluent Actinic Changes (Sun/ Ultraviolet light damage) and PreCancerous Actinic Keratoses by method of PhotoDynamic Therapy (PDT) and/or prescription Topical Chemotherapy agents such as 5-fluorouracil, 5-fluorouracil/calcipotriene, and/or imiquimod.  The purpose is to decrease the number of clinically evident and subclinical PreCancerous lesions to prevent progression to development of skin cancer by chemically destroying early precancer changes that may or may not be visible.  It has been shown to reduce the risk of developing skin cancer in the treated area. As a result of treatment, redness, scaling, crusting, and open sores may occur during treatment course. One or more than one of these methods may be used and may have to be used several times to control, suppress and eliminate the PreCancerous changes. Discussed treatment course, expected reaction, and possible side effects. - Recommend daily broad spectrum sunscreen SPF 30+ to sun-exposed areas, reapply every 2 hours as needed.  - Staying in the shade or wearing long sleeves, sun glasses (UVA+UVB protection) and wide brim hats (4-inch brim around the entire circumference of the hat) are also recommended. - Call for new or changing lesions.  Return for PDT to scalp 4-6 weeks, 3 month AK follow up with Dr. Nehemiah Massed.  Graciella Belton, RMA, am acting as scribe for Sarina Ser, MD . Documentation: I have reviewed the above documentation for accuracy and completeness, and I agree with the above.  Shanon Brow  Nehemiah Massed, MD

## 2021-01-20 NOTE — Patient Instructions (Addendum)
Cryotherapy Aftercare  Wash gently with soap and water everyday.   Apply Vaseline and Band-Aid daily until healed.   Recommend AmLactin daily   May also use over the counter hydrocortisone 1% 1-2 times a day as needed for itch.   Recommend OTC Gold Bond Rapid Relief Anti-Itch cream (pramoxine + menthol) up to 3 times per day to areas that are itchy.  If you have any questions or concerns for your doctor, please call our main line at [phone] and press option 4 to reach your doctor's medical assistant. If no one answers, please leave a voicemail as directed and we will return your call as soon as possible. Messages left after 4 pm will be answered the following business day.   You may also send [place] a message via [place]. We typically respond to MyChart messages within 1-2 business days.  For prescription refills, please ask your pharmacy to contact our office. Our fax number is [phone].  If you have an urgent issue when the clinic is closed that cannot wait until the next business day, you can page your doctor at the number below.    Please note that while we do our best to be available for urgent issues outside of office hours, we are not available 24/7.   If you have an urgent issue and are unable to reach [place], you may choose to seek medical care at your doctor's office, retail clinic, urgent care center, or emergency room.  If you have a medical emergency, please immediately call 911 or go to the emergency department.  Pager Numbers  - Dr. Nehemiah Massed: [phone]  - Dr. Laurence Ferrari: [phone]  - Dr. Nicole Kindred: [phone]  In the event of inclement weather, please call our main line at [phone] for an update on the status of any delays or closures.  Dermatology Medication Tips: Please keep the boxes that topical medications come in in order to help keep track of the instructions about where and how to use these. Pharmacies typically print the medication instructions only on  the boxes and not directly on the medication tubes.   If your medication is too expensive, please contact our office at [phone] option 4 or send [place] a message through [place].   We are unable to tell what your co-pay for medications will be in advance as this is different depending on your insurance coverage. However, we may be able to find a substitute medication at lower cost or fill out paperwork to get insurance to cover a needed medication.   If a prior authorization is required to get your medication covered by your insurance company, please allow [place] 1-2 business days to complete this process.  Drug prices often vary depending on where the prescription is filled and some pharmacies may offer cheaper prices.  The website www.goodrx.com contains coupons for medications through different pharmacies. The prices here do not account for what the cost may be with help from insurance (it may be cheaper with your insurance), but the website can give you the price if you did not use any insurance.  - You can print the associated coupon and take it with your prescription to the pharmacy.  - You may also stop by our office during regular business hours and pick up a GoodRx coupon card.  - If you need your prescription sent electronically to a different pharmacy, notify our office through [facility] or by phone at [phone] option 4.

## 2021-01-22 ENCOUNTER — Encounter: Payer: Self-pay | Admitting: Dermatology

## 2021-01-23 ENCOUNTER — Telehealth: Payer: Self-pay

## 2021-01-23 NOTE — Telephone Encounter (Signed)
Patient's spouse called [employer] regarding PDT coverage and expected amount owed. They were advised procedure needs a PA. [employer] myself today and was told PA is not required for the CPT or the J code.  Spouse has been advised.  REF ID# [id number]

## 2021-02-03 ENCOUNTER — Encounter: Payer: Self-pay | Admitting: Dermatology

## 2021-02-11 ENCOUNTER — Telehealth: Payer: Self-pay

## 2021-02-11 NOTE — Telephone Encounter (Signed)
Patient came into the office today to cancel PDT appointment. Patient wanted to see a nurse for a visit and not a doctor.  Patient wanted to talk in private so I pulled patient in our spa area with no one around. Patient states he has had a lot going on medically and billing wise with [facility].  Patient asked me to look at his scalp under a light to determine improvement. He states he stopped using medication and only using water and places have improved. I tried to advise patient that I was only a Psychologist, sport and exercise and I was legally not allowed to determine improvement as I was not a provider of any sorts. Patient states that he was not surprised I could not help him here since no one could help him at [facility]. I asked patient to please let explain that I am not a provider of any sorts (no RN, NP or MD) I can not legally give him information as far as if his diagnosis has improved or if he still needs the PDT. Patient has asked that we postpone PDT and he will see Dr. Nehemiah Massed in October.

## 2021-02-24 ENCOUNTER — Ambulatory Visit: Payer: Medicare HMO

## 2021-04-25 ENCOUNTER — Encounter: Payer: Self-pay | Admitting: Dermatology

## 2021-04-29 ENCOUNTER — Ambulatory Visit: Payer: Medicare HMO | Admitting: Dermatology

## 2021-06-05 ENCOUNTER — Ambulatory Visit: Payer: Medicare HMO | Admitting: Dermatology

## 2021-06-23 ENCOUNTER — Ambulatory Visit: Payer: Medicare HMO | Admitting: Dermatology

## 2021-07-26 ENCOUNTER — Encounter: Payer: Self-pay | Admitting: Urology

## 2021-09-10 ENCOUNTER — Ambulatory Visit: Payer: Medicare Other | Admitting: Dermatology

## 2021-09-10 ENCOUNTER — Other Ambulatory Visit: Payer: Self-pay

## 2021-09-10 DIAGNOSIS — L570 Actinic keratosis: Secondary | ICD-10-CM | POA: Diagnosis not present

## 2021-09-10 DIAGNOSIS — L578 Other skin changes due to chronic exposure to nonionizing radiation: Secondary | ICD-10-CM | POA: Diagnosis not present

## 2021-09-10 DIAGNOSIS — L820 Inflamed seborrheic keratosis: Secondary | ICD-10-CM

## 2021-09-10 NOTE — Progress Notes (Signed)
? ?  Follow-Up Visit ?  ?Subjective  ?Dalton Obrien is a 81 y.o. male who presents for the following: Actinic Keratosis (8 months f/u face, scalp and arms ). The patient has spots, moles and lesions to be evaluated, some may be new or changing and the patient has concerns that these could be cancer.  ? ?Pt report he did not have the PDT because he is dealing  with bladder cancer  ? ?Wife with patient  ? ?The following portions of the chart were reviewed this encounter and updated as appropriate:  ? Tobacco  Allergies  Meds  Problems  Med Hx  Surg Hx  Fam Hx   ?  ?Review of Systems:  No other skin or systemic complaints except as noted in HPI or Assessment and Plan. ? ?Objective  ?Well appearing patient in no apparent distress; mood and affect are within normal limits. ? ?A focused examination was performed including face,scalp,right arm. Relevant physical exam findings are noted in the Assessment and Plan. ? ?face,ears,scalp (16) ?Erythematous thin papules/macules with gritty scale.  ? ?right elbow (6) ?Stuck-on, waxy, tan-brown papule or plaque --Discussed benign etiology and prognosis.  ? ? ?Assessment & Plan  ?AK (actinic keratosis) (16) ?face,ears,scalp ?Actinic keratoses are precancerous spots that appear secondary to cumulative UV radiation exposure/sun exposure over time. They are chronic with expected duration over 1 year. A portion of actinic keratoses will progress to squamous cell carcinoma of the skin. It is not possible to reliably predict which spots will progress to skin cancer and so treatment is recommended to prevent development of skin cancer. ? ?Recommend daily broad spectrum sunscreen SPF 30+ to sun-exposed areas, reapply every 2 hours as needed.  ?Recommend staying in the shade or wearing long sleeves, sun glasses (UVA+UVB protection) and wide brim hats (4-inch brim around the entire circumference of the hat). ?Call for new or changing lesions.  ? ?Destruction of lesion -  face,ears,scalp ?Complexity: simple   ?Destruction method: cryotherapy   ?Informed consent: discussed and consent obtained   ?Timeout:  patient name, date of birth, surgical site, and procedure verified ?Lesion destroyed using liquid nitrogen: Yes   ?Region frozen until ice ball extended beyond lesion: Yes   ?Outcome: patient tolerated procedure well with no complications   ?Post-procedure details: wound care instructions given   ? ?Inflamed seborrheic keratosis (6) ?right elbow ?Reassured benign age-related growth.  Recommend observation.  Discussed cryotherapy if spot(s) become irritated or inflamed.  ? ?Actinic Damage ?- chronic, secondary to cumulative UV radiation exposure/sun exposure over time ?- diffuse scaly erythematous macules with underlying dyspigmentation ?- Recommend daily broad spectrum sunscreen SPF 30+ to sun-exposed areas, reapply every 2 hours as needed.  ?- Recommend staying in the shade or wearing long sleeves, sun glasses (UVA+UVB protection) and wide brim hats (4-inch brim around the entire circumference of the hat). ?- Call for new or changing lesions.  ? ?Return in about 6 months (around 03/13/2022) for Aks, ISKs. ? ?I, Marye Round, CMA, am acting as scribe for Sarina Ser, MD .  ?Documentation: I have reviewed the above documentation for accuracy and completeness, and I agree with the above. ? ?Sarina Ser, MD ? ?

## 2021-09-10 NOTE — Patient Instructions (Addendum)

## 2021-09-14 ENCOUNTER — Encounter: Payer: Self-pay | Admitting: Dermatology

## 2021-09-18 ENCOUNTER — Other Ambulatory Visit: Payer: Self-pay | Admitting: Student

## 2021-09-26 ENCOUNTER — Other Ambulatory Visit: Payer: Self-pay

## 2021-09-26 ENCOUNTER — Inpatient Hospital Stay
Admission: EM | Admit: 2021-09-26 | Discharge: 2021-09-30 | DRG: 698 | Disposition: A | Discharge status: Home / Self Care | Payer: Medicare Other | Attending: Internal Medicine | Admitting: Internal Medicine

## 2021-09-26 ENCOUNTER — Encounter: Payer: Self-pay | Admitting: Emergency Medicine

## 2021-09-26 DIAGNOSIS — I9589 Other hypotension: Secondary | ICD-10-CM | POA: Diagnosis present

## 2021-09-26 DIAGNOSIS — I5022 Chronic systolic (congestive) heart failure: Secondary | ICD-10-CM | POA: Insufficient documentation

## 2021-09-26 DIAGNOSIS — C7800 Secondary malignant neoplasm of unspecified lung: Secondary | ICD-10-CM | POA: Diagnosis present

## 2021-09-26 DIAGNOSIS — I429 Cardiomyopathy, unspecified: Secondary | ICD-10-CM | POA: Diagnosis present

## 2021-09-26 DIAGNOSIS — F802 Mixed receptive-expressive language disorder: Secondary | ICD-10-CM | POA: Diagnosis not present

## 2021-09-26 DIAGNOSIS — I480 Paroxysmal atrial fibrillation: Secondary | ICD-10-CM | POA: Diagnosis present

## 2021-09-26 DIAGNOSIS — Z87891 Personal history of nicotine dependence: Secondary | ICD-10-CM

## 2021-09-26 DIAGNOSIS — I447 Left bundle-branch block, unspecified: Secondary | ICD-10-CM | POA: Diagnosis present

## 2021-09-26 DIAGNOSIS — C679 Malignant neoplasm of bladder, unspecified: Secondary | ICD-10-CM | POA: Diagnosis not present

## 2021-09-26 DIAGNOSIS — J45909 Unspecified asthma, uncomplicated: Secondary | ICD-10-CM | POA: Diagnosis present

## 2021-09-26 DIAGNOSIS — I493 Ventricular premature depolarization: Secondary | ICD-10-CM | POA: Diagnosis present

## 2021-09-26 DIAGNOSIS — I5084 End stage heart failure: Secondary | ICD-10-CM | POA: Diagnosis present

## 2021-09-26 DIAGNOSIS — I959 Hypotension, unspecified: Secondary | ICD-10-CM | POA: Diagnosis present

## 2021-09-26 DIAGNOSIS — Y842 Radiological procedure and radiotherapy as the cause of abnormal reaction of the patient, or of later complication, without mention of misadventure at the time of the procedure: Secondary | ICD-10-CM | POA: Diagnosis present

## 2021-09-26 DIAGNOSIS — R339 Retention of urine, unspecified: Secondary | ICD-10-CM | POA: Diagnosis not present

## 2021-09-26 DIAGNOSIS — D62 Acute posthemorrhagic anemia: Secondary | ICD-10-CM | POA: Diagnosis present

## 2021-09-26 DIAGNOSIS — N1831 Chronic kidney disease, stage 3a: Secondary | ICD-10-CM | POA: Diagnosis present

## 2021-09-26 DIAGNOSIS — Z86718 Personal history of other venous thrombosis and embolism: Secondary | ICD-10-CM | POA: Diagnosis not present

## 2021-09-26 DIAGNOSIS — Z7901 Long term (current) use of anticoagulants: Secondary | ICD-10-CM

## 2021-09-26 DIAGNOSIS — E785 Hyperlipidemia, unspecified: Secondary | ICD-10-CM | POA: Diagnosis present

## 2021-09-26 DIAGNOSIS — Z923 Personal history of irradiation: Secondary | ICD-10-CM

## 2021-09-26 DIAGNOSIS — Y92009 Unspecified place in unspecified non-institutional (private) residence as the place of occurrence of the external cause: Secondary | ICD-10-CM

## 2021-09-26 DIAGNOSIS — R54 Age-related physical debility: Secondary | ICD-10-CM | POA: Diagnosis present

## 2021-09-26 DIAGNOSIS — Z8551 Personal history of malignant neoplasm of bladder: Secondary | ICD-10-CM | POA: Diagnosis not present

## 2021-09-26 DIAGNOSIS — N3041 Irradiation cystitis with hematuria: Secondary | ICD-10-CM | POA: Diagnosis present

## 2021-09-26 DIAGNOSIS — I69321 Dysphasia following cerebral infarction: Secondary | ICD-10-CM

## 2021-09-26 DIAGNOSIS — I5023 Acute on chronic systolic (congestive) heart failure: Secondary | ICD-10-CM | POA: Diagnosis not present

## 2021-09-26 DIAGNOSIS — Z9221 Personal history of antineoplastic chemotherapy: Secondary | ICD-10-CM

## 2021-09-26 DIAGNOSIS — C670 Malignant neoplasm of trigone of bladder: Secondary | ICD-10-CM

## 2021-09-26 DIAGNOSIS — I639 Cerebral infarction, unspecified: Secondary | ICD-10-CM | POA: Diagnosis not present

## 2021-09-26 DIAGNOSIS — Z95810 Presence of automatic (implantable) cardiac defibrillator: Secondary | ICD-10-CM

## 2021-09-26 DIAGNOSIS — Z823 Family history of stroke: Secondary | ICD-10-CM

## 2021-09-26 DIAGNOSIS — I5043 Acute on chronic combined systolic (congestive) and diastolic (congestive) heart failure: Secondary | ICD-10-CM | POA: Diagnosis present

## 2021-09-26 DIAGNOSIS — D6832 Hemorrhagic disorder due to extrinsic circulating anticoagulants: Secondary | ICD-10-CM | POA: Diagnosis present

## 2021-09-26 DIAGNOSIS — D649 Anemia, unspecified: Secondary | ICD-10-CM | POA: Diagnosis not present

## 2021-09-26 DIAGNOSIS — T45515A Adverse effect of anticoagulants, initial encounter: Secondary | ICD-10-CM | POA: Diagnosis present

## 2021-09-26 DIAGNOSIS — R310 Gross hematuria: Secondary | ICD-10-CM | POA: Diagnosis present

## 2021-09-26 DIAGNOSIS — Z85828 Personal history of other malignant neoplasm of skin: Secondary | ICD-10-CM | POA: Diagnosis not present

## 2021-09-26 DIAGNOSIS — Z8249 Family history of ischemic heart disease and other diseases of the circulatory system: Secondary | ICD-10-CM | POA: Diagnosis not present

## 2021-09-26 DIAGNOSIS — R319 Hematuria, unspecified: Secondary | ICD-10-CM | POA: Diagnosis not present

## 2021-09-26 DIAGNOSIS — I82409 Acute embolism and thrombosis of unspecified deep veins of unspecified lower extremity: Secondary | ICD-10-CM | POA: Diagnosis present

## 2021-09-26 HISTORY — DX: Malignant neoplasm of bladder, unspecified: C67.9

## 2021-09-26 LAB — CBC
HCT: 20.2 % — ABNORMAL LOW (ref 39.0–52.0)
HCT: 18.5 % — ABNORMAL LOW (ref 39.0–52.0)
HCT: 20.9 % — ABNORMAL LOW (ref 39.0–52.0)
Hemoglobin: 6.5 g/dL — ABNORMAL LOW (ref 13.0–17.0)
Hemoglobin: 5.9 g/dL — ABNORMAL LOW (ref 13.0–17.0)
Hemoglobin: 6.7 g/dL — ABNORMAL LOW (ref 13.0–17.0)
MCH: 33.9 pg (ref 26.0–34.0)
MCH: 33.9 pg (ref 26.0–34.0)
MCH: 31.5 pg (ref 26.0–34.0)
MCHC: 32.2 g/dL (ref 30.0–36.0)
MCHC: 31.9 g/dL (ref 30.0–36.0)
MCHC: 32.1 g/dL (ref 30.0–36.0)
MCV: 105.2 fL — ABNORMAL HIGH (ref 80.0–100.0)
MCV: 106.3 fL — ABNORMAL HIGH (ref 80.0–100.0)
MCV: 98.1 fL (ref 80.0–100.0)
Platelets: 159 10*3/uL (ref 150–400)
Platelets: 146 10*3/uL — ABNORMAL LOW (ref 150–400)
Platelets: 124 10*3/uL — ABNORMAL LOW (ref 150–400)
RBC: 1.92 MIL/uL — ABNORMAL LOW (ref 4.22–5.81)
RBC: 1.74 MIL/uL — ABNORMAL LOW (ref 4.22–5.81)
RBC: 2.13 MIL/uL — ABNORMAL LOW (ref 4.22–5.81)
RDW: 14.6 % (ref 11.5–15.5)
RDW: 14.6 % (ref 11.5–15.5)
RDW: 17.5 % — ABNORMAL HIGH (ref 11.5–15.5)
WBC: 5 10*3/uL (ref 4.0–10.5)
WBC: 4.5 10*3/uL (ref 4.0–10.5)
WBC: 4.3 10*3/uL (ref 4.0–10.5)
nRBC: 0 % (ref 0.0–0.2)
nRBC: 0 % (ref 0.0–0.2)
nRBC: 0 % (ref 0.0–0.2)

## 2021-09-26 LAB — URINALYSIS, ROUTINE W REFLEX MICROSCOPIC
Bacteria, UA: NONE SEEN
RBC / HPF: >50 RBC/hpf — ABNORMAL HIGH (ref 0–5)
Specific Gravity, Urine: 1.02 (ref 1.005–1.030)
Squamous Epithelial / HPF: NONE SEEN (ref 0–5)
WBC, UA: >50 WBC/hpf — ABNORMAL HIGH (ref 0–5)

## 2021-09-26 LAB — BASIC METABOLIC PANEL
Anion gap: 6 (ref 5–15)
BUN: 30 mg/dL — ABNORMAL HIGH (ref 8–23)
CO2: 28 mmol/L (ref 22–32)
Calcium: 8.5 mg/dL — ABNORMAL LOW (ref 8.9–10.3)
Chloride: 107 mmol/L (ref 98–111)
Creatinine, Ser: 1.71 mg/dL — ABNORMAL HIGH (ref 0.61–1.24)
GFR, Estimated: 40 mL/min — ABNORMAL LOW (ref >60)
Glucose, Bld: 112 mg/dL — ABNORMAL HIGH (ref 70–99)
Potassium: 3.9 mmol/L (ref 3.5–5.1)
Sodium: 141 mmol/L (ref 135–145)

## 2021-09-26 LAB — APTT: aPTT: 28 seconds (ref 24–36)

## 2021-09-26 LAB — PREPARE RBC (CROSSMATCH)

## 2021-09-26 LAB — ABO/RH: ABO/RH(D): B POS

## 2021-09-26 LAB — BRAIN NATRIURETIC PEPTIDE: B Natriuretic Peptide: 1454.6 pg/mL — ABNORMAL HIGH (ref 0.0–100.0)

## 2021-09-26 LAB — PROTIME-INR
INR: 1.2 (ref 0.8–1.2)
Prothrombin Time: 15.1 seconds (ref 11.4–15.2)

## 2021-09-26 MED ORDER — ATORVASTATIN CALCIUM 20 MG PO TABS
20.0000 mg | ORAL_TABLET | Freq: Every evening | ORAL | Status: DC
  Administered 2021-09-26 – 2021-09-29 (×4): 20 mg via ORAL
  Filled 2021-09-26 (×4): qty 1

## 2021-09-26 MED ORDER — HYDROCORTISONE 1 % EX CREA
TOPICAL_CREAM | Freq: Every day | CUTANEOUS | Status: DC | PRN
  Filled 2021-09-26: qty 28

## 2021-09-26 MED ORDER — AMIODARONE HCL 200 MG PO TABS
100.0000 mg | ORAL_TABLET | Freq: Every day | ORAL | Status: DC
  Administered 2021-09-26 – 2021-09-30 (×5): 100 mg via ORAL
  Filled 2021-09-26 (×5): qty 1

## 2021-09-26 MED ORDER — ONDANSETRON HCL 4 MG/2ML IJ SOLN: 4.0000 mg | Freq: Three times a day (TID) | INTRAMUSCULAR | Status: DC | PRN

## 2021-09-26 MED ORDER — SODIUM CHLORIDE 0.9 % IR SOLN
3000.0000 mL | Status: DC
Start: 2021-09-26 — End: 2021-09-26

## 2021-09-26 MED ORDER — SODIUM CHLORIDE 0.9 % IV SOLN
10.0000 mL/h | Freq: Once | INTRAVENOUS | Status: AC
  Administered 2021-09-26: 10 mL/h via INTRAVENOUS

## 2021-09-26 MED ORDER — POLYETHYLENE GLYCOL 3350 17 G PO PACK
17.0000 g | PACK | Freq: Every day | ORAL | Status: DC | PRN
  Administered 2021-09-27: 17 g via ORAL
  Filled 2021-09-26: qty 1

## 2021-09-26 MED ORDER — FUROSEMIDE 40 MG PO TABS
20.0000 mg | ORAL_TABLET | Freq: Two times a day (BID) | ORAL | Status: DC
  Filled 2021-09-26 (×2): qty 1

## 2021-09-26 MED ORDER — DM-GUAIFENESIN ER 30-600 MG PO TB12
1.0000 | ORAL_TABLET | Freq: Two times a day (BID) | ORAL | Status: DC | PRN
Start: 2021-09-26 — End: 2021-09-30

## 2021-09-26 MED ORDER — CEFTRIAXONE SODIUM 1 G IJ SOLR
1.0000 g | INTRAMUSCULAR | Status: DC
  Administered 2021-09-26 – 2021-09-30 (×5): 1 g via INTRAVENOUS
  Filled 2021-09-26 (×2): qty 1
  Filled 2021-09-26: qty 10
  Filled 2021-09-26: qty 1
  Filled 2021-09-26: qty 10

## 2021-09-26 MED ORDER — MORPHINE SULFATE (PF) 2 MG/ML IV SOLN
0.5000 mg | INTRAVENOUS | Status: DC | PRN
Start: 2021-09-26 — End: 2021-09-30
  Filled 2021-09-26: qty 1

## 2021-09-26 MED ORDER — OXYCODONE-ACETAMINOPHEN 5-325 MG PO TABS
1.0000 | ORAL_TABLET | Freq: Four times a day (QID) | ORAL | Status: DC | PRN
  Administered 2021-09-27 – 2021-09-28 (×2): 1 via ORAL
  Filled 2021-09-26 (×2): qty 1

## 2021-09-26 MED ORDER — ACETAMINOPHEN 325 MG PO TABS
650.0000 mg | ORAL_TABLET | Freq: Four times a day (QID) | ORAL | Status: DC | PRN
  Administered 2021-09-26 – 2021-09-27 (×3): 650 mg via ORAL
  Filled 2021-09-26 (×3): qty 2

## 2021-09-26 MED ORDER — ALBUTEROL SULFATE (2.5 MG/3ML) 0.083% IN NEBU: 3.0000 mL | INHALATION_SOLUTION | RESPIRATORY_TRACT | Status: DC | PRN

## 2021-09-26 MED ORDER — SODIUM CHLORIDE 0.9% IV SOLUTION: Freq: Once | INTRAVENOUS | Status: AC

## 2021-09-26 MED ORDER — FUROSEMIDE 10 MG/ML IJ SOLN
20.0000 mg | Freq: Once | INTRAMUSCULAR | Status: DC
  Filled 2021-09-26: qty 2

## 2021-09-26 MED ORDER — DOCUSATE SODIUM 100 MG PO CAPS
100.0000 mg | ORAL_CAPSULE | Freq: Two times a day (BID) | ORAL | Status: DC
  Administered 2021-09-26 – 2021-09-30 (×9): 100 mg via ORAL
  Filled 2021-09-26 (×9): qty 1

## 2021-09-26 NOTE — Progress Notes (Signed)
Admission profile updated. ?

## 2021-09-26 NOTE — Consult Note (Signed)
?  ? ?Urology Consult  ? ?I have been asked to see the patient by Dr. Blaine Hamper, for evaluation and management of gross hematuria and bladder cancer. ? ?Chief Complaint: Gross hematuria ? ?HPI:  ?Dalton Obrien is a 81 y.o.  male managed at [facility] for metastatic bladder cancer with numerous prior treatments( including chemo/radiation, and multiple TURBTs, initial diagnosis HG T2 01/2018, most recent TURBT 10/2020).  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 He has had worsening gross hematuria over the last 2-3 months, with increase in his hematuria and some difficulty/urgency with voiding which resulted in him presenting to the ED early this morning.  He also had hypotension at that time, and anemia with hematocrit 18.5.  Notably, he was also hypotensive with blood pressure 80/60 at his clinic visit with oncology last week.  He actually originally had an outpatient cystoscopy scheduled with [facility] today at 10 AM.  ? ?He is followed by Dr. 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  He is anticoagulated with Xarelto and aspirin. Last dose of xarelto was 09/24/2021. ? ?He was recently seen on 09/18/2021 by his oncologist Dr. Aline Brochure at [facility], and was found to have a low hemoglobin of 10.3, and he referred him to his urologist Dr. Brigitte Pulse at [facility] to see if he would be a candidate for TURBT, and if this was even feasible with his severe cardiomyopathy and EF of only 10 to 15%.  A CT chest abdomen and pelvis was performed at that visit and showed increased masslike thickening in the posterior bladder concerning for bladder cancer recurrence, as well as multiple pulmonary nodules that were stable, no hydronephrosis noted.  Imaging is in the [facility] system, so unfortunately I am unable to personally review those images. ? ?He has been voiding dark red urine with some clots today, and having some  urgency, but denies any lower abdominal discomfort.  Bladder scan was initially 300 mL in ER, but he was able to void 320 mL maroon urine shortly after.  This afternoon, he has maroon-colored urine in his urinal with no clots noted. ? ?His most recent TURBT was in April 2022 at [facility], and apparently he had so much pain from his Foley catheter that he return to the hospital 30 minutes after discharge to have it removed.  He does not want to have another Foley catheter. ? ? ? ?PMH: ?Past Medical History:  ?Diagnosis Date  ? Asthma   ? Bladder cancer ([place])   ? Cancer [facility])   ? Basal Cell on Face  ? CHF (congestive heart failure) ([place])   ? 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 Left Outer Zygomatic Arch  ? ? ?Surgical History: ?Past Surgical History:  ?Procedure Laterality Date  ? bladder surgery for bladder cancer    ? ? ?Allergies:  ?Allergies  ?Allergen Reactions  ? Phenazopyridine Rash  ?  Severe and itchy  ? Penicillins Other (See Comments)  ?  Urinary tract problems  ? Pyridium [Phenazopyridine Hcl] Other (See Comments)  ? ? ?Family History: ?Family History  ?Problem Relation Age of Onset  ? Stroke Mother   ? Stroke Maternal Uncle   ? Seizures Father   ? Hypertension Brother   ? ? ?Social History:  reports that he has quit smoking. He has never used smokeless tobacco. He reports current alcohol use of about 1.0 standard  drink per week. He reports that he does not use drugs. ? ?ROS: ?Negative aside from those stated in the HPI. ? ?Physical Exam: ?BP (!) 87/56 (BP Location: Right Arm)   Pulse 66   Temp 97.6 ?F (36.4 ?C) (Oral)   Resp 18   Ht 6' (1.829 m)   Wt 74.6 kg   SpO2 100%   BMI 22.31 kg/m?   ? ?Constitutional: Elderly, frail-appearing, comfortable ?Cardiovascular: No clubbing, cyanosis, or edema. ?Respiratory: Normal respiratory effort, no increased work of breathing. ?GI: Abdomen is soft, nontender, nondistended, no abdominal masses ?GU: No blood at the meatus ?Maroon-colored urine in  urinal without clots ? ?Laboratory Data: ?Reviewed in epic ? ?Pertinent Imaging: ?See HPI, images in [facility] system and unable to personally review ? ?Assessment & Plan:   ?81 year old very comorbid male with dementia, cardiomyopathy with ejection fraction of 10 to 15%, history of stroke on Xarelto and aspirin, history of muscle invasive metastatic bladder cancer managed by [facility], who presents with worsening hematuria over the last 2 to 3 months and increased urgency with passage of clots over the last few days. ? ?I had a very long conversation with the patient and his wife this evening about his numerous comorbidities in the setting of his suspected metastatic bladder cancer and ongoing hematuria over the last few months, with significant anemia and likely resulting hypotension.  He is a very high risk surgical candidate with his cardiac history and ejection fraction of 10 to 15%.  He also would like to avoid a Foley catheter if at all possible, and he only tolerated a catheter after his last TURBT at [facility] in April 2022 for 30 minutes before returning to the hospital to having it removed. ? ?He continues to empty his bladder well, and his hematuria has been chronic for at least 2 to 3 months.  He stopped Xarelto less than 48 hours ago, and hematuria seems to be improving today and he continues to void spontaneously.  We discussed options including observation with hydration and transfusion, catheter placement for irrigation and consideration of CBI, or even consideration of OR for cystoscopy/clot evacuation/TURBT.  Risks and benefits were discussed extensively.  I recommended starting with holding his anticoagulants and observation, as he continues to void spontaneously and is not in any pain or retention at this time ? ?Recommendations: ? ?-Hold Xarelto and aspirin ?-Recommend resuscitation and transfusion per hospitalist ?-Would only consider Foley catheter if acute clot retention ?-Will communicate with his [place]  urology and oncology team to coordinate close follow-up next week ?-Urology will continue to follow ? ?Billey Co, MD ? ?Total time spent on the floor was 120 minutes, with greater than 50% spent in counseling and coordination of care with the patient regarding bladder cancer cardiomyopathy, gross hematuria, and limited treatment options with his frailty and comorbidities ? ?[facility] ?[address] ?[place], [place] [address] ?[phone] ? ? ?

## 2021-09-26 NOTE — Plan of Care (Signed)
  Problem: Health Behavior/Discharge Planning: Goal: Ability to manage health-related needs will improve Outcome: Progressing   

## 2021-09-26 NOTE — ED Triage Notes (Signed)
Pt arrived via [facility] from home with reports of bleeding from his penis, pt states it has been going on for several weeks, hx of bladder CA.  ? ?Per EMS about 50cc blood was seen on scene. ?

## 2021-09-26 NOTE — H&P (Signed)
?History and Physical  ? ? ?Dalton Obrien Dalton Obrien:[id number] DOB: 06/21/1941 DOA: 09/26/2021 ? ?Referring MD/NP/PA:  ? ?PCP: [place] Pink, MD  ? ?Patient coming from:  The patient is coming from home.  At baseline, pt is independent for most of ADL.       ? ?Chief Complaint: hematuria ? ?HPI: [name] is a 81 y.o. 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 ? ?Patient states that he has mild intermittent hematuria for more than 5 years, in the past 2 months, he has more frequent gross hematuria, which has been worsening in the past several days.  He also reports dysuria and burning on urination.  Sometimes he has difficult urinating.  Patient does not have chest pain, cough, shortness of breath.  No nausea, vomiting, diarrhea or abdominal pain. Pt states that he has an appointment today at 10:00 today at [facility] with a bladder surgeon to do a cystoscopy. He has not taken his Xarelto in the past 2 days. ? ?Data Reviewed and ED Course: pt was found to have hemoglobin 6.5 (10.3 on 09/18/2021), WBC 5.0, urinalysis (cloudy appearance, negative bacteria, WBC > 50, pending leukocyte), worsening renal function, temperature normal, blood pressure 93/60, heart rate 60-80s, RR 18, oxygen saturation 99% on room air.  Patient is admitted to telemetry bed as inpatient.  Dr. Diamantina Providence of urology is consulted. EDP also talked to Dr. Zigmund Daniel of [facility] urology.  ? ? ?EKG: Not done in ED, will get one.    ? ? ?Review of Systems:  ? ?General: no fevers, chills, no body weight gain, fatigue ?HEENT: no blurry vision, hearing changes or sore throat ?Respiratory: no dyspnea, coughing, wheezing ?CV: no chest pain, no palpitations ?GI: no nausea, vomiting, abdominal pain, diarrhea, constipation ?GU: has dysuria, burning on urination, increased urinary  frequency, hematuria  ?Ext: has trace leg edema ?Neuro: no unilateral weakness, numbness, or tingling, no vision change or hearing loss ?Skin: no rash, no skin tear. ?MSK: No muscle spasm, no deformity, no limitation of range of movement in spin ?Heme: No easy bruising.  ?Travel history: No recent long distant travel. ? ? ?Allergy:  ?Allergies  ?Allergen Reactions  ? Phenazopyridine Rash  ?  Severe and itchy  ? Penicillins Other (See Comments)  ?  Urinary tract problems  ? Pyridium [Phenazopyridine Hcl] Other (See Comments)  ? ? ?Past Medical History:  ?Diagnosis Date  ? Asthma   ? Bladder cancer ([place])   ? Cancer [facility])   ? Basal Cell on Face  ? CHF (congestive heart failure) ([place])   ? 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 Left Outer Zygomatic Arch  ? ? ?Past Surgical History:  ?Procedure Laterality Date  ? bladder surgery for bladder cancer    ? ? ?Social History:  reports that he has quit smoking. He has never used smokeless tobacco. He reports current alcohol use of about 1.0 standard drink per week. He reports that he does not use drugs. ? ?Family History:  ?Family History  ?Problem Relation Age of Onset  ? Stroke Mother   ? Stroke Maternal Uncle   ? Seizures Father   ? Hypertension Brother   ?  ? ?Prior to Admission medications   ?Medication Sig Start Date End Date Taking? Authorizing Provider  ?albuterol (PROVENTIL HFA;VENTOLIN  HFA) 108 (90 Base) MCG/ACT inhaler Inhale 2 puffs into the lungs every 6 (six) hours as needed for wheezing or shortness of breath. 04/02/16   Daymon Larsen, MD  ?aspirin 325 MG tablet Take 1 tablet (325 mg total) by mouth daily. 08/22/15   Dustin Flock, MD  ?atorvastatin (LIPITOR) 20 MG tablet  03/31/20   [provider]  ?cetaphil (CETAPHIL) lotion Apply topically.    [provider]  ?cyanocobalamin 1000 MCG tablet Take by mouth. ?Patient not taking: Reported on 01/20/2021    [provider]  ?desonide (DESOWEN) 0.05 % lotion Apply  topically.    [provider]  ?fluticasone (FLONASE) 50 MCG/ACT nasal spray Place into the nose. ?Patient not taking: Reported on 01/20/2021    [provider]  ?furosemide (LASIX) 20 MG tablet Take 1 tablet (20 mg total) by mouth 2 (two) times daily. 04/02/16 04/02/17  Daymon Larsen, MD  ?hydrocortisone 2.5 % cream Apply topically. 07/29/20   [provider]  ?hydrOXYzine (ATARAX/VISTARIL) 25 MG tablet Take by mouth. ?Patient not taking: Reported on 01/20/2021 03/15/20   [provider]  ?methylPREDNISolone (MEDROL DOSEPAK) 4 MG TBPK tablet Take by mouth.    [provider]  ?PACERONE 100 MG tablet Take 100 mg by mouth daily. 03/25/20   [provider]  ?PREDNISONE PO Take by mouth.    [provider]  ?Doug Sou 180 MCG/ACT inhaler Inhale 1 capsule into the lungs daily. 03/24/16   [provider]  ?rivaroxaban (XARELTO) 20 MG TABS tablet Take 20 mg by mouth daily with supper.    [provider]  ?saccharomyces boulardii (FLORASTOR) 250 MG capsule Take 250 mg by mouth 2 (two) times daily.    [provider]  ? ? ?Physical Exam: ?Vitals:  ? 09/26/21 0635 09/26/21 0700 09/26/21 0730 09/26/21 0805  ?BP: '93/60 96/61 96/63 '$   ?Pulse: 70 68 70   ?Resp: 18 18    ?Temp:    98.1 ?F (36.7 ?C)  ?TempSrc:    Oral  ?SpO2: 100% 99% 100%   ?Weight:      ?Height:      ? ?General: Not in acute distress ?HEENT: ?      Eyes: PERRL, EOMI, no scleral icterus. ?      ENT: No discharge from the ears and nose, no pharynx injection, no tonsillar enlargement.  ?      Neck: No JVD, no bruit, no mass felt. ?Heme: No neck lymph node enlargement. ?Cardiac: S1/S2, RRR, No murmurs, No gallops or rubs. ?Respiratory: No rales, wheezing, rhonchi or rubs. ?GI: Soft, nondistended, nontender, no rebound pain, no organomegaly, BS present. ?GU: has gross hematuria ?Ext: has trace leg edema bilaterally. 1+DP/PT pulse bilaterally. ?Musculoskeletal: No joint  deformities, No joint redness or warmth, no limitation of ROM in spin. ?Skin: No rashes.  ?Neuro: Alert, oriented X3, cranial nerves II-XII grossly intact, moves all extremities normally.  ?Psych: Patient is not psychotic, no suicidal or hemocidal ideation. ? ?Labs on Admission: I have personally reviewed following labs and imaging studies ? ?CBC: ?Recent Labs  ?Lab 09/26/21 ?0500  ?WBC 5.0  ?HGB 6.5*  ?HCT 20.2*  ?MCV 105.2*  ?PLT 159  ? ?Basic Metabolic Panel: ?Recent Labs  ?Lab 09/26/21 ?0500  ?NA 141  ?K 3.9  ?CL 107  ?CO2 28  ?GLUCOSE 112*  ?BUN 30*  ?CREATININE 1.71*  ?CALCIUM 8.5*  ? ?GFR: ?Estimated Creatinine Clearance: 36.4 mL/min (A) (by C-G formula based on SCr of  1.71 mg/dL (H)). ?Liver Function Tests: ?No results for input(s): AST, ALT, ALKPHOS, BILITOT, PROT, ALBUMIN in the last 168 hours. ?No results for input(s): LIPASE, AMYLASE in the last 168 hours. ?No results for input(s): AMMONIA in the last 168 hours. ?Coagulation Profile: ?No results for input(s): INR, PROTIME in the last 168 hours. ?Cardiac Enzymes: ?No results for input(s): CKTOTAL, CKMB, CKMBINDEX, TROPONINI in the last 168 hours. ?BNP (last 3 results) ?No results for input(s): PROBNP in the last 8760 hours. ?HbA1C: ?No results for input(s): HGBA1C in the last 72 hours. ?CBG: ?No results for input(s): GLUCAP in the last 168 hours. ?Lipid Profile: ?No results for input(s): CHOL, HDL, LDLCALC, TRIG, CHOLHDL, LDLDIRECT in the last 72 hours. ?Thyroid Function Tests: ?No results for input(s): TSH, T4TOTAL, FREET4, T3FREE, THYROIDAB in the last 72 hours. ?Anemia Panel: ?No results for input(s): VITAMINB12, FOLATE, FERRITIN, TIBC, IRON, RETICCTPCT in the last 72 hours. ?Urine analysis: ?   ?Component Value Date/Time  ? COLORURINE RED (A) 09/26/2021 0500  ? APPEARANCEUR CLOUDY (A) 09/26/2021 0500  ? LABSPEC 1.020 09/26/2021 0500  ? PHURINE  09/26/2021 0500  ?  TEST NOT REPORTED DUE TO COLOR INTERFERENCE OF URINE PIGMENT  ? GLUCOSEU (A) 09/26/2021  0500  ?  TEST NOT REPORTED DUE TO COLOR INTERFERENCE OF URINE PIGMENT  ? HGBUR (A) 09/26/2021 0500  ?  TEST NOT REPORTED DUE TO COLOR INTERFERENCE OF URINE PIGMENT  ? BILIRUBINUR (A) 09/26/2021 0500  ?  TEST NOT REPORTED

## 2021-09-26 NOTE — Consult Note (Signed)
Full consult note to follow ? ?Briefly, 81 year old comorbid man on Xarelto and aspirin with metastatic bladder cancer managed at [facility].  He has had persistent gross hematuria over the last 2 months that has worsened over the last 24 hours resulting in his ER visit today.  CT scan 09/18/2021 at [facility] showed increased posterior bladder wall thickening concerning for recurrence of bladder cancer, but no evidence of clot within the bladder. ? ?He is voiding spontaneously, PVR reasonable at 300 mL.  He has not tolerated catheters well in the past. ? ?Hematocrit 20.2 from 32 on 09/18/2021. ? ?Recommendations: ?Hold Xarelto and aspirin ?Agree with transfusion and resuscitation ?We will try to avoid Foley catheter as long as he continues to void spontaneously ?No plan for surgical intervention today ? ?Nickolas Madrid, MD ?09/26/2021 ? ?

## 2021-09-26 NOTE — ED Provider Notes (Addendum)
? ?[facility] ?Provider Note ? ? ? Event Date/Time  ? First MD Initiated Contact with Patient 09/26/21 [phone]   ?  (approximate) ? ? ?History  ? ?Hematuria and Bleeding from Penis ? ? ?HPI ? ?Dalton Obrien is a 81 y.o. male with a history of bladder cancer and lung metastasis.  He also has a history of Warnicke's encephalopathy he says.  He has been having hematuria off and on for several weeks along with abdominal pain intermittently for several weeks.  Today he had an episode where he apparently could not make it to the toilet in time and put bloody urine out before getting to the toilet.  He got very worried and came to the hospital.  Reportedly his blood pressure of 109/71 is about normal.  His heart rate is not elevated.  He is not having any pain right now.  He has an appointment today at 10:00 at [facility] with a bladder surgeon to do a cystoscopy. ? ?  ? ? ?Physical Exam  ? ?Triage Vital Signs: ?ED Triage Vitals  ?Enc Vitals Group  ?   BP 09/26/21 0448 109/71  ?   Pulse Rate 09/26/21 0448 88  ?   Resp 09/26/21 0448 16  ?   Temp 09/26/21 0448 98 ?F (36.7 ?C)  ?   Temp Source 09/26/21 0448 Oral  ?   SpO2 09/26/21 0448 100 %  ?   Weight 09/26/21 0449 164 lb 8 oz (74.6 kg)  ?   Height 09/26/21 0449 6' (1.829 m)  ?   Head Circumference --   ?   Peak Flow --   ?   Pain Score 09/26/21 0449 0  ?   Pain Loc --   ?   Pain Edu? --   ?   Excl. in [place]? --   ? ? ?Most recent vital signs: ?Vitals:  ? 09/26/21 0635 09/26/21 0700  ?BP: 93/60 96/61  ?Pulse: 70 68  ?Resp: 18 18  ?Temp:    ?SpO2: 100% 99%  ? ? ?General: Awake, no distress.  ?CV:  Good peripheral perfusion.  Heart regular rate and rhythm no audible murmurs ?Resp:  Normal effort.  Lungs are clear ?Abd:  No distention.  Soft no organomegaly no distended bladder that I can palpate ? ? ? ?ED Results / Procedures / Treatments  ? ?Labs ?(all labs ordered are listed, but only abnormal results are displayed) ?Labs Reviewed  ?URINALYSIS, ROUTINE W REFLEX  MICROSCOPIC - Abnormal; Notable for the following components:  ?    Result Value  ? Color, Urine RED (*)   ? APPearance CLOUDY (*)   ? Glucose, UA   (*)   ? Value: TEST NOT REPORTED DUE TO COLOR INTERFERENCE OF URINE PIGMENT  ? Hgb urine dipstick   (*)   ? Value: TEST NOT REPORTED DUE TO COLOR INTERFERENCE OF URINE PIGMENT  ? Bilirubin Urine   (*)   ? Value: TEST NOT REPORTED DUE TO COLOR INTERFERENCE OF URINE PIGMENT  ? Ketones, ur   (*)   ? Value: TEST NOT REPORTED DUE TO COLOR INTERFERENCE OF URINE PIGMENT  ? Protein, ur   (*)   ? Value: TEST NOT REPORTED DUE TO COLOR INTERFERENCE OF URINE PIGMENT  ? Nitrite   (*)   ? Value: TEST NOT REPORTED DUE TO COLOR INTERFERENCE OF URINE PIGMENT  ? Leukocytes,Ua   (*)   ? Value: TEST NOT REPORTED DUE TO COLOR INTERFERENCE OF URINE  PIGMENT  ? RBC / HPF >50 (*)   ? WBC, UA >50 (*)   ? All other components within normal limits  ?CBC - Abnormal; Notable for the following components:  ? RBC 1.92 (*)   ? Hemoglobin 6.5 (*)   ? HCT 20.2 (*)   ? MCV 105.2 (*)   ? All other components within normal limits  ?BASIC METABOLIC PANEL - Abnormal; Notable for the following components:  ? Glucose, Bld 112 (*)   ? BUN 30 (*)   ? Creatinine, Ser 1.71 (*)   ? Calcium 8.5 (*)   ? GFR, Estimated 40 (*)   ? All other components within normal limits  ?PREPARE RBC (CROSSMATCH)  ?TYPE AND SCREEN  ? ? ? ?EKG ? ? ? ? ?RADIOLOGY ? ? ? ?PROCEDURES: ? ?Critical Care performed: Critical care time about half an hour.  This includes evaluating the patient and speaking with Dr. Raliegh Ip at [facility] urologist at [facility] that is twice.  I also had to get consent from the patient and his wife for the transfusion and speak to the hospitalist and our urologist as well. ? ?Procedures ? ? ?MEDICATIONS ORDERED IN ED: ?Medications  ?0.9 %  sodium chloride infusion (has no administration in time range)  ?sodium chloride irrigation 0.9 % 3,000 mL (has no administration in time range)  ? ? ? ?IMPRESSION / MDM / ASSESSMENT AND PLAN /  ED COURSE  ?I reviewed the triage vital signs and the nursing notes. ?I spoke to Dr. Zigmund Daniel urology on call at [facility].  She reports his hemoglobin was 10.3 about a week ago.  She wants [place] to transfuse him here and do continuous bladder irrigation.  She does not think with his hemoglobin dropping that much that they would be out of see enough to do the cystoscopy as it is.  I have ordered a transfusion after getting consent from him and his wife.  I will also consult urology as well as internal medicine hospitalist. ?Dr. Zigmund Daniel feels the cystoscopy will have to be rescheduled. ? ?Dr. Caprice Beaver does not want to do continuous bladder irrigation right now.  He remembers that the patient had a catheter put in recently and within a half an hour drove down to [facility] to get it pulled out.  We will try to let him continue to urinate.  We will transfuse him I did get consent.  We will get him admitted for this.  Patient reports he has not had Xarelto for 2 days prior to his scheduled cystoscopy and did not take his aspirin this morning either. ? ? ? ?FINAL CLINICAL IMPRESSION(S) / ED DIAGNOSES  ? ?Final diagnoses:  ?Gross hematuria  ?Hypotension, unspecified hypotension type  ?Malignant neoplasm of urinary bladder, unspecified site [facility])  ? ? ? ?Rx / DC Orders  ? ?ED Discharge Orders   ? ? None  ? ?  ? ? ? ?Note:  This document was prepared using Dragon voice recognition software and may include unintentional dictation errors. ?  ?Nena Polio, MD ?09/26/21 [phone] ? ?  ?Nena Polio, MD ?09/26/21 [phone] ? ?

## 2021-09-27 DIAGNOSIS — C679 Malignant neoplasm of bladder, unspecified: Secondary | ICD-10-CM

## 2021-09-27 DIAGNOSIS — R310 Gross hematuria: Secondary | ICD-10-CM

## 2021-09-27 DIAGNOSIS — D62 Acute posthemorrhagic anemia: Secondary | ICD-10-CM

## 2021-09-27 DIAGNOSIS — R339 Retention of urine, unspecified: Secondary | ICD-10-CM

## 2021-09-27 DIAGNOSIS — F802 Mixed receptive-expressive language disorder: Secondary | ICD-10-CM

## 2021-09-27 DIAGNOSIS — I480 Paroxysmal atrial fibrillation: Secondary | ICD-10-CM | POA: Diagnosis present

## 2021-09-27 LAB — CBC
HCT: 23.6 % — ABNORMAL LOW (ref 39.0–52.0)
HCT: 23.9 % — ABNORMAL LOW (ref 39.0–52.0)
HCT: 26.4 % — ABNORMAL LOW (ref 39.0–52.0)
Hemoglobin: 7.8 g/dL — ABNORMAL LOW (ref 13.0–17.0)
Hemoglobin: 7.9 g/dL — ABNORMAL LOW (ref 13.0–17.0)
Hemoglobin: 8.6 g/dL — ABNORMAL LOW (ref 13.0–17.0)
MCH: 31.1 pg (ref 26.0–34.0)
MCH: 31.3 pg (ref 26.0–34.0)
MCH: 31.4 pg (ref 26.0–34.0)
MCHC: 33.1 g/dL (ref 30.0–36.0)
MCHC: 33.1 g/dL (ref 30.0–36.0)
MCHC: 32.6 g/dL (ref 30.0–36.0)
MCV: 94 fL (ref 80.0–100.0)
MCV: 94.8 fL (ref 80.0–100.0)
MCV: 96.4 fL (ref 80.0–100.0)
Platelets: 124 10*3/uL — ABNORMAL LOW (ref 150–400)
Platelets: 129 10*3/uL — ABNORMAL LOW (ref 150–400)
Platelets: 153 10*3/uL (ref 150–400)
RBC: 2.51 MIL/uL — ABNORMAL LOW (ref 4.22–5.81)
RBC: 2.52 MIL/uL — ABNORMAL LOW (ref 4.22–5.81)
RBC: 2.74 MIL/uL — ABNORMAL LOW (ref 4.22–5.81)
RDW: 18.8 % — ABNORMAL HIGH (ref 11.5–15.5)
RDW: 19 % — ABNORMAL HIGH (ref 11.5–15.5)
RDW: 19 % — ABNORMAL HIGH (ref 11.5–15.5)
WBC: 4.5 10*3/uL (ref 4.0–10.5)
WBC: 5 10*3/uL (ref 4.0–10.5)
WBC: 5 10*3/uL (ref 4.0–10.5)
nRBC: 0 % (ref 0.0–0.2)
nRBC: 0 % (ref 0.0–0.2)
nRBC: 0 % (ref 0.0–0.2)

## 2021-09-27 LAB — BASIC METABOLIC PANEL
Anion gap: 5 (ref 5–15)
BUN: 22 mg/dL (ref 8–23)
CO2: 24 mmol/L (ref 22–32)
Calcium: 8.1 mg/dL — ABNORMAL LOW (ref 8.9–10.3)
Chloride: 110 mmol/L (ref 98–111)
Creatinine, Ser: 1.38 mg/dL — ABNORMAL HIGH (ref 0.61–1.24)
GFR, Estimated: 52 mL/min — ABNORMAL LOW (ref >60)
Glucose, Bld: 94 mg/dL (ref 70–99)
Potassium: 4 mmol/L (ref 3.5–5.1)
Sodium: 139 mmol/L (ref 135–145)

## 2021-09-27 LAB — HEMOGLOBIN AND HEMATOCRIT, BLOOD
HCT: 25.3 % — ABNORMAL LOW (ref 39.0–52.0)
Hemoglobin: 8.2 g/dL — ABNORMAL LOW (ref 13.0–17.0)

## 2021-09-27 MED ORDER — CHLORHEXIDINE GLUCONATE CLOTH 2 % EX PADS
6.0000 | MEDICATED_PAD | Freq: Every day | CUTANEOUS | Status: DC
  Administered 2021-09-27 – 2021-09-28 (×2): 6 via TOPICAL

## 2021-09-27 MED ORDER — SODIUM CHLORIDE 0.9 % IR SOLN
3000.0000 mL | Status: DC
  Administered 2021-09-27 – 2021-09-28 (×4): 3000 mL

## 2021-09-27 MED ORDER — MIDODRINE HCL 5 MG PO TABS
2.5000 mg | ORAL_TABLET | Freq: Three times a day (TID) | ORAL | Status: DC | PRN
  Administered 2021-09-27: 2.5 mg via ORAL
  Filled 2021-09-27: qty 1

## 2021-09-27 MED ORDER — SODIUM CHLORIDE 0.9 % IV SOLN: INTRAVENOUS | Status: DC

## 2021-09-27 MED ORDER — OXYBUTYNIN CHLORIDE 5 MG PO TABS
5.0000 mg | ORAL_TABLET | Freq: Three times a day (TID) | ORAL | Status: DC | PRN
  Administered 2021-09-27 – 2021-09-29 (×4): 5 mg via ORAL
  Filled 2021-09-27 (×4): qty 1

## 2021-09-27 MED ORDER — BACITRACIN-NEOMYCIN-POLYMYXIN 400-5-5000 EX OINT
1.0000 "application " | TOPICAL_OINTMENT | CUTANEOUS | Status: DC | PRN
  Filled 2021-09-27: qty 1

## 2021-09-27 NOTE — Hospital Course (Addendum)
Patient is an 81 year old male with past medical history of severe chronic systolic/diastolic heart failure with ejection fraction of 10%, DVT on Xarelto, and metastatic bladder cancer with numerous prior treatments including chemotherapy, radiation therapy and multiple TURBTs who has had worsening hematuria for the past few months and initially was planned to have an outpatient cystoscopy with [place] [facility] urology on 3/24 secondary to episodes of hypotension and anemia.  He presented to the emergency room on 09/2508 due to worsening hematuria along with dysuria.  In the emergency room, patient found to have hemoglobin of 6.5 (was 10.3 a week ago) and large pyuria.  Although following hydration, patient's hemoglobin came down to 5.9.  Patient was admitted to the hospitalist service and transfused 2 units of blood.  He was also started on IV Rocephin.  Urology consulted and after extensive discussion with patient and family, opting for a conservative management at first and avoiding long-term Foley catheter if possible.  Plan will be to hold patient's anticoagulants and observe closely.  Other labs of note were BNP of 1400 and creatinine of 1.7. ? ?Patient had Foley catheter placed by urology.  Following bladder irrigation, patient much more comfortable with minimal if any pain.  By 3/25, up to 8.6, but having some hypotension which did not improve with IV fluids by that afternoon.  By 3/26 morning, patient still hypotensive.  Passing moderate amount of clots with more significant hematuria.  Hemoglobin down to 7.5 although no change in renal function.  Transfused 1 unit packed red blood cells. ? ?On 3/27, urology removed catheter after noting that he hematuria much more dilute.  By 3/28, day of discharge, patient able to void with minimal discomfort.  Blood pressure remained stable and actually hemoglobin and creatinine with slight improvements. ?

## 2021-09-27 NOTE — Assessment & Plan Note (Addendum)
Continue amiodarone.  Anticoagulation has been discontinued. ?

## 2021-09-27 NOTE — Assessment & Plan Note (Addendum)
Multiple recurrences.  Status post radiation, chemo and TURBT's.  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  Patient has follow-up cystoscopy 4/5. ?

## 2021-09-27 NOTE — Progress Notes (Signed)
Urology Inpatient Progress Note ? ?Subjective: ?21 YOM with multiple medical problems and complex urologic history due to metastatic bladder cancer, followed at [facility].  History of gross hematuria for 2-3 months with recent worsening.  Admitted for anemia and hypotension. ?Chart reviewed and patient discussed with Dr. Diamantina Providence. ? ?He is complaining of increased SP pain this morning.  Unable to void.  He has had output of bloody urine overnight.   ?Bladder scan shows 450 ml. ?UOP 1385 ml in past 24 hours ?Hgb 7.9 this AM after 3 units of PRBCs ? ? ?Anti-infectives: ?Anti-infectives (From admission, onward)  ? ? Start     Dose/Rate Route Frequency Ordered Stop  ? 09/26/21 0830  cefTRIAXone (ROCEPHIN) 1 g in sodium chloride 0.9 % 100 mL IVPB       ? 1 g ?200 mL/hr over 30 Minutes Intravenous Every 24 hours 09/26/21 0828    ? ?  ? ? ?Current Facility-Administered Medications  ?Medication Dose Route Frequency Provider Last Rate Last Admin  ? acetaminophen (TYLENOL) tablet 650 mg  650 mg Oral Q6H PRN Ivor Costa, MD   650 mg at 09/27/21 0542  ? albuterol (PROVENTIL) (2.5 MG/3ML) 0.083% nebulizer solution 3 mL  3 mL Nebulization Q4H PRN Ivor Costa, MD      ? amiodarone (PACERONE) tablet 100 mg  100 mg Oral Daily Ivor Costa, MD   100 mg at 09/27/21 1050  ? atorvastatin (LIPITOR) tablet 20 mg  20 mg Oral QPM Ivor Costa, MD   20 mg at 09/26/21 1629  ? cefTRIAXone (ROCEPHIN) 1 g in sodium chloride 0.9 % 100 mL IVPB  1 g Intravenous Q24H Ivor Costa, MD 200 mL/hr at 09/27/21 0900 1 g at 09/27/21 0900  ? dextromethorphan-guaiFENesin ([place] DM) 30-600 MG per 12 hr tablet 1 tablet  1 tablet Oral BID PRN Ivor Costa, MD      ? docusate sodium (COLACE) capsule 100-200 mg  100-200 mg Oral BID Ivor Costa, MD   100 mg at 09/27/21 1050  ? furosemide (LASIX) injection 20 mg  20 mg Intravenous Once Ivor Costa, MD      ? furosemide (LASIX) tablet 20 mg  20 mg Oral BID Ivor Costa, MD      ? hydrocortisone cream 1 %   Topical Daily PRN Ivor Costa, MD      ? morphine (PF) 2 MG/ML injection 0.5 mg  0.5 mg Intravenous Q3H PRN Ivor Costa, MD      ? ondansetron [facility]) injection 4 mg  4 mg Intravenous Q8H PRN Ivor Costa, MD      ? oxybutynin (DITROPAN) tablet 5 mg  5 mg Oral TID PRN Purvi Ruehl, Reece Leader., MD      ? oxyCODONE-acetaminophen (PERCOCET/ROXICET) 5-325 MG per tablet 1 tablet  1 tablet Oral Q6H PRN Ivor Costa, MD   1 tablet at 09/27/21 9518  ? polyethylene glycol (MIRALAX / GLYCOLAX) packet 17 g  17 g Oral Daily PRN Ivor Costa, MD   17 g at 09/27/21 1052  ? sodium chloride irrigation 0.9 % 3,000 mL  3,000 mL Irrigation Continuous Ngoc Daughtridge, Reece Leader., MD      ? ? ? ?Objective: ?Vital signs in last 24 hours: ?Temp:  [97.6 ?F (36.4 ?C)-98.4 ?F (36.9 ?C)] 97.9 ?F (36.6 ?C) (03/25 0755) ?Pulse Rate:  [60-75] 66 (03/25 0755) ?Resp:  [16-19] 16 (03/25 0755) ?BP: (83-95)/(51-67) 94/61 (03/25 0755) ?SpO2:  [96 %-100 %] 96 % (03/25 0755) ?Weight:  [75.6 kg] 75.6 kg (03/25 0500) ? ?  Intake/Output from previous day: ?03/24 0701 - 03/25 0700 ?In: 1073.9 [Blood:1073.9] ?Out: 1395 [[id number]] ?Intake/Output this shift: ?No intake/output data recorded. ? ?GENERAL APPEARANCE:  Well appearing, well developed, well nourished, NAD ?ABDOMEN:  Soft, tender in SP area with some distention ?EXTREMITIES:  Moves all extremities well, without clubbing, cyanosis, or edema ?NEUROLOGIC:  Alert and oriented x 3, CN II-XII grossly intact ?MENTAL STATUS:  appropriate ?BACK:  Non-tender to palpation, No CVAT ?SKIN:  Warm, dry, and intact ?GU:  external catheter in place with bloody urine in bag, no urine in tubing ? ? ?Lab Results:  ?Recent Labs  ?  09/27/21 ?8299 09/27/21 ?0724  ?WBC 4.5 5.0  ?HGB 7.8* 7.9*  ?HCT 23.6* 23.9*  ?PLT 124* 129*  ? ?BMET ?Recent Labs  ?  09/26/21 ?0500 09/27/21 ?3716  ?NA 141 139  ?K 3.9 4.0  ?CL 107 110  ?CO2 28 24  ?GLUCOSE 112* 94  ?BUN 30* 22  ?CREATININE 1.71* 1.38*  ?CALCIUM 8.5* 8.1*  ? ?PT/INR ?Recent Labs  ?  09/26/21 ?0947  ?LABPROT 15.1   ?INR 1.2  ? ?ABG ?No results for input(s): PHART, HCO3 in the last 72 hours. ? ?Invalid input(s): PCO2, PO2 ? ?Studies/Results: ?No results found. ? ? ?Assessment & Plan: ?Gross hematuria ?Urinary retention ?Metastatic bladder cancer ?Anemia ? ?I discussed the current situation with the patient and his wife this morning.  He is in urinary retention and is symptomatic.  I recommended placement of a Foley catheter and possible continuous bladder irrigation.  The patient and his wife agreed.  We also discussed that he is very high risk surgical candidate surgical intervention given his cardiac history and ejection fraction of 10 to 15% and preference for nonsurgical options if possible. ? ?3 way Foley catheter placement/bladder irrigation/CBI ?Under sterile conditions, a 22 French hematuria catheter was placed without difficulty.  There was return of approximately 500 mL of bloody urine.  The catheter was irrigated with approximately 1 L of sterile water with return of multiple clots.  Irrigation was continued until the urine was light pink.  Continuous bladder irrigation was started.  The urine was light pink in color without obvious clots at this time.  The patient tolerated the procedure well. ? ?Recommendations: ?Continue Foley and CBI to keep urine clear and avoid clot formation. ?Continue to hold Xarelto and aspirin ?Resuscitation and transfusion per hospitalist ?Oxybutynin ordered.  Unfortunately, B&O suppositories are unavailable per pharmacy. ?Urology will follow. ? ? ?Michaelle Birks, MD ?09/27/2021 ? ?

## 2021-09-27 NOTE — Assessment & Plan Note (Addendum)
Fortunately, despite blood loss, renal function at baseline.  Creatinine remained stable around 1.37 with GFR 52.  With additional blood given 3/26, renal function today improved and has been stable around 1.25. ?

## 2021-09-27 NOTE — Assessment & Plan Note (Signed)
Advanced heart failure.  Ejection fraction of 10%.  He has a chronic defibrillator.  Recheck BNP in the morning. ?

## 2021-09-27 NOTE — Assessment & Plan Note (Signed)
Secondary to CVA.  Patient able to speak clearly, but at times, has trouble with a few word findings and also understanding other people.  Wife acts sometimes as Optometrist. ?

## 2021-09-27 NOTE — Assessment & Plan Note (Addendum)
Had Foley catheter placed on admission with irrigation.  Secondary to combination of being on anticoagulation plus bladder cancer.  Urine culture is unremarkable.  Urology removed catheter on 3/27.  Patient has been off [place] since 3/22.  Following catheter removal, after 24 hours, patient able to void with minimal discomfort.  Hemoglobin, renal function and blood pressure remained stable.  Urology here has spoken with patient's urologist at [facility] and they plan to do a cystoscopy on him on 4/5. ?

## 2021-09-27 NOTE — Assessment & Plan Note (Addendum)
Transfusing as needed.  Received 2 units packed red blood cells on admission and additional unit on 3/26.  Hemoglobin on day of discharge at 8.5, stable since transfusion on 3/26 ?

## 2021-09-27 NOTE — Progress Notes (Signed)
[facility] Progress Note ? ?Patient: Dalton Obrien    OVA:[id number]  DOA: 09/26/2021    ?Date of Service: the patient was seen and examined on 09/27/2021 ? ?Brief hospital course: ?Patient is an 81 year old male with past medical history of severe chronic systolic/diastolic heart failure with ejection fraction of 10%, DVT on Xarelto, and metastatic bladder cancer with numerous prior treatments including chemotherapy, radiation therapy and multiple TURBTs who has had worsening hematuria for the past few months and initially was planned to have an outpatient cystoscopy with [facility] on 3/24 secondary to episodes of hypotension and anemia.  He presented to the emergency room on 09/2508 due to worsening hematuria along with dysuria.  In the emergency room, patient found to have hemoglobin of 6.5 (was 10.3 a week ago) and large pyuria.  Although following hydration, patient's hemoglobin came down to 5.9.  Patient was admitted to the hospitalist service and transfused 2 units of blood.  He was also started on IV Rocephin.  Urology consulted and after extensive discussion with patient and family, opting for a conservative management at first and avoiding long-term Foley catheter if possible.  Plan will be to hold patient's anticoagulants and observe closely.  Other labs of note were BNP of 1400 and creatinine of 1.7. ? ?By 3/25 morning, hemoglobin up to 7.9.  Patient had Foley catheter placed by urology.  Following bladder irrigation, patient much more comfortable with minimal if any pain. ? ?Assessment and Plan: ?Assessment and Plan: ?* Hematuria ?For now, Foley catheter with irrigation.  Secondary to combination of being on anticoagulation plus bladder cancer.  Urology following.  Follow urine cultures. ? ?Anemia due to acute blood loss ?Transfusing as needed.  Posttransfusion, hemoglobin up to 8.6.  Follow-up hemoglobin in the morning. ? ?Bladder cancer ([place]) ?Multiple recurrences.  Status post radiation,  chemo and TURBT's.  Urology following ? ?Chronic systolic CHF (congestive heart failure) ([place]) ?Advanced heart failure.  Ejection fraction of 10%.  He has a chronic defibrillator.  Recheck BNP in the morning. ? ?Chronic kidney disease, stage 3a ([place]) ?Fortunately, despite blood loss, renal function at baseline.  Creatinine today at 1.38 with GFR 52.  Continue to follow closely. ? ?Paroxysmal atrial fibrillation ([place]) ?Continue amiodarone.  Anticoagulation on hold. ? 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  Patient able to speak clearly, but at times, has trouble with a few word findings and also understanding other people.  Wife acts sometimes as Optometrist. ? ? ? ? ? ? ?Body mass index is 22.6 kg/m?.  ?  ?   ? ?Consultants: ?Urology ? ?Procedures: ?Status post 2 unit packed red blood cell transfusion ?Placed Foley catheter with bladder irrigation ? ?Antimicrobials: ?IV Rocephin 3/24-present ? ?Code Status: Full code ? ? ?Subjective: Patient doing okay, no complaints. ? ?Objective: ?Vital signs were reviewed and unremarkable. ?Vitals:  ? 09/27/21 0755 09/27/21 1150  ?BP: 94/61 90/63  ?Pulse: 66 63  ?Resp: 16 18  ?Temp: 97.9 ?F (36.6 ?C) 97.8 ?F (36.6 ?C)  ?SpO2: 96% 95%  ? ? ?Intake/Output Summary (Last 24 hours) at 09/27/2021 1342 ?Last data filed at 09/27/2021 1157 ?Gross per 24 hour  ?Intake 937.92 ml  ?Output 2975 ml  ?Net -2037.08 ml  ? ?Filed Weights  ? 09/26/21 0449 09/27/21 0500  ?Weight: 74.6 kg 75.6 kg  ? ?Body mass index is 22.6 kg/m?. ? ?Exam: ? ?General: Alert and oriented x3, no acute distress ?HEENT: Normocephalic, atraumatic, mucous membranes are moist ?Cardiovascular: Regular rate and rhythm, S1-S2, 2  out of 6 systolic ejection murmur ?Respiratory: Clear to auscultation bilaterally ?Abdomen: Soft, nontender, nondistended, positive bowel sounds ?Musculoskeletal: No clubbing or cyanosis, trace pitting edema ?Skin: No skin breaks, tears or lesions ?Psychiatry: Appropriate, no evidence of  psychoses ?Neurology: No focal deficits ? ?Data Reviewed: ?Renal function stable with creatinine of 1.38.  Hemoglobin up to 8.6. ? ?Disposition:  ?Status is: Inpatient ?Remains inpatient appropriate because: Ensuring no further hematuria and tolerating catheter ?  ? ?Anticipated discharge date: 3/27 ? ?Remaining issues to be resolved so that patient can be discharged: Clearance by urology.  Hematuria resolution.  Decision on long-term Foley catheter. ? ? ?Family Communication: Wife at the bedside. ?DVT Prophylaxis: ?SCDs Start: 09/26/21 0818 ? ? ? ?Author: ?Annita Brod ,MD ?09/27/2021 1:42 PM ? ?To reach On-call, see care teams to locate the attending and reach out via [email or web address]. ?Between 7PM-7AM, please contact night-coverage ?If you still have difficulty reaching the attending provider, please page the [facility] (Director on Call) for [facility] on amion for assistance. ? ?

## 2021-09-28 DIAGNOSIS — R339 Retention of urine, unspecified: Secondary | ICD-10-CM

## 2021-09-28 DIAGNOSIS — I959 Hypotension, unspecified: Secondary | ICD-10-CM | POA: Diagnosis present

## 2021-09-28 DIAGNOSIS — I5023 Acute on chronic systolic (congestive) heart failure: Secondary | ICD-10-CM | POA: Diagnosis present

## 2021-09-28 LAB — HEMOGLOBIN AND HEMATOCRIT, BLOOD
HCT: 26.3 % — ABNORMAL LOW (ref 39.0–52.0)
Hemoglobin: 8.5 g/dL — ABNORMAL LOW (ref 13.0–17.0)

## 2021-09-28 LAB — TYPE AND SCREEN
ABO/RH(D): B POS
Antibody Screen: NEGATIVE

## 2021-09-28 LAB — PREPARE RBC (CROSSMATCH)

## 2021-09-28 LAB — CBC
HCT: 23.7 % — ABNORMAL LOW (ref 39.0–52.0)
Hemoglobin: 7.5 g/dL — ABNORMAL LOW (ref 13.0–17.0)
MCH: 31.1 pg (ref 26.0–34.0)
MCHC: 31.6 g/dL (ref 30.0–36.0)
MCV: 98.3 fL (ref 80.0–100.0)
Platelets: 136 10*3/uL — ABNORMAL LOW (ref 150–400)
RBC: 2.41 MIL/uL — ABNORMAL LOW (ref 4.22–5.81)
RDW: 18 % — ABNORMAL HIGH (ref 11.5–15.5)
WBC: 7 10*3/uL (ref 4.0–10.5)
nRBC: 0 % (ref 0.0–0.2)

## 2021-09-28 LAB — URINE CULTURE: Culture: <10000 — AB

## 2021-09-28 LAB — BASIC METABOLIC PANEL
Anion gap: 6 (ref 5–15)
BUN: 23 mg/dL (ref 8–23)
CO2: 24 mmol/L (ref 22–32)
Calcium: 7.9 mg/dL — ABNORMAL LOW (ref 8.9–10.3)
Chloride: 109 mmol/L (ref 98–111)
Creatinine, Ser: 1.37 mg/dL — ABNORMAL HIGH (ref 0.61–1.24)
GFR, Estimated: 52 mL/min — ABNORMAL LOW (ref >60)
Glucose, Bld: 99 mg/dL (ref 70–99)
Potassium: 4.2 mmol/L (ref 3.5–5.1)
Sodium: 139 mmol/L (ref 135–145)

## 2021-09-28 LAB — BRAIN NATRIURETIC PEPTIDE: B Natriuretic Peptide: 1426.5 pg/mL — ABNORMAL HIGH (ref 0.0–100.0)

## 2021-09-28 MED ORDER — MIDODRINE HCL 5 MG PO TABS
2.5000 mg | ORAL_TABLET | Freq: Three times a day (TID) | ORAL | Status: DC
  Administered 2021-09-28 – 2021-09-30 (×6): 2.5 mg via ORAL
  Filled 2021-09-28 (×6): qty 1

## 2021-09-28 MED ORDER — FUROSEMIDE 10 MG/ML IJ SOLN: 20.0000 mg | Freq: Once | INTRAMUSCULAR | Status: DC

## 2021-09-28 MED ORDER — SODIUM CHLORIDE 0.9% IV SOLUTION: Freq: Once | INTRAVENOUS | Status: AC

## 2021-09-28 NOTE — Progress Notes (Signed)
Urology Inpatient Progress Note ? ?Subjective: ?Continues on CBI.  Urine cranberry colored.  He reports intermittent bladder spasms.  Nursing staff hand irrigated foley this AM due to clots. ?Receiving 1 U PRBCs today for Hgb down to 7.5.   ?Urine culture: <10K colonies ? ?Anti-infectives: ?Anti-infectives (From admission, onward)  ? ? Start     Dose/Rate Route Frequency Ordered Stop  ? 09/26/21 0830  cefTRIAXone (ROCEPHIN) 1 g in sodium chloride 0.9 % 100 mL IVPB       ? 1 g ?200 mL/hr over 30 Minutes Intravenous Every 24 hours 09/26/21 0828    ? ?  ? ? ?Current Facility-Administered Medications  ?Medication Dose Route Frequency Provider Last Rate Last Admin  ? acetaminophen (TYLENOL) tablet 650 mg  650 mg Oral Q6H PRN Ivor Costa, MD   650 mg at 09/27/21 0542  ? albuterol (PROVENTIL) (2.5 MG/3ML) 0.083% nebulizer solution 3 mL  3 mL Nebulization Q4H PRN Ivor Costa, MD      ? amiodarone (PACERONE) tablet 100 mg  100 mg Oral Daily Ivor Costa, MD   100 mg at 09/28/21 8182  ? atorvastatin (LIPITOR) tablet 20 mg  20 mg Oral QPM Ivor Costa, MD   20 mg at 09/27/21 1749  ? cefTRIAXone (ROCEPHIN) 1 g in sodium chloride 0.9 % 100 mL IVPB  1 g Intravenous Q24H Ivor Costa, MD 200 mL/hr at 09/28/21 0808 1 g at 09/28/21 9937  ? Chlorhexidine Gluconate Cloth 2 % PADS 6 each  6 each Topical Daily Annita Brod, MD   6 each at 09/28/21 1659  ? dextromethorphan-guaiFENesin ([place] DM) 30-600 MG per 12 hr tablet 1 tablet  1 tablet Oral BID PRN Ivor Costa, MD      ? docusate sodium (COLACE) capsule 100-200 mg  100-200 mg Oral BID Ivor Costa, MD   100 mg at 09/28/21 1696  ? furosemide (LASIX) injection 20 mg  20 mg Intravenous Once Ivor Costa, MD      ? furosemide (LASIX) injection 20 mg  20 mg Intravenous Once Annita Brod, MD      ? furosemide (LASIX) tablet 20 mg  20 mg Oral BID Ivor Costa, MD      ? hydrocortisone cream 1 %   Topical Daily PRN Ivor Costa, MD      ? midodrine (PROAMATINE) tablet 2.5 mg  2.5 mg Oral TID  PRN Annita Brod, MD   2.5 mg at 09/27/21 1749  ? midodrine (PROAMATINE) tablet 2.5 mg  2.5 mg Oral TID WC Annita Brod, MD      ? morphine (PF) 2 MG/ML injection 0.5 mg  0.5 mg Intravenous Q3H PRN Ivor Costa, MD      ? neomycin-bacitracin-polymyxin (NEOSPORIN) ointment packet 1 application.  1 application. Topical PRN Lathyn Griggs, Reece Leader., MD      ? ondansetron [facility]) injection 4 mg  4 mg Intravenous Q8H PRN Ivor Costa, MD      ? oxybutynin (DITROPAN) tablet 5 mg  5 mg Oral TID PRN Primus Bravo., MD   5 mg at 09/28/21 1524  ? oxyCODONE-acetaminophen (PERCOCET/ROXICET) 5-325 MG per tablet 1 tablet  1 tablet Oral Q6H PRN Ivor Costa, MD   1 tablet at 09/28/21 7893  ? polyethylene glycol (MIRALAX / GLYCOLAX) packet 17 g  17 g Oral Daily PRN Ivor Costa, MD   17 g at 09/27/21 1052  ? sodium chloride irrigation 0.9 % 3,000 mL  3,000 mL Irrigation Continuous Alesi Zachery, Reece Leader., MD  3,000 mL at 09/28/21 0330  ? ? ? ?Objective: ?Vital signs in last 24 hours: ?Temp:  [97.7 ?F (36.5 ?C)-98.5 ?F (36.9 ?C)] 98 ?F (36.7 ?C) (03/26 1602) ?Pulse Rate:  [59-69] 65 (03/26 1602) ?Resp:  [14-18] 16 (03/26 1602) ?BP: (83-98)/(55-66) 89/59 (03/26 1602) ?SpO2:  [97 %-100 %] 98 % (03/26 1602) ?Weight:  [75.2 kg] 75.2 kg (03/26 0500) ? ?Intake/Output from previous day: ?03/25 0701 - 03/26 0700 ?In: 4052.5 [P.O.:240; I.V.:612.5; IV Piggyback:200] ?Out: 00174 [BSWHQ:[id number]] ?Intake/Output this shift: ?Total I/O ?In: 42 [P.O.:240; Blood:360; Other:6000; IV Piggyback:100] ?Out: 9700 [Urine:9700] ? ?Physical Exam ? ?Lab Results:  ?Recent Labs  ?  09/27/21 ?1310 09/27/21 ?1953 09/28/21 ?3846 09/28/21 ?1451  ?WBC 5.0  --  7.0  --   ?HGB 8.6*   < > 7.5* 8.5*  ?HCT 26.4*   < > 23.7* 26.3*  ?PLT 153  --  136*  --   ? < > = values in this interval not displayed.  ? ?BMET ?Recent Labs  ?  09/27/21 ?6599 09/28/21 ?3570  ?NA 139 139  ?K 4.0 4.2  ?CL 110 109  ?CO2 24 24  ?GLUCOSE 94 99  ?BUN 22 23  ?CREATININE 1.38* 1.37*   ?CALCIUM 8.1* 7.9*  ? ?PT/INR ?Recent Labs  ?  09/26/21 ?0947  ?LABPROT 15.1  ?INR 1.2  ? ?ABG ?No results for input(s): PHART, HCO3 in the last 72 hours. ? ?Invalid input(s): PCO2, PO2 ? ?Studies/Results: ?No results found. ? ? ?Assessment & Plan: ?Gross hematuria ?Urinary retention ?Metastatic bladder cancer ?Anemia ? ?I irrigated the foley with return of small clots. ? ?Continue CBI for now. ?Continue to hold anti-coagulation ?Transfuse prn ? ?I had a discussion with the patient and his wife about his current condition.  I discussed that currently our goal is to see an improvement in his hematuria and hopefully be able to remove his foley and allow him to resume voiding. I advised them that he will need to follow-up with Dr. Manuella Ghazi at [facility] for cystoscopy and possible surgical management with TURBT/fulguration of bleeding.  Given his significant co-morbidities, it would be best to have any necessary surgical procedures performed at [facility].   ? ? ? ?Michaelle Birks, MD ?09/28/2021 ? ?

## 2021-09-28 NOTE — Assessment & Plan Note (Addendum)
BNP even prior to getting blood at 1400.  Patient with ejection fraction of 10%.  Blood pressures have been soft so unable to diurese.  Status post defibrillator.  He likely needs some moderate degree of heart failure given that very poor ejection fraction. ?

## 2021-09-28 NOTE — Assessment & Plan Note (Addendum)
Patient is chronically hypotensive and I suspect this is less from blood loss from his hematuria and more from his end-stage heart failure.  Tolerating low-dose midodrine.  His blood pressures remain on the low end which is likely where he lives.  His cardiologist in follow-up to determine if he needs to stay on midodrine. ?

## 2021-09-28 NOTE — Progress Notes (Signed)
[facility] Progress Note ? ?Patient: Dalton Obrien    ZOX:[id number]  DOA: 09/26/2021    ?Date of Service: the patient was seen and examined on 09/28/2021 ? ?Brief hospital course: ?Patient is an 81 year old male with past medical history of severe chronic systolic/diastolic heart failure with ejection fraction of 10%, DVT on Xarelto, and metastatic bladder cancer with numerous prior treatments including chemotherapy, radiation therapy and multiple TURBTs who has had worsening hematuria for the past few months and initially was planned to have an outpatient cystoscopy with [place] [facility] on 3/24 secondary to episodes of hypotension and anemia.  He presented to the emergency room on 09/2508 due to worsening hematuria along with dysuria.  In the emergency room, patient found to have hemoglobin of 6.5 (was 10.3 a week ago) and large pyuria.  Although following hydration, patient's hemoglobin came down to 5.9.  Patient was admitted to the hospitalist service and transfused 2 units of blood.  He was also started on IV Rocephin.  Urology consulted and after extensive discussion with patient and family, opting for a conservative management at first and avoiding long-term Foley catheter if possible.  Plan will be to hold patient's anticoagulants and observe closely.  Other labs of note were BNP of 1400 and creatinine of 1.7. ? ?Patient had Foley catheter placed by urology.  Following bladder irrigation, patient much more comfortable with minimal if any pain.  By 3/25, up to 8.6, but having some hypotension which did not improve with IV fluids by that afternoon.  By 3/26 morning, patient still hypotensive.  Passing moderate amount of clots with more significant hematuria.  Hemoglobin down to 7.5 although no change in renal function.  Transfuse 1 unit packed red blood cells, although blood pressure still staying low with systolic in the high 81X.  Patient appears to be asymptomatic from the hypotension. ? ?Assessment and  Plan: ?Assessment and Plan: ?* Hematuria ?For now, Foley catheter with irrigation.  Secondary to combination of being on anticoagulation plus bladder cancer.  Urology following.  Follow urine cultures.  Hematuria worse today.  At this point now, patient has been off of Xarelto now since 3/22. ? ?Anemia due to acute blood loss ?Transfusing as needed.  Posttransfusion, hemoglobin up to 8.6, down to 7.5 today.  Transfusing 1 additional packed red blood cells to try to keep hemoglobin above 8 given coronary artery history. ? ?Bladder cancer ([place]) ?Multiple recurrences.  Status post radiation, chemo and TURBT's.  Urology following ? ?Hypotension ?Patient is chronically hypotensive and I suspect this is less from blood loss from his hematuria and more from his end-stage heart failure.  Trying a little midodrine if possible.  If no improvement, will get cardiology on board. ? ?Acute on chronic systolic CHF (congestive heart failure) ([place]) ?BNP even prior to getting blood at 1400.  Patient with ejection fraction of 10%.  Blood pressures have been soft so unable to diurese.  Status post defibrillator. ? ?Chronic systolic CHF (congestive heart failure) ([place]) ?Advanced heart failure.  Ejection fraction of 10%.  He has a chronic defibrillator.  Recheck BNP in the morning. ? ?Chronic kidney disease, stage 3a ([place]) ?Fortunately, despite blood loss, renal function at baseline.  Creatinine remained stable around 1.37 with GFR 52.  Continue to follow closely. ? ?Paroxysmal atrial fibrillation ([place]) ?Continue amiodarone.  Anticoagulation on hold. ? 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  Patient able to speak clearly, but at times, has trouble with a few word findings and also understanding other people.  Wife acts sometimes as Optometrist. ? ? ? ? ? ? ?Body mass index is 22.48 kg/m?.  ?  ?   ? ?Consultants: ?Urology ? ?Procedures: ?Status post total of 3 unit packed red blood cell transfusion ?Placed Foley catheter with bladder  irrigation ? ?Antimicrobials: ?IV Rocephin 3/24-present ? ?Code Status: Full code ? ? ?Subjective: Patient was complaining of pain earlier from passing clots.  Better now. ? ?Objective: ?Vital signs were reviewed and unremarkable. ?Vitals:  ? 09/28/21 1109 09/28/21 1357  ?BP: (!) 83/59 (!) 87/55  ?Pulse: 64 69  ?Resp: 16 17  ?Temp: 98.2 ?F (36.8 ?C) 98 ?F (36.7 ?C)  ?SpO2: 99% 99%  ? ? ?Intake/Output Summary (Last 24 hours) at 09/28/2021 1425 ?Last data filed at 09/28/2021 1354 ?Gross per 24 hour  ?Intake 4212.48 ml  ?Output 15075 ml  ?Net -10862.52 ml  ? ? ?Filed Weights  ? 09/26/21 0449 09/27/21 0500 09/28/21 0500  ?Weight: 74.6 kg 75.6 kg 75.2 kg  ? ?Body mass index is 22.48 kg/m?. ? ?Exam: ? ?General: Alert and oriented x3, no acute distress ?HEENT: Normocephalic, atraumatic, mucous membranes are moist ?Cardiovascular: Regular rate and rhythm, S1-S2, 2 out of 6 systolic ejection murmur ?Respiratory: Clear to auscultation bilaterally ?Abdomen: Soft, nontender, nondistended, positive bowel sounds ?Musculoskeletal: No clubbing or cyanosis, trace pitting edema ?Skin: No skin breaks, tears or lesions ?Psychiatry: Appropriate, no evidence of psychoses ?Neurology: No focal deficits ? ?Data Reviewed: ?Noted drop in hemoglobin ? ?Disposition:  ?Status is: Inpatient ?Remains inpatient appropriate because: Ensuring no further hematuria and tolerating catheter ?  ? ?Anticipated discharge date: 3/29 ? ?Remaining issues to be resolved so that patient can be discharged: Clearance by urology.  Hematuria resolution.  Decision on long-term Foley catheter. ? ? ?Family Communication: Wife at the bedside. ?DVT Prophylaxis: ?SCDs Start: 09/26/21 0818 ? ? ? ?Author: ?Annita Brod ,MD ?09/28/2021 2:25 PM ? ?To reach On-call, see care teams to locate the attending and reach out via [email or web address]. ?Between 7PM-7AM, please contact night-coverage ?If you still have difficulty reaching the attending provider, please page the [facility] (Director  on Call) for [facility] on amion for assistance. ? ?

## 2021-09-29 DIAGNOSIS — I9589 Other hypotension: Secondary | ICD-10-CM

## 2021-09-29 DIAGNOSIS — I5023 Acute on chronic systolic (congestive) heart failure: Secondary | ICD-10-CM

## 2021-09-29 LAB — TYPE AND SCREEN
ABO/RH(D): B POS
Antibody Screen: NEGATIVE
Unit division: 0
Unit division: 0
Unit division: 0
Unit division: 0

## 2021-09-29 LAB — BASIC METABOLIC PANEL
Anion gap: 6 (ref 5–15)
BUN: 20 mg/dL (ref 8–23)
CO2: 24 mmol/L (ref 22–32)
Calcium: 8 mg/dL — ABNORMAL LOW (ref 8.9–10.3)
Chloride: 110 mmol/L (ref 98–111)
Creatinine, Ser: 1.27 mg/dL — ABNORMAL HIGH (ref 0.61–1.24)
GFR, Estimated: 57 mL/min — ABNORMAL LOW (ref >60)
Glucose, Bld: 99 mg/dL (ref 70–99)
Potassium: 4.1 mmol/L (ref 3.5–5.1)
Sodium: 140 mmol/L (ref 135–145)

## 2021-09-29 LAB — BPAM RBC
Blood Product Expiration Date: 202304152359
Blood Product Expiration Date: 202304152359
Blood Product Expiration Date: 202304212359
Blood Product Expiration Date: 202304212359
ISSUE DATE / TIME: 202303241035
ISSUE DATE / TIME: 202303241344
ISSUE DATE / TIME: 202303242327
ISSUE DATE / TIME: 202303261045
Unit Type and Rh: 7300
Unit Type and Rh: 7300
Unit Type and Rh: 7300
Unit Type and Rh: 7300

## 2021-09-29 LAB — CBC
HCT: 25.1 % — ABNORMAL LOW (ref 39.0–52.0)
Hemoglobin: 8.1 g/dL — ABNORMAL LOW (ref 13.0–17.0)
MCH: 31.2 pg (ref 26.0–34.0)
MCHC: 32.3 g/dL (ref 30.0–36.0)
MCV: 96.5 fL (ref 80.0–100.0)
Platelets: 127 10*3/uL — ABNORMAL LOW (ref 150–400)
RBC: 2.6 MIL/uL — ABNORMAL LOW (ref 4.22–5.81)
RDW: 16.5 % — ABNORMAL HIGH (ref 11.5–15.5)
WBC: 5.7 10*3/uL (ref 4.0–10.5)
nRBC: 0 % (ref 0.0–0.2)

## 2021-09-29 NOTE — Progress Notes (Addendum)
? ?  Subjective ?Denies pain currently, severe pain when having bladder spasms/clots ? ?Physical Exam: ?BP (!) 94/55 (BP Location: Right Arm)   Pulse 64   Temp 98.3 ?F (36.8 ?C) (Oral)   Resp 18   Ht 6' (1.829 m)   Wt 81.7 kg   SpO2 95%   BMI 24.43 kg/m?   ? ?Constitutional:  Alert and oriented, No acute distress. ?Respiratory: Normal respiratory effort, no increased work of breathing. ?GI: Abdomen is soft, non-tender, non-distended ?Drains: 22 French three-way Foley with light pink urine on slow CBI ? ?The catheter was uncapped and irrigated with 500 mL of NS with return of minimal <60m of old clot.  Urine cleared rapidly. ? ?30 mL was removed from the balloon, and the catheter removed. ? ?Laboratory Data: ?Reviewed ? ?Assessment & Plan:   ?81year old male with history of muscle invasive and suspected [place], as well as multiple TURBTs, most recent in April 2022.  History of stroke on anticoagulation with Xarelto and aspirin, as well as cardiomyopathy with ejection fraction of 10 to 15%.  Imaging on 09/18/2021 showed posterior bladder wall thickening concerning for recurrence.  He has had 2 to 3 months of worsening gross hematuria [facility].  Xarelto was held starting 09/25/2021.  Three-way Foley was placed on 09/27/2021 and bladder irrigated and started on CBI.  On the morning of 09/29/2021 urine was light pink on slow CBI, irrigated easily to clear with minimal clot, and the catheter was removed.  Either bladder cancer recurrence or radiation cystitis most likely etiology of gross hematuria based on his recent imaging. ? ?I have also [facility] ? ?Okay for general diet, push fluids ?Continue to hold anticoagulants ?Urology will continue to follow ?Goal is for discharge home with close follow-up this  week with his [facility] urologist Dr. SManuella Ghazi? ?A total of 30 minutes was spent on the floor with greater than 50% spent in counseling and coordination of care with the patient and his wife regarding muscle invasive metastatic bladder cancer with history of chemoradiation, anticoagulants, cardiomyopathy, stroke, gross hematuria, and treatment options. ? ?BBilley Co MD ? ? ? ?

## 2021-09-29 NOTE — Progress Notes (Signed)
? ?  Voiding pink urine spontaneously throughout the day, force of stream has improved this afternoon.  Minimal clots.  Pain is also improved.  If continues to void spontaneously with reasonable PVR, okay for discharge from urology perspective tomorrow. ? ?I have tried to get a hold of his urologist Dr. Manuella Ghazi at [facility] today to schedule close follow-up ideally this week, and potentially reschedule clinic cystoscopy with him that was originally scheduled for last week, but have not been able to get through.  I left a voicemail, and will try to contact him again tomorrow morning. ? ?Urology will continue to follow ? ?Billey Co, MD ? ? ? ?

## 2021-09-29 NOTE — Care Management Important Message (Signed)
Important Message ? ?Patient Details  ?Name: Dalton Obrien ?MRN: [id number] ?Date of Birth: [birth year] ? ? ?Medicare Important Message Given:  N/A - LOS <3 / Initial given by admissions ? ? ? ? ?Juliann Pulse A [name] ?09/29/2021, 7:21 AM ?

## 2021-09-29 NOTE — Progress Notes (Signed)
[facility] Progress Note ? ?Patient: Dalton Obrien    IEP:[id number]  DOA: 09/26/2021    ?Date of Service: the patient was seen and examined on 09/29/2021 ? 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  He presented to the emergency room on 09/2508 due to worsening hematuria along with dysuria.  In the emergency room, patient found to have hemoglobin of 6.5 (was 10.3 a week ago) and large pyuria.  Although following hydration, patient's hemoglobin came down to 5.9.  Patient was admitted to the hospitalist service and transfused 2 units of blood.  He was also started on IV Rocephin.  Urology consulted and after extensive discussion with patient and family, opting for a conservative management at first and avoiding long-term Foley catheter if possible.  Plan will be to hold patient's anticoagulants and observe closely.  Other labs of note were BNP of 1400 and creatinine of 1.7. ? ?Patient had Foley catheter placed by urology.  Following bladder irrigation, patient much more comfortable with minimal if any pain.  By 3/25, up to 8.6, but having some hypotension which did not improve with IV fluids by that afternoon.  By 3/26 morning, patient still hypotensive.  Passing moderate amount of clots with more significant hematuria.  Hemoglobin down to 7.5 although no change in renal function.  Transfused 1 unit packed red blood cells. ? ?On 3/27, urology removed catheter after noting that he hematuria much more dilute.  Post catheter removal, patient at times passes a small clot  which causes a brief instance of pain and spasm, but no permanent pain or inability to void. ? ?Assessment and Plan: ?Assessment and Plan: ?* Hematuria ?Had Foley catheter placed on admission with irrigation.  Secondary to combination of being on anticoagulation plus bladder cancer.  Urine culture is unremarkable.  Urology removed catheter on 3/27.  Patient has been off [place] since 3/22.  Plan will be to monitor patient for another 24 hours and assuming that he is able to tolerate current state with expectation that he will continue to pass clots painfully, but no further obstruction and he can tolerate this plus stabilization of hemoglobin, blood pressure and renal function, plan for discharge on 3/28. ? ?Anemia due to acute blood loss ?Transfusing as needed.  Received 2 units packed red blood cells on admission and additional unit on 3/26.  Hemoglobin on 3/27 at 8.1. ? ?Bladder cancer ([place]) ?Multiple recurrences.  Status post radiation, chemo and TURBT's.  Urology following ? ?Hypotension ?Patient is chronically hypotensive and I suspect this is less from blood loss from his hematuria and more from his end-stage heart failure.  Tolerating low-dose midodrine. ? ?Acute on chronic systolic CHF (congestive heart failure) ([place]) ?BNP even prior to getting blood at 1400.  Patient with ejection fraction of 10%.  Blood pressures have been soft so unable to diurese.  Status post defibrillator. ? ?Chronic systolic CHF (congestive heart failure) ([place]) ?Advanced heart failure.  Ejection fraction of 10%.  He has a chronic defibrillator.  Recheck BNP in the morning. ? ?Chronic kidney disease, stage 3a ([place]) ?Fortunately, despite blood loss, renal function at baseline.  Creatinine remained stable around 1.37 with GFR 52.  With additional blood given 3/26, renal function today much better at 1.27 with GFR of 57. ? ?Paroxysmal atrial fibrillation (HCC) ?Continue amiodarone.  Anticoagulation on hold. ? 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  Patient able to speak clearly, but at times, has trouble with a few word findings and also understanding other people.  Wife acts sometimes as Optometrist. ? ? ? ? ? ? ?Body mass index is 24.43 kg/m?.  ?  ?   ? ?Consultants: ?Urology ? ?Procedures: ?Status post total of 3 unit packed red blood cell transfusion ?Placed Foley catheter with bladder irrigation ? ?Antimicrobials: ?IV Rocephin 3/24-present ? ?Code Status: Full code ? ? ?Subjective: Episodic pain episodes when passing small clots even with dripping urine.  Pain goes up to as high as a 9/10, but then comes back down. ? ?Objective: ?Vital signs were reviewed and unremarkable. ?Vitals:  ? 09/29/21 0809 09/29/21 1123  ?BP: (!) 94/55 (!) 89/57  ?Pulse: 64 67  ?Resp: 18 20  ?Temp: 98.3 ?F (36.8 ?C) 98.5 ?F (36.9 ?C)  ?SpO2: 95% 100%  ? ? ?Intake/Output Summary (Last 24 hours) at 09/29/2021 1343 ?Last data filed at 09/29/2021 1638 ?Gross per 24 hour  ?Intake 12460 ml  ?Output 14075 ml  ?Net -1615 ml  ? ?Filed Weights  ? 09/27/21 0500 09/28/21 0500 09/29/21 0429  ?Weight: 75.6 kg 75.2 kg 81.7 kg  ? ?Body mass index is 24.43 kg/m?. ? ?Exam: ? ?General: Alert and oriented x3, no acute distress ?HEENT: Normocephalic, atraumatic, mucous membranes are moist ?Cardiovascular: Regular rate and rhythm, S1-S2, 2 out of 6 systolic ejection murmur ?Respiratory: Clear to auscultation bilaterally ?Abdomen: Soft, nontender, nondistended, positive bowel sounds ?Musculoskeletal: No clubbing or cyanosis, trace pitting edema ?Skin: No skin breaks, tears or lesions ?Psychiatry: Appropriate, no evidence of psychoses ?Neurology: No focal deficits ? ?Data Reviewed: ?Labs today note slight improvement in creatinine at 1.27 with GFR 57 and hemoglobin of 8.1 ? ?Disposition:  ?Status is: Inpatient ?Remains inpatient appropriate because: 24 hours more of monitoring ?  ? ?Anticipated discharge date: 3/ 28 ? ?Remaining issues to be resolved so that patient can be  discharged: Monitor for 24 more hours.  Ensure renal function, hemoglobin and blood pressure remain stable and patient able to tolerate without catheter ? ? ?Family Communication: Wife at the bedside. ?DVT Prophylaxis: ?SCDs Start: 09/26/21 0818 ? ? ? ?Author: ?Annita Brod ,MD ?09/29/2021 1:43 PM ? ?To reach On-call, see care teams to locate the attending and reach out via [email or web address]. ?Between 7PM-7AM, please contact night-coverage ?If you still have difficulty reaching the attending provider, please page the [facility] (Director on Call) for [facility] on amion for assistance. ? ?

## 2021-09-30 DIAGNOSIS — I959 Hypotension, unspecified: Secondary | ICD-10-CM

## 2021-09-30 DIAGNOSIS — D649 Anemia, unspecified: Secondary | ICD-10-CM

## 2021-09-30 LAB — CBC
HCT: 26.5 % — ABNORMAL LOW (ref 39.0–52.0)
Hemoglobin: 8.5 g/dL — ABNORMAL LOW (ref 13.0–17.0)
MCH: 31.7 pg (ref 26.0–34.0)
MCHC: 32.1 g/dL (ref 30.0–36.0)
MCV: 98.9 fL (ref 80.0–100.0)
Platelets: 135 10*3/uL — ABNORMAL LOW (ref 150–400)
RBC: 2.68 MIL/uL — ABNORMAL LOW (ref 4.22–5.81)
RDW: 16.3 % — ABNORMAL HIGH (ref 11.5–15.5)
WBC: 5.9 10*3/uL (ref 4.0–10.5)
nRBC: 0 % (ref 0.0–0.2)

## 2021-09-30 LAB — BASIC METABOLIC PANEL
Anion gap: 5 (ref 5–15)
BUN: 18 mg/dL (ref 8–23)
CO2: 27 mmol/L (ref 22–32)
Calcium: 8.3 mg/dL — ABNORMAL LOW (ref 8.9–10.3)
Chloride: 108 mmol/L (ref 98–111)
Creatinine, Ser: 1.25 mg/dL — ABNORMAL HIGH (ref 0.61–1.24)
GFR, Estimated: 58 mL/min — ABNORMAL LOW (ref >60)
Glucose, Bld: 101 mg/dL — ABNORMAL HIGH (ref 70–99)
Potassium: 4.5 mmol/L (ref 3.5–5.1)
Sodium: 140 mmol/L (ref 135–145)

## 2021-09-30 MED ORDER — MIDODRINE HCL 2.5 MG PO TABS: 2.5000 mg | ORAL_TABLET | Freq: Three times a day (TID) | ORAL | 1 refills | Status: DC

## 2021-09-30 MED ORDER — MIDODRINE HCL 2.5 MG PO TABS: 2.5000 mg | ORAL_TABLET | Freq: Three times a day (TID) | ORAL | 1 refills | Status: AC

## 2021-09-30 NOTE — Progress Notes (Signed)
Urology Consult Follow Up ? ?Subjective: ?Patient resting comfortably in bed with wife at bedside.  He has voided this morning at 6 AM and has saved the urine in urinal.  It is cranberry in color without clots.  He also voided spontaneously at 7 AM and stated the urine was the same color and passed two 5 mm clots.  He is not having discomfort when he urinates, he states it is a good stream and he feels he is emptying his bladder.  His belly is soft.  ? ?He is wanting to ambulate this morning.  ? ?VSS afebrile ? ?Hemoglobin and hematocrit relatively stable at 8.5 and 26.5% respectively.  BMP pending at the time of this note.  ? ?Anti-infectives: ?Anti-infectives (From admission, onward)  ? ? Start     Dose/Rate Route Frequency Ordered Stop  ? 09/26/21 0830  cefTRIAXone (ROCEPHIN) 1 g in sodium chloride 0.9 % 100 mL IVPB       ? 1 g ?200 mL/hr over 30 Minutes Intravenous Every 24 hours 09/26/21 0828    ? ?  ? ? ?Current Facility-Administered Medications  ?Medication Dose Route Frequency Provider Last Rate Last Admin  ? acetaminophen (TYLENOL) tablet 650 mg  650 mg Oral Q6H PRN Ivor Costa, MD   650 mg at 09/27/21 0542  ? albuterol (PROVENTIL) (2.5 MG/3ML) 0.083% nebulizer solution 3 mL  3 mL Nebulization Q4H PRN Ivor Costa, MD      ? amiodarone (PACERONE) tablet 100 mg  100 mg Oral Daily Ivor Costa, MD   100 mg at 09/29/21 4496  ? atorvastatin (LIPITOR) tablet 20 mg  20 mg Oral QPM Ivor Costa, MD   20 mg at 09/29/21 1812  ? cefTRIAXone (ROCEPHIN) 1 g in sodium chloride 0.9 % 100 mL IVPB  1 g Intravenous Q24H Ivor Costa, MD   Stopped at 09/29/21 [phone]  ? Chlorhexidine Gluconate Cloth 2 % PADS 6 each  6 each Topical Daily Annita Brod, MD   6 each at 09/28/21 1659  ? dextromethorphan-guaiFENesin ([place] DM) 30-600 MG per 12 hr tablet 1 tablet  1 tablet Oral BID PRN Ivor Costa, MD      ? docusate sodium (COLACE) capsule 100-200 mg  100-200 mg Oral BID Ivor Costa, MD   100 mg at 09/29/21 2122  ? furosemide (LASIX)  injection 20 mg  20 mg Intravenous Once Ivor Costa, MD      ? furosemide (LASIX) injection 20 mg  20 mg Intravenous Once Annita Brod, MD      ? furosemide (LASIX) tablet 20 mg  20 mg Oral BID Ivor Costa, MD      ? hydrocortisone cream 1 %   Topical Daily PRN Ivor Costa, MD      ? midodrine (PROAMATINE) tablet 2.5 mg  2.5 mg Oral TID PRN Annita Brod, MD   2.5 mg at 09/27/21 1749  ? midodrine (PROAMATINE) tablet 2.5 mg  2.5 mg Oral TID WC Annita Brod, MD   2.5 mg at 09/29/21 1811  ? morphine (PF) 2 MG/ML injection 0.5 mg  0.5 mg Intravenous Q3H PRN Ivor Costa, MD      ? neomycin-bacitracin-polymyxin (NEOSPORIN) ointment packet 1 application.  1 application. Topical PRN Stoneking, Reece Leader., MD      ? ondansetron [facility]) injection 4 mg  4 mg Intravenous Q8H PRN Ivor Costa, MD      ? oxybutynin (DITROPAN) tablet 5 mg  5 mg Oral TID PRN Stoneking, Reece Leader., MD  5 mg at 09/29/21 0427  ? oxyCODONE-acetaminophen (PERCOCET/ROXICET) 5-325 MG per tablet 1 tablet  1 tablet Oral Q6H PRN Ivor Costa, MD   1 tablet at 09/28/21 2831  ? polyethylene glycol (MIRALAX / GLYCOLAX) packet 17 g  17 g Oral Daily PRN Ivor Costa, MD   17 g at 09/27/21 1052  ? sodium chloride irrigation 0.9 % 3,000 mL  3,000 mL Irrigation Continuous Primus Bravo., MD   3,000 mL at 09/28/21 2104  ? ? ? ?Objective: ?Vital signs in last 24 hours: ?Temp:  [97.8 ?F (36.6 ?C)-98.7 ?F (37.1 ?C)] 97.8 ?F (36.6 ?C) (03/28 0430) ?Pulse Rate:  [62-67] 63 (03/28 0430) ?Resp:  [16-20] 19 (03/28 0430) ?BP: (86-94)/(51-62) 92/57 (03/28 0430) ?SpO2:  [95 %-100 %] 95 % (03/28 0430) ?Weight:  [77.4 kg] 77.4 kg (03/28 0354) ? ?Intake/Output from previous day: ?03/27 0701 - 03/28 0700 ?In: 580 [P.O.:480; IV Piggyback:100] ?Out: 175 [Urine:175] ?Intake/Output this shift: ?No intake/output data recorded. ? ? ?Physical Exam ?Constitutional:   ?   Appearance: Normal appearance. He is normal weight.  ?HENT:  ?   Head: Normocephalic and atraumatic.  ?    Nose: Nose normal.  ?   Mouth/Throat:  ?   Mouth: Mucous membranes are moist.  ?Pulmonary:  ?   Effort: Pulmonary effort is normal.  ?Abdominal:  ?   General: Abdomen is flat. There is no distension.  ?   Palpations: Abdomen is soft.  ?Musculoskeletal:     ?   General: Normal range of motion.  ?   Cervical back: Normal range of motion.  ?Skin: ?   General: Skin is warm and dry.  ?Neurological:  ?   General: No focal deficit present.  ?   Mental Status: He is alert and oriented to person, place, and time.  ?Psychiatric:     ?   Mood and Affect: Mood normal.     ?   Behavior: Behavior normal.     ?   Thought Content: Thought content normal.     ?   Judgment: Judgment normal.  ? ? ?Lab Results:  ?Recent Labs  ?  09/29/21 ?0604 09/30/21 ?5176  ?WBC 5.7 5.9  ?HGB 8.1* 8.5*  ?HCT 25.1* 26.5*  ?PLT 127* 135*  ? ?BMET ?Recent Labs  ?  09/28/21 ?1607 09/29/21 ?0604  ?NA 139 140  ?K 4.2 4.1  ?CL 109 110  ?CO2 24 24  ?GLUCOSE 99 99  ?BUN 23 20  ?CREATININE 1.37* 1.27*  ?CALCIUM 7.9* 8.0*  ? ?PT/INR ?No results for input(s): LABPROT, INR in the last 72 hours. ?ABG ?No results for input(s): PHART, HCO3 in the last 72 hours. ? ?Invalid input(s): PCO2, PO2 ? ?Studies/Results: ?No results found. ? ? ?Assessment and Plan: ?81 year old male with muscle invasive and suspected metastatic bladder cancer managed at [facility], with a history of stroke and was on Xarelto and aspirin previously until this admission and cardiomyopathy with an ejection fraction of 10 to 15% admitted for gross hematuria with significant anemia requiring transfusion.   ? ?Hemoglobin hematocrit have remained stable over the last several days. ? ?Hematuria was managed with CBI until yesterday when it was discontinued. ? ?Hematuria is most likely secondary to either bladder cancer recurrence or radiation cystitis. ? ?Recommendations: ?-Continue to push fluids to keep urine clear and with minimal clot-darker urine the morning likely due to concentrated urine  overnight ?-Continue to hold anticoagulants at discharge ?-Message out to Dr. Angela Nevin urology and hopefully will  hear from them today to coordinate follow-up upon discharge ?-If continues to void spontaneously with minimal clot, without pain and reasonable PVR, he would be stable for discharge from urology perspective ? ? ? ? LOS: 4 days  ? ? ?Rachyl Wuebker ?09/30/2021  ?

## 2021-09-30 NOTE — Discharge Summary (Signed)
?Physician Discharge Summary ?  ?Patient: Dalton Obrien MRN: [id number] DOB: 03-27-1941  ?Admit date:     09/26/2021  ?Discharge date: 09/30/21  ?Discharge Physician: Annita Brod  ? ?PCP: [place] Pink, MD  ? ?Recommendations at discharge:  ? ?Medication change: Xarelto discontinued ?New medication: Midodrine 2.5 mg p.o. 3 times daily ?Patient's urologist, Dr. Manuella Ghazi, at [facility], plans for patient to have cystoscopy on 4/5.  Their office will call patient for exact details ? ?Discharge Diagnoses: ?Principal Problem: ?  Hematuria ?Active Problems: ?  Anemia due to acute blood loss ?  Bladder cancer ([place]) ?  Acute on chronic systolic CHF (congestive heart failure) ([place]) ?  Hypotension ?  Chronic kidney disease, stage 3a ([place]) ?  Paroxysmal atrial fibrillation (HCC) ?  DVT (deep venous thrombosis) ([place]) ?  Wernicke's dysphasia ?  HLD (hyperlipidemia) ?  Stroke [facility]) ?  Asthma ?  PVC (premature ventricular contraction) ? ?Resolved Problems: ?  * No resolved hospital problems. * ? ?Hospital Course: ?Patient is an 81 year old male with past medical history of severe chronic systolic/diastolic heart failure with ejection fraction of 10%, DVT on Xarelto, and metastatic bladder cancer with numerous prior treatments including chemotherapy, radiation therapy and multiple TURBTs who has had worsening hematuria for the past few months and initially was planned to have an outpatient cystoscopy with [facility] on 3/24 secondary to episodes of hypotension and anemia.  He presented to the emergency room on 09/2508 due to worsening hematuria along with dysuria.  In the emergency room, patient found to have hemoglobin of 6.5 (was 10.3 a week ago) and large pyuria.  Although following hydration, patient's hemoglobin came down to 5.9.  Patient was admitted to the hospitalist service and transfused 2 units of blood.  He was also started on IV Rocephin.  Urology consulted and after extensive discussion with patient and family, opting  for a conservative management at first and avoiding long-term Foley catheter if possible.  Plan will be to hold patient's anticoagulants and observe closely.  Other labs of note were BNP of 1400 and creatinine of 1.7. ? ?Patient had Foley catheter placed by urology.  Following bladder irrigation, patient much more comfortable with minimal if any pain.  By 3/25, up to 8.6, but having some hypotension which did not improve with IV fluids by that afternoon.  By 3/26 morning, patient still hypotensive.  Passing moderate amount of clots with more significant hematuria.  Hemoglobin down to 7.5 although no change in renal function.  Transfused 1 unit packed red blood cells. ? ?On 3/27, urology removed catheter after noting that he hematuria much more dilute.  By 3/28, day of discharge, patient able to void with minimal discomfort.  Blood pressure remained stable and actually hemoglobin and creatinine with slight improvements. ? ?Assessment and Plan: ?* Hematuria ?Had Foley catheter placed on admission with irrigation.  Secondary to combination of being on anticoagulation plus bladder cancer.  Urine culture is unremarkable.  Urology removed catheter on 3/27.  Patient has been off [place] since 3/22.  Following catheter removal, after 24 hours, patient able to void with minimal discomfort.  Hemoglobin, renal function and blood pressure remained stable.  Urology here has spoken with patient's urologist at [facility] and they plan to do a cystoscopy on him on 4/5. ? ?Anemia due to acute blood loss ?Transfusing as needed.  Received 2 units packed red blood cells on admission and additional unit on 3/26.  Hemoglobin on day of discharge at 8.5, stable since  transfusion on 3/26 ? ?Bladder cancer ([place]) ?Multiple recurrences.  Status post radiation, chemo and TURBT's.  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  Patient has follow-up cystoscopy 4/5. ? ?Hypotension ?Patient is chronically  hypotensive and I suspect this is less from blood loss from his hematuria and more from his end-stage heart failure.  Tolerating low-dose midodrine.  His blood pressures remain on the low end which is likely where he lives.  His cardiologist in follow-up to determine if he needs to stay on midodrine. ? ?Acute on chronic systolic CHF (congestive heart failure) ([place]) ?BNP even prior to getting blood at 1400.  Patient with ejection fraction of 10%.  Blood pressures have been soft so unable to diurese.  Status post defibrillator.  He likely needs some moderate degree of heart failure given that very poor ejection fraction. ? ?Chronic systolic CHF (congestive heart failure) ([place]) ?Advanced heart failure.  Ejection fraction of 10%.  He has a chronic defibrillator.  Recheck BNP in the morning. ? ?Chronic kidney disease, stage 3a ([place]) ?Fortunately, despite blood loss, renal function at baseline.  Creatinine remained stable around 1.37 with GFR 52.  With additional blood given 3/26, renal function today improved and has been stable around 1.25. ? ?Paroxysmal atrial fibrillation ([place]) ?Continue amiodarone.  Anticoagulation has been discontinued. ? 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  Patient able to speak clearly, but at times, has trouble with a few word findings and also understanding other people.  Wife acts sometimes as Optometrist. ? ? ? ? ?  ? ? ?Consultants: Urology ?Procedures performed: Status post total of 3 unit packed red blood cell transfusion ?Placement of Foley catheter with bladder irrigation on admission, removed 3/27 ?Disposition: Home ?Diet recommendation:  ?Discharge Diet Orders (From admission, onward)  ? ?  Start     Ordered  ? 09/30/21 0000  Diet - low sodium heart healthy       ? 09/30/21 1508  ? ?  ?  ? ?  ? ?Cardiac diet ?DISCHARGE MEDICATION: ?Allergies as of 09/30/2021   ? ?   Reactions  ? Phenazopyridine Rash  ? Severe and itchy  ? Penicillins Other (See Comments)  ? Urinary tract problems  ?  Pyridium [phenazopyridine Hcl] Other (See Comments)  ? ?  ? ?  ?Medication List  ?  ? ?STOP taking these medications   ? ?furosemide 20 MG tablet ?Commonly known as: Lasix ?  ?Xarelto 10 MG Tabs tablet ?Generic drug: rivaroxaban ?  ? ?  ? ?TAKE these medications   ? ?atorvastatin 20 MG tablet ?Commonly known as: LIPITOR ?Take 20 mg by mouth daily. ?  ?docusate sodium 100 MG capsule ?Commonly known as: COLACE ?Take 100-200 mg by mouth 2 (two) times daily. ?  ?hydrocortisone 2.5 % cream ?Apply topically. ?  ?midodrine 2.5 MG tablet ?Commonly known as: PROAMATINE ?Take 1 tablet (2.5 mg total) by mouth 3 (three) times daily with meals. ?  ?Pacerone 100 MG tablet ?Generic drug: amiodarone ?Take 100 mg by mouth daily. ?  ?polyethylene glycol 17 g packet ?Commonly known as: MIRALAX / GLYCOLAX ?Take 17 g by mouth daily. ?  ? ?  ? ? Follow-up Information   ? ? Ellen Henri, MD Follow up on 10/08/2021.   ?Specialty: Urology ?Why: [facility]rology at [facility] for cystoscopy for you next Wednesday, 4/5.  If you have not heard from them this Friday, call the office for exact information/details. ?Contact information: ?[facility] ?[employer]  [place] [address] ?[phone] ? ? ?  ?  ? ?  ?  ? ?  ? ?Discharge Exam: ?Filed Weights  ? 09/28/21 0500 09/29/21 0429 09/30/21 0354  ?Weight: 75.2 kg 81.7 kg 77.4 kg  ? ?General: Alert and oriented x3, no acute distress ?Cardiovascular: Regular rate and rhythm, S1-S2, 2 out of 6 systolic ejection murmur ?Lungs: Clear to auscultation bilaterally ? ?Condition at discharge: fair ? ?The results of significant diagnostics from this hospitalization (including imaging, microbiology, ancillary and laboratory) are listed below for reference.  ? ?Imaging Studies: ?No results found. ? ?Microbiology: ?Results for orders placed or performed during the hospital encounter of 09/26/21  ?Urine Culture     Status: Abnormal  ? Collection Time: 09/26/21  5:00 AM  ? Specimen: Urine, Random  ?Result Value Ref Range Status   ? Specimen Description   Final  ?  URINE, RANDOM ?Performed at [facility], [address] [place], [place] [address] ?  ? Special Requests   Final  ?  NONE ?Performed at [facility], 1

## 2021-09-30 NOTE — TOC Initial Note (Signed)
Transition of Care (TOC) - Initial/Assessment Note  ? ? ?Patient Details  ?Name: Dalton Obrien ?MRN: [id number] ?Date of Birth: [birth year] ? ?Transition of Care (TOC) CM/SW Contact:    ?Pete Pelt, RN ?Phone Number: ?09/30/2021, 4:17 PM ? ?Clinical Narrative:       Patient lives at home with his wife.  He states that wife assists him with care at home.  Up until last week he was playing golf.  He is able to walk independently.  His wife drives him to the doctor, or he drives himself.   ? ?At this time, patient declines toc needs.          ? ? ?Expected Discharge Plan: Home/Self Care ?Barriers to Discharge: Barriers Resolved ? ? ?Patient Goals and CMS Choice ?  ?  ?  ? ?Expected Discharge Plan and Services ?Expected Discharge Plan: Home/Self Care ?  ?Discharge Planning Services: CM Consult ?  ?Living arrangements for the past 2 months: [place] ?                ?  ?  ?  ?  ?  ?  ?  ?  ?  ?  ? ?Prior Living Arrangements/Services ?Living arrangements for the past 2 months: [place] ?Lives with:: Self, Spouse ?Patient language and need for interpreter reviewed:: Yes (no interpreter requied) ?       ?Need for Family Participation in Patient Care: Yes (Comment) ?  ?  ?Criminal Activity/Legal Involvement Pertinent to Current Situation/Hospitalization: No - Comment as needed ? ?Activities of Daily Living ?Home Assistive Devices/Equipment: None ?ADL Screening (condition at time of admission) ?Patient's cognitive ability adequate to safely complete daily activities?: Yes ?Is the patient deaf or have difficulty hearing?: No ?Does the patient have difficulty seeing, even when wearing glasses/contacts?: No ?Does the patient have difficulty concentrating, remembering, or making decisions?: Yes ?Patient able to express need for assistance with ADLs?: Yes ?Does the patient have difficulty dressing or bathing?: No ?Independently performs ADLs?: Yes (appropriate for developmental age) ?Does the patient have  difficulty walking or climbing stairs?: No ?Weakness of Legs: None ?Weakness of Arms/Hands: None ? ?Permission Sought/Granted ?Permission sought to share information with : Case Manager ?Permission granted to share information with : Yes, Verbal Permission Granted ?   ?   ?   ?   ? ?Emotional Assessment ?Appearance:: Appears stated age ?Attitude/Demeanor/Rapport: Gracious, Engaged ?Affect (typically observed): Pleasant, Appropriate ?Orientation: : Oriented to Self, Oriented to Place, Oriented to  Time, Oriented to Situation ?Alcohol / Substance Use: Not Applicable ?Psych Involvement: No (comment) ? ?Admission diagnosis:  Gross hematuria [R31.0] ?Hematuria [R31.9] ?Malignant neoplasm of urinary bladder, unspecified site [facility]) [C67.9] ?Hypotension, unspecified hypotension type [I95.9] ?Patient Active Problem List  ? Diagnosis Date Noted  ? Acute on chronic systolic CHF (congestive heart failure) ([place]) 09/28/2021  ? Hypotension 09/28/2021  ? Paroxysmal atrial fibrillation ([place]) 09/27/2021  ? Wernicke's dysphasia 09/27/2021  ? Hematuria 09/26/2021  ? Bladder cancer ([place]) 09/26/2021  ? HLD (hyperlipidemia) 09/26/2021  ? Stroke [facility]) 09/26/2021  ? Chronic systolic CHF (congestive heart failure) ([place]) 09/26/2021  ? Chronic kidney disease, stage 3a ([place]) 09/26/2021  ? Asthma 09/26/2021  ? Anemia due to acute blood loss 09/26/2021  ? DVT (deep venous thrombosis) ([place]) 09/26/2021  ? PVC (premature ventricular contraction) 09/26/2021  ? CVA (cerebral infarction) 08/21/2015  ? TIA (transient ischemic attack) 08/20/2015  ? Acute encephalopathy 08/20/2015  ? ?PCP:  Maryland Pink, MD ?Pharmacy:   ?  [id number] - [address], [place] [place] ?[place] ?[place] [address] ?Phone: [phone] Fax: [phone] ? ? ? ? ?Social Determinants of Health (SDOH) Interventions ?  ? ?Readmission Risk Interventions ?   ? View : No data to display.  ?  ?   ?  ? ? ? ?

## 2021-10-04 ENCOUNTER — Other Ambulatory Visit: Payer: Self-pay

## 2021-10-04 ENCOUNTER — Emergency Department
Admission: EM | Admit: 2021-10-04 | Discharge: 2021-10-04 | Disposition: A | Discharge status: Home / Self Care | Payer: Medicare Other | Attending: Emergency Medicine | Admitting: Emergency Medicine

## 2021-10-04 ENCOUNTER — Emergency Department: Payer: Medicare Other

## 2021-10-04 DIAGNOSIS — Z7901 Long term (current) use of anticoagulants: Secondary | ICD-10-CM | POA: Diagnosis not present

## 2021-10-04 DIAGNOSIS — R0602 Shortness of breath: Secondary | ICD-10-CM | POA: Diagnosis present

## 2021-10-04 DIAGNOSIS — I5043 Acute on chronic combined systolic (congestive) and diastolic (congestive) heart failure: Secondary | ICD-10-CM | POA: Insufficient documentation

## 2021-10-04 DIAGNOSIS — Z8551 Personal history of malignant neoplasm of bladder: Secondary | ICD-10-CM | POA: Insufficient documentation

## 2021-10-04 DIAGNOSIS — Z20822 Contact with and (suspected) exposure to covid-19: Secondary | ICD-10-CM | POA: Insufficient documentation

## 2021-10-04 DIAGNOSIS — I509 Heart failure, unspecified: Secondary | ICD-10-CM

## 2021-10-04 LAB — CBC
HCT: 26.1 % — ABNORMAL LOW (ref 39.0–52.0)
Hemoglobin: 8 g/dL — ABNORMAL LOW (ref 13.0–17.0)
MCH: 30.8 pg (ref 26.0–34.0)
MCHC: 30.7 g/dL (ref 30.0–36.0)
MCV: 100.4 fL — ABNORMAL HIGH (ref 80.0–100.0)
Platelets: 180 10*3/uL (ref 150–400)
RBC: 2.6 MIL/uL — ABNORMAL LOW (ref 4.22–5.81)
RDW: 15.6 % — ABNORMAL HIGH (ref 11.5–15.5)
WBC: 5.1 10*3/uL (ref 4.0–10.5)
nRBC: 0 % (ref 0.0–0.2)

## 2021-10-04 LAB — RESP PANEL BY RT-PCR (FLU A&B, COVID) ARPGX2
Influenza A by PCR: NEGATIVE
Influenza B by PCR: NEGATIVE
SARS Coronavirus 2 by RT PCR: NEGATIVE

## 2021-10-04 LAB — COMPREHENSIVE METABOLIC PANEL
ALT: 14 U/L (ref 0–44)
AST: 21 U/L (ref 15–41)
Albumin: 3 g/dL — ABNORMAL LOW (ref 3.5–5.0)
Alkaline Phosphatase: 59 U/L (ref 38–126)
Anion gap: 5 (ref 5–15)
BUN: 22 mg/dL (ref 8–23)
CO2: 24 mmol/L (ref 22–32)
Calcium: 8.3 mg/dL — ABNORMAL LOW (ref 8.9–10.3)
Chloride: 110 mmol/L (ref 98–111)
Creatinine, Ser: 1.34 mg/dL — ABNORMAL HIGH (ref 0.61–1.24)
GFR, Estimated: 54 mL/min — ABNORMAL LOW (ref >60)
Glucose, Bld: 102 mg/dL — ABNORMAL HIGH (ref 70–99)
Potassium: 5.1 mmol/L (ref 3.5–5.1)
Sodium: 139 mmol/L (ref 135–145)
Total Bilirubin: 0.7 mg/dL (ref 0.3–1.2)
Total Protein: 6.4 g/dL — ABNORMAL LOW (ref 6.5–8.1)

## 2021-10-04 LAB — PROTIME-INR
INR: 1.1 (ref 0.8–1.2)
Prothrombin Time: 14.3 seconds (ref 11.4–15.2)

## 2021-10-04 LAB — BRAIN NATRIURETIC PEPTIDE: B Natriuretic Peptide: 2831.3 pg/mL — ABNORMAL HIGH (ref 0.0–100.0)

## 2021-10-04 MED ORDER — FUROSEMIDE 10 MG/ML IJ SOLN
40.0000 mg | Freq: Once | INTRAMUSCULAR | Status: AC
  Administered 2021-10-04: 40 mg via INTRAVENOUS
  Filled 2021-10-04: qty 4

## 2021-10-04 NOTE — Discharge Instructions (Addendum)
Please resume your previous furosemide prescription. ? ?Follow-up with cardiology early this coming week with either Dr. Ubaldo Glassing or our CHF clinic whom I have made an urgent referral to. ? ?Please return right away if you develop return of shortness of breath, feel your symptoms are worsening, you develop chest pain, nausea, vomiting, fevers or other concerns arise ?

## 2021-10-04 NOTE — ED Triage Notes (Signed)
Pt via [place], per pt he has been short of breath and bilateral ankle swelling. Pt was recently taken off of his Lasix on Tuesday. Pt is A&Ox4 and NAD.  ?

## 2021-10-04 NOTE — ED Notes (Signed)
RN to bedside to hook pt back up to monitor.  Water given to pt and wife.  ?

## 2021-10-04 NOTE — ED Provider Notes (Signed)
? ?[facility] ?Provider Note ? ? ? Event Date/Time  ? First MD Initiated Contact with Patient 10/04/21 1207   ?  (approximate) ? ? ?History  ? ?Shortness of Breath ? ? ?HPI ? ?Dalton Obrien is a 81 y.o. male who on review of patient's discharge summary from March 28 of this year  past medical history of severe chronic systolic/diastolic heart failure with ejection fraction of 10%, DVT on Xarelto, and metastatic bladder cancer with numerous prior treatments including chemotherapy, radiation therapy and multiple TURBTs who has had worsening hematuria for the past few months and initially was planned to have an outpatient cystoscopy with [place] [facility] on 3/24 secondary to episodes of hypotension and anemia.  He presented to the emergency room on 09/2508 due to worsening hematuria along with dysuria.  In the emergency room, patient found to have hemoglobin of 6.5 (was 10.3 a week ago) and large pyuria.  Although following hydration, patient's hemoglobin came down to 5.9.  Patient was admitted to the hospitalist service and transfused 2 units of blood.  He was also started on IV Rocephin.  Urology consulted and after extensive discussion with patient and family, opting for a conservative management at first and avoiding long-term Foley catheter if possible.  Plan will be to hold patient's anticoagulants and observe closely.  Other labs of note were BNP of 1400 and creatinine of 1.7. ? ?At about 4 AM in the morning patient woke up and reports he started feeling a little bit "wheezing".  Also felt the need to sit up.  He is also noticed that his legs have both become swollen over the last approximately 2 days.  Reports that he stopped Xarelto but was also to stop taking Lasix.  He now has mild shortness of breath ? ?  ?No chest pain. ? ?Physical Exam  ? ?Triage Vital Signs: ?ED Triage Vitals [10/04/21 1207]  ?Enc Vitals Group  ?   BP   ?   Pulse   ?   Resp   ?   Temp   ?   Temp src   ?   SpO2   ?    Weight 170 lb (77.1 kg)  ?   Height 6' (1.829 m)  ?   Head Circumference   ?   Peak Flow   ?   Pain Score 0  ?   Pain Loc   ?   Pain Edu?   ?   Excl. in [place]?   ? ? ?Most recent vital signs: ?Vitals:  ? 10/04/21 1330 10/04/21 1434  ?BP: (!) 102/53 98/66  ?Pulse:  65  ?Resp: (!) 22 (!) 24  ?Temp:    ?SpO2:  98%  ? ? ? ?General: Awake, no distress.  ?CV:  Good peripheral perfusion.  Heart tones normal.  Occasional irregularity appears to be secondary to frequent PVCs ?Resp:  Normal effort.  Mild very faint crackles in the lung bases bilaterally.  Very slight tachypnea.  No noted accessory muscle use.  No oxygen requirement.  Speaks in sentences without difficulty ?Abd:  No distention.  ?Other:  2+ pitting lower extremity edema to the level of the shins bilaterally ? ? ?ED Results / Procedures / Treatments  ? ?Labs ?(all labs ordered are listed, but only abnormal results are displayed) ?Labs Reviewed  ?CBC - Abnormal; Notable for the following components:  ?    Result Value  ? RBC 2.60 (*)   ? Hemoglobin 8.0 (*)   ?  HCT 26.1 (*)   ? MCV 100.4 (*)   ? RDW 15.6 (*)   ? All other components within normal limits  ?BRAIN NATRIURETIC PEPTIDE - Abnormal; Notable for the following components:  ? B Natriuretic Peptide 2,831.3 (*)   ? All other components within normal limits  ?COMPREHENSIVE METABOLIC PANEL - Abnormal; Notable for the following components:  ? Glucose, Bld 102 (*)   ? Creatinine, Ser 1.34 (*)   ? Calcium 8.3 (*)   ? Total Protein 6.4 (*)   ? Albumin 3.0 (*)   ? GFR, Estimated 54 (*)   ? All other components within normal limits  ?RESP PANEL BY RT-PCR (FLU A&B, COVID) ARPGX2  ?PROTIME-INR  ? ? ? ?EKG ? ?Reviewed and interpreted by me at 1230 ?Ventricular paced rhythm with a bigeminy-like pattern.  No obvious acute ischemia. ?I have reviewed this EKG with Dr. Josefa Half of cardiology as well ? ? ?RADIOLOGY ?DG Chest Portable 1 View ? ?Result Date: 10/04/2021 ?CLINICAL DATA:  Shortness of breath. EXAM: PORTABLE CHEST 1  VIEW COMPARISON:  04/02/2016 FINDINGS: Cardiac enlargement with interval placement of biventricular pacemaker. Lungs demonstrate evidence of moderate edema and small bilateral pleural effusions. No pneumothorax, airspace consolidation or visible masses. Bony structures are unremarkable. IMPRESSION: Evidence of moderate pulmonary edema with small bilateral pleural effusions. Electronically Signed   By: Aletta Edouard M.D.   On: 10/04/2021 12:28   ? ? ?Personally viewed and interpreted the patient's chest x-ray.  Suspect mild to moderate pulmonary edema ? ? ? ?PROCEDURES: ? ?Critical Care performed: No ? ?Procedures ? ? ?MEDICATIONS ORDERED IN ED: ?Medications  ?furosemide (LASIX) injection 40 mg (40 mg Intravenous Given 10/04/21 1325)  ? ? ? ?IMPRESSION / MDM / ASSESSMENT AND PLAN / ED COURSE  ?I reviewed the triage vital signs and the nursing notes. ?             ?               ? ?Differential diagnosis includes, but is not limited to, CHF CHF exacerbation, volume overload, infection (though no leukocytosis or fever to noted, no clear infectious symptoms), volume overload, AKI, or other considerations are made. ? ?He has no chest pain and reports same symptoms with same presentation in the past.  By exam he does appear mild to moderately volume overloaded but does not have oxygen requirement.  Only mild increased work of breathing ? ? ? ?The patient is on the cardiac monitor to evaluate for evidence of arrhythmia and/or significant heart rate changes. ? ?Clinical Course as of 10/04/21 1500  ?Sat Oct 04, 2021  ?1247 Discussed patient's presentation cardiac history EKG today, clinical exam with Dr. Josefa Half of cardiology.  He advises at this time he would trial 40 mg of IV Lasix, monitor blood pressures and response to therapy.  Patient does have pulmonary edema on his chest x-ray as well.  Cardiology advises if patient improves feels improved after IV diuresis in the ED would consider discharge and placed on 40 mg  oral Lasix once daily and will be able to follow-up closely with Dr. Ubaldo Glassing.  Dr. Josefa Half will send message notifying Dr. Ubaldo Glassing [MQ]  ?  ?Clinical Course User Index ?[MQ] Delman Kitten, MD  ? ?----------------------------------------- ?3:00 PM on 10/04/2021 ?----------------------------------------- ?Offered patient observation admission for what appears to be CHF exacerbation, versus close outpatient follow-up and careful return precautions.  He and his wife both feel confident that he is much improved at this time.  He is ambulatory without assistance now.  Respiratory rate 20, normal even unlabored respirations, blood pressure 98/66 which is not unusual for this patient, heart rate 65 saturation 98% on room air.  He is well-appearing nontoxic without distress reports he is asymptomatic no longer having any dyspnea. ? ?Patient would like to discharge and follow-up closely with cardiology.  This appears quite appropriate.  He has furosemide at home already and does not need a prescription, will resume his previous regimen of 20 mg twice daily and follow-up closely with his cardiologist. ? ?Return precautions and treatment recommendations and follow-up discussed with the patient who is agreeable with the plan. ? ? ?FINAL CLINICAL IMPRESSION(S) / ED DIAGNOSES  ? ?Final diagnoses:  ?Acute on chronic congestive heart failure, unspecified heart failure type ([place])  ? ? ? ?Rx / DC Orders  ? ?ED Discharge Orders   ? ?      Ordered  ?  AMB referral to CHF clinic       ?Comments: CHF, seen in ED. Follow-up 1-2 days this week please  ? 10/04/21 1456  ? ?  ?  ? ?  ? ? ? ?Note:  This document was prepared using Dragon voice recognition software and may include unintentional dictation errors. ?  Delman Kitten, MD ?10/04/21 1501 ? ?

## 2021-10-04 NOTE — ED Notes (Signed)
Pt assisted to toilet & back to bed. ?

## 2021-10-04 NOTE — ED Triage Notes (Signed)
Pt states last night he started with [facility] with wheezing- pt also having swelling in his ankles- pt was taken off his lasix on Tuesday- pt walked to treatment room with no difficulty- pt states he was here recently for bleeding from tumor (pt has bladder cancer) ?

## 2021-10-04 NOTE — ED Notes (Signed)
[place], light green, blue, red top tubes sent to lab. ?

## 2021-10-04 NOTE — ED Notes (Signed)
Pt ready to leave. Signature pad not working. Pt verbal DC.  ?

## 2021-10-06 ENCOUNTER — Telehealth: Payer: Self-pay | Admitting: Family

## 2021-10-06 NOTE — Telephone Encounter (Signed)
Patients wife nancy returned our call in attempt to schedule patient for a new patient appointment and she plans to follow up with his cardiologist and stated that he has had heart failure for over 15 years and they do not feel that this clinic is necessary. ? ? ?Sruti Ayllon, NT ?

## 2021-10-06 NOTE — Telephone Encounter (Signed)
LVM with patient and spouse in attempt to schedule patient for a new patient CHF Clinic appointment after receiving a referral from his recent ED Visit. ? ? ? ? ?Annisten [place], NT ?

## 2021-11-03 DEATH — deceased

## 2022-03-16 ENCOUNTER — Ambulatory Visit: Payer: Medicare Other | Admitting: Dermatology
